# Patient Record
Sex: Female | Born: 1935 | Race: White | Hispanic: No | Marital: Single | Smoking: Never smoker
Health system: Southern US, Community
[De-identification: ages and names within clinical notes are randomized; demographics above are authoritative.]

## PROBLEM LIST (undated history)

## (undated) DIAGNOSIS — R6250 Unspecified lack of expected normal physiological development in childhood: Secondary | ICD-10-CM

## (undated) DIAGNOSIS — C801 Malignant (primary) neoplasm, unspecified: Secondary | ICD-10-CM

## (undated) DIAGNOSIS — I10 Essential (primary) hypertension: Secondary | ICD-10-CM

## (undated) DIAGNOSIS — K219 Gastro-esophageal reflux disease without esophagitis: Secondary | ICD-10-CM

## (undated) DIAGNOSIS — C50919 Malignant neoplasm of unspecified site of unspecified female breast: Secondary | ICD-10-CM

## (undated) DIAGNOSIS — E785 Hyperlipidemia, unspecified: Secondary | ICD-10-CM

## (undated) HISTORY — DX: Malignant neoplasm of unspecified site of unspecified female breast: C50.919

## (undated) HISTORY — DX: Hyperlipidemia, unspecified: E78.5

## (undated) HISTORY — DX: Essential (primary) hypertension: I10

## (undated) HISTORY — DX: Gastro-esophageal reflux disease without esophagitis: K21.9

## (undated) HISTORY — PX: SHOULDER SURGERY: SHX246

## (undated) HISTORY — PX: ABDOMINAL HYSTERECTOMY: SHX81

## (undated) HISTORY — DX: Unspecified lack of expected normal physiological development in childhood: R62.50

---

## 2007-08-02 ENCOUNTER — Ambulatory Visit: Payer: Self-pay | Admitting: Family Medicine

## 2007-08-07 ENCOUNTER — Ambulatory Visit: Payer: Self-pay | Admitting: Family Medicine

## 2007-09-18 ENCOUNTER — Ambulatory Visit: Payer: Self-pay | Admitting: Physician Assistant

## 2007-10-01 ENCOUNTER — Encounter: Payer: Self-pay | Admitting: Unknown Physician Specialty

## 2007-11-02 ENCOUNTER — Ambulatory Visit: Payer: Self-pay

## 2008-05-04 ENCOUNTER — Ambulatory Visit: Payer: Self-pay | Admitting: Internal Medicine

## 2008-06-05 ENCOUNTER — Ambulatory Visit: Payer: Self-pay | Admitting: Internal Medicine

## 2008-08-13 ENCOUNTER — Ambulatory Visit: Payer: Self-pay | Admitting: Family Medicine

## 2008-08-17 ENCOUNTER — Emergency Department: Payer: Self-pay | Admitting: Emergency Medicine

## 2008-08-31 ENCOUNTER — Ambulatory Visit: Payer: Self-pay | Admitting: Internal Medicine

## 2008-09-04 ENCOUNTER — Ambulatory Visit: Payer: Self-pay | Admitting: Family Medicine

## 2008-09-10 ENCOUNTER — Ambulatory Visit: Payer: Self-pay | Admitting: Family Medicine

## 2009-02-25 ENCOUNTER — Ambulatory Visit: Payer: Self-pay | Admitting: Gastroenterology

## 2009-03-30 ENCOUNTER — Ambulatory Visit: Payer: Self-pay | Admitting: Internal Medicine

## 2009-04-11 ENCOUNTER — Emergency Department: Payer: Self-pay | Admitting: Internal Medicine

## 2010-03-22 ENCOUNTER — Ambulatory Visit: Payer: Self-pay | Admitting: Internal Medicine

## 2010-09-10 ENCOUNTER — Ambulatory Visit: Payer: Self-pay | Admitting: Internal Medicine

## 2010-09-10 DIAGNOSIS — Z029 Encounter for administrative examinations, unspecified: Secondary | ICD-10-CM

## 2010-10-20 ENCOUNTER — Encounter: Payer: Self-pay | Admitting: Internal Medicine

## 2010-10-20 ENCOUNTER — Ambulatory Visit (INDEPENDENT_AMBULATORY_CARE_PROVIDER_SITE_OTHER): Payer: Medicare Other | Admitting: Internal Medicine

## 2010-10-20 VITALS — BP 162/101 | HR 102 | Temp 98.2°F | Resp 12 | Ht <= 58 in | Wt 85.5 lb

## 2010-10-20 DIAGNOSIS — R6250 Unspecified lack of expected normal physiological development in childhood: Secondary | ICD-10-CM

## 2010-10-20 DIAGNOSIS — D510 Vitamin B12 deficiency anemia due to intrinsic factor deficiency: Secondary | ICD-10-CM

## 2010-10-20 DIAGNOSIS — Z23 Encounter for immunization: Secondary | ICD-10-CM

## 2010-10-20 DIAGNOSIS — R269 Unspecified abnormalities of gait and mobility: Secondary | ICD-10-CM

## 2010-10-20 DIAGNOSIS — I10 Essential (primary) hypertension: Secondary | ICD-10-CM

## 2010-10-20 DIAGNOSIS — E039 Hypothyroidism, unspecified: Secondary | ICD-10-CM

## 2010-10-20 LAB — CBC WITH DIFFERENTIAL/PLATELET
Basophils Absolute: 0 10*3/uL (ref 0.0–0.1)
Eosinophils Absolute: 0.1 10*3/uL (ref 0.0–0.7)
HCT: 45.8 % (ref 36.0–46.0)
Hemoglobin: 15.5 g/dL — ABNORMAL HIGH (ref 12.0–15.0)
Lymphs Abs: 0.7 10*3/uL (ref 0.7–4.0)
MCHC: 33.9 g/dL (ref 30.0–36.0)
MCV: 102.3 fl — ABNORMAL HIGH (ref 78.0–100.0)
Monocytes Absolute: 0.6 10*3/uL (ref 0.1–1.0)
Neutro Abs: 4.7 10*3/uL (ref 1.4–7.7)
RDW: 12.9 % (ref 11.5–14.6)

## 2010-10-20 LAB — COMPREHENSIVE METABOLIC PANEL
ALT: 40 U/L — ABNORMAL HIGH (ref 0–35)
AST: 36 U/L (ref 0–37)
Creatinine, Ser: 0.7 mg/dL (ref 0.4–1.2)
GFR: 90.77 mL/min (ref >60.00)
Total Bilirubin: 1 mg/dL (ref 0.3–1.2)

## 2010-10-20 NOTE — Progress Notes (Signed)
Subjective:    Patient ID: Kathy Mckay, female    DOB: 08-05-1932, 77 y.o.   MRN: [id number]  HPI Kathy Mckay is a 77 year old female with a history of developmental delay who presents to establish care. She denies any complaints today. Her caregiver denies any complaints today. She reports good appetite and normal activity level. She denies any problems with her bowel or bladder. Her caregiver notes that she has some unsteadiness to her gait and uses her arms for balance. She requests the use of a wheelchair for traveling longer distances. She denies any recent falls.  Outpatient Encounter Prescriptions as of 10/20/2010  Medication Sig Dispense Refill  . atorvastatin (LIPITOR) 40 MG tablet Take 40 mg by mouth daily.        Marland Kitchen esomeprazole (NEXIUM) 40 MG capsule Take 40 mg by mouth daily.        . hydrochlorothiazide (HYDRODIURIL) 25 MG tablet Take 25 mg by mouth daily.        . vitamin B-12 (CYANOCOBALAMIN) 100 MCG tablet Take 50 mcg by mouth daily.          Review of Systems  Constitutional: Negative for fever, chills, appetite change, fatigue and unexpected weight change.  HENT: Negative for ear pain, congestion, sore throat, trouble swallowing, neck pain, voice change and sinus pressure.   Eyes: Negative for visual disturbance.  Respiratory: Negative for cough, shortness of breath, wheezing and stridor.   Cardiovascular: Negative for chest pain, palpitations and leg swelling.  Gastrointestinal: Negative for nausea, vomiting, abdominal pain, diarrhea, constipation, blood in stool, abdominal distention and anal bleeding.  Genitourinary: Negative for dysuria and flank pain.  Musculoskeletal: Negative for myalgias, arthralgias and gait problem.  Skin: Negative for color change and rash.  Neurological: Negative for dizziness and headaches.  Hematological: Negative for adenopathy. Does not bruise/bleed easily.  Psychiatric/Behavioral: Positive for confusion and decreased concentration. Negative  for suicidal ideas, sleep disturbance and dysphoric mood. The patient is not nervous/anxious.    BP 162/101  Pulse 102  Temp(Src) 98.2 F (36.8 C) (Oral)  Resp 12  Ht 4\' 10"  (1.473 m)  Wt 85 lb 8 oz (38.783 kg)  BMI 17.87 kg/m2  SpO2 100%     Objective:   Physical Exam  Constitutional: She is oriented to person, place, and time. She appears well-developed and well-nourished. No distress.  HENT:  Head: Normocephalic and atraumatic.  Right Ear: External ear normal.  Left Ear: External ear normal.  Nose: Nose normal.  Mouth/Throat: Oropharynx is clear and moist. No oropharyngeal exudate.  Eyes: Conjunctivae are normal. Pupils are equal, round, and reactive to light. Right eye exhibits no discharge. Left eye exhibits no discharge. No scleral icterus.  Neck: Normal range of motion. Neck supple. No tracheal deviation present. No thyromegaly present.  Cardiovascular: Normal rate, regular rhythm, normal heart sounds and intact distal pulses.  Exam reveals no gallop and no friction rub.   No murmur heard. Pulmonary/Chest: Effort normal and breath sounds normal. No respiratory distress. She has no wheezes. She has no rales. She exhibits no tenderness.  Abdominal: Soft. She exhibits no distension. There is no tenderness.  Musculoskeletal: Normal range of motion. She exhibits no edema and no tenderness.       Cervical back: She exhibits deformity (kyphosis).  Lymphadenopathy:    She has no cervical adenopathy.  Neurological: She is alert and oriented to person, place, and time. No cranial nerve deficit. She exhibits normal muscle tone. Coordination normal.  Skin: Skin is warm and  dry. No rash noted. She is not diaphoretic. No erythema. No pallor.  Psychiatric: She has a normal mood and affect. Her speech is normal and behavior is normal. Judgment and thought content normal. Cognition and memory are impaired. She exhibits abnormal recent memory and abnormal remote memory.            Assessment & Plan:  1. Hypertension -patient noted to have elevated blood pressure. We'll review her records from her previous physician. She is currently taking hydrochlorothiazide alone. We discussed adding an additional medication, but will check renal function first. She will return to clinic in 2 weeks.  2. Hyperlipidemia - Will check LFTs with labs today. Will get records on previous lipids. Continue lipitor.  3. Osteoporosis - Will get records on previous testing. Kyphosis noted on exam.   4. Developmental Delay - patient caregiver reports that patient has a legal guardian for the state. We will need to get records on this to confirm who is the decision-maker for this patient.  5. Gait disturbance - Pt is unsteady on feet. Will write for wheelchair for use as needed.

## 2010-11-04 ENCOUNTER — Encounter: Payer: Self-pay | Admitting: Internal Medicine

## 2010-11-04 ENCOUNTER — Ambulatory Visit (INDEPENDENT_AMBULATORY_CARE_PROVIDER_SITE_OTHER): Payer: Medicare Other | Admitting: Internal Medicine

## 2010-11-04 VITALS — BP 155/94 | HR 90 | Temp 97.4°F | Resp 14 | Ht <= 58 in | Wt 85.0 lb

## 2010-11-04 DIAGNOSIS — I10 Essential (primary) hypertension: Secondary | ICD-10-CM

## 2010-11-04 DIAGNOSIS — R42 Dizziness and giddiness: Secondary | ICD-10-CM

## 2010-11-04 DIAGNOSIS — D510 Vitamin B12 deficiency anemia due to intrinsic factor deficiency: Secondary | ICD-10-CM

## 2010-11-04 DIAGNOSIS — R51 Headache: Secondary | ICD-10-CM

## 2010-11-04 MED ORDER — AMLODIPINE BESYLATE 5 MG PO TABS: 5.0000 mg | ORAL_TABLET | Freq: Every day | ORAL | Status: DC

## 2010-11-04 MED ORDER — CYANOCOBALAMIN 1000 MCG/ML IJ SOLN
1000.0000 ug | Freq: Once | INTRAMUSCULAR | Status: AC
  Administered 2010-11-04: 1000 ug via INTRAMUSCULAR

## 2010-11-04 NOTE — Progress Notes (Signed)
Addended by: Jobie Quaker on: 11/04/2010 05:09 PM   Modules accepted: Orders
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
## 2010-11-04 NOTE — Patient Instructions (Signed)
Stop HCTZ. Start Amlodipine 5mg  daily. Start B12 shots monthly. Follow up in 1 month.

## 2010-11-15 ENCOUNTER — Other Ambulatory Visit: Payer: Self-pay | Admitting: *Deleted

## 2010-11-15 MED ORDER — WHEELCHAIR MISC: Status: AC

## 2010-11-15 MED ORDER — WHEELCHAIR MISC: Status: DC

## 2010-11-15 NOTE — Telephone Encounter (Signed)
Pharm is requesting RX for lightweight wheelchair. Pharm said they lost RX originally given by Dr Dan Humphreys. RX pending, need diagnosis to add, please let me know and I will complete.

## 2010-11-15 NOTE — Telephone Encounter (Signed)
Need to re-do DX for wheelchair, what is dx? Must put DX in directions of RX before sending to pharm.

## 2010-11-15 NOTE — Telephone Encounter (Signed)
Can we use Gait disturbance as the diagnosis? And, directions, use as directed?

## 2010-12-07 ENCOUNTER — Ambulatory Visit: Payer: Medicare Other | Admitting: Internal Medicine

## 2010-12-14 ENCOUNTER — Ambulatory Visit (INDEPENDENT_AMBULATORY_CARE_PROVIDER_SITE_OTHER): Payer: Medicare Other | Admitting: Internal Medicine

## 2010-12-14 ENCOUNTER — Encounter: Payer: Self-pay | Admitting: Internal Medicine

## 2010-12-14 DIAGNOSIS — R51 Headache: Secondary | ICD-10-CM

## 2010-12-14 DIAGNOSIS — Z23 Encounter for immunization: Secondary | ICD-10-CM

## 2010-12-14 DIAGNOSIS — I10 Essential (primary) hypertension: Secondary | ICD-10-CM

## 2010-12-14 DIAGNOSIS — E538 Deficiency of other specified B group vitamins: Secondary | ICD-10-CM

## 2010-12-14 MED ORDER — CYANOCOBALAMIN 1000 MCG/ML IJ SOLN
1000.0000 ug | Freq: Once | INTRAMUSCULAR | Status: AC
  Administered 2010-12-14: 1000 ug via INTRAMUSCULAR

## 2010-12-14 NOTE — Progress Notes (Signed)
Subjective:    Patient ID: Kathy Mckay, female    DOB: Nov 13, 1932, 77 y.o.   MRN: [id number]  HPI 77YO female with h/o hypertension and hyperlipidemia presents for follow up. Feeling well. No complaints today. Caregiver reports BP has been well controlled, last was 132/82 at home.  No chest pain, palpitations, lower extremity swelling.   Still having occasional HA, but improved compared to previous. Described as mild. Associated with occasional sinus congestion.  Pt is not taking any medication for this and does not wish to.  Outpatient Encounter Prescriptions as of 12/14/2010  Medication Sig Dispense Refill  . amLODipine (NORVASC) 5 MG tablet Take 1 tablet (5 mg total) by mouth daily.  30 tablet  11  . atorvastatin (LIPITOR) 40 MG tablet Take 40 mg by mouth daily.        Marland Kitchen esomeprazole (NEXIUM) 40 MG capsule Take 40 mg by mouth daily.        . vitamin B-12 (CYANOCOBALAMIN) 100 MCG tablet Take 50 mcg by mouth daily.        Marland Kitchen DISCONTD: hydrochlorothiazide (HYDRODIURIL) 25 MG tablet Take 25 mg by mouth daily.          Review of Systems  Constitutional: Negative for fever, chills, appetite change, fatigue and unexpected weight change.  HENT: Positive for congestion (occasional). Negative for ear pain, sore throat, trouble swallowing, neck pain, voice change and sinus pressure.   Eyes: Negative for visual disturbance.  Respiratory: Negative for cough, shortness of breath, wheezing and stridor.   Cardiovascular: Negative for chest pain, palpitations and leg swelling.  Gastrointestinal: Negative for nausea, vomiting, abdominal pain, diarrhea, constipation, blood in stool, abdominal distention and anal bleeding.  Genitourinary: Negative for dysuria and flank pain.  Musculoskeletal: Negative for myalgias, arthralgias and gait problem.  Skin: Negative for color change and rash.  Neurological: Positive for headaches (occasional). Negative for dizziness.  Hematological: Negative for adenopathy. Does  not bruise/bleed easily.  Psychiatric/Behavioral: Negative for suicidal ideas, sleep disturbance and dysphoric mood. The patient is not nervous/anxious.    BP 118/70  Pulse 80  Temp(Src) 97.9 F (36.6 C) (Oral)  Wt 85 lb (38.556 kg)     Objective:   Physical Exam  Constitutional: She is oriented to person, place, and time. She appears well-developed and well-nourished. No distress.  HENT:  Head: Normocephalic and atraumatic.  Right Ear: External ear normal.  Left Ear: External ear normal.  Nose: Nose normal.  Mouth/Throat: Oropharynx is clear and moist. No oropharyngeal exudate.  Eyes: Conjunctivae are normal. Pupils are equal, round, and reactive to light. Right eye exhibits no discharge. Left eye exhibits no discharge. No scleral icterus.  Neck: Normal range of motion. Neck supple. No tracheal deviation present. No thyromegaly present.  Cardiovascular: Normal rate, regular rhythm, normal heart sounds and intact distal pulses.  Exam reveals no gallop and no friction rub.   No murmur heard. Pulmonary/Chest: Effort normal and breath sounds normal. No respiratory distress. She has no wheezes. She has no rales. She exhibits no tenderness.  Musculoskeletal: Normal range of motion. She exhibits no edema and no tenderness.       Cervical back: She exhibits deformity (kyphosis).  Lymphadenopathy:    She has no cervical adenopathy.  Neurological: She is alert and oriented to person, place, and time. No cranial nerve deficit. She exhibits normal muscle tone. Coordination normal.  Skin: Skin is warm and dry. No rash noted. She is not diaphoretic. No erythema. No pallor.  Psychiatric: She has a  normal mood and affect. Her speech is normal and behavior is normal. Judgment and thought content normal. Cognition and memory are impaired. She exhibits abnormal recent memory and abnormal remote memory.          Assessment & Plan:  1. Hypertension - BP well controlled. Will continue amlodipine  alone. Follow up in 3 months.   2. Headaches - Improved. Now associated only with sinus congestion. Will continue to monitor.  3. Pernicious anemia - B12 shot today.

## 2010-12-14 NOTE — Progress Notes (Signed)
Addended by: Vernie Murders on: 12/14/2010 04:16 PM   Modules accepted: Orders

## 2010-12-15 ENCOUNTER — Ambulatory Visit: Payer: Medicare Other | Admitting: Internal Medicine

## 2010-12-27 ENCOUNTER — Telehealth: Payer: Self-pay | Admitting: *Deleted

## 2010-12-27 NOTE — Telephone Encounter (Signed)
Pt has started b-12 injections, caregiver needs order faxed to [facility] to d/c oral b12. Done, Corrie Dandy (caregiver) informed.

## 2011-01-17 ENCOUNTER — Ambulatory Visit (INDEPENDENT_AMBULATORY_CARE_PROVIDER_SITE_OTHER): Payer: Medicare Other | Admitting: *Deleted

## 2011-01-17 DIAGNOSIS — D510 Vitamin B12 deficiency anemia due to intrinsic factor deficiency: Secondary | ICD-10-CM

## 2011-01-17 MED ORDER — CYANOCOBALAMIN 1000 MCG/ML IJ SOLN
1000.0000 ug | Freq: Once | INTRAMUSCULAR | Status: AC
  Administered 2011-01-17: 1000 ug via INTRAMUSCULAR

## 2011-01-25 ENCOUNTER — Telehealth: Payer: Self-pay | Admitting: *Deleted

## 2011-01-25 NOTE — Telephone Encounter (Signed)
Caregiver, Corrie Dandy is requesting a call to discuss pt's med list.

## 2011-01-26 ENCOUNTER — Telehealth: Payer: Self-pay | Admitting: *Deleted

## 2011-01-26 NOTE — Telephone Encounter (Signed)
Spoke w/[name] in office today and corrected med list

## 2011-01-26 NOTE — Telephone Encounter (Signed)
Med list update

## 2011-01-28 ENCOUNTER — Ambulatory Visit: Payer: Medicare Other | Admitting: Internal Medicine

## 2011-02-16 ENCOUNTER — Telehealth: Payer: Self-pay | Admitting: *Deleted

## 2011-02-16 NOTE — Telephone Encounter (Signed)
ICD 9 codes provided to caregiver for paperwork

## 2011-02-18 ENCOUNTER — Other Ambulatory Visit: Payer: Self-pay | Admitting: *Deleted

## 2011-02-18 MED ORDER — ESOMEPRAZOLE MAGNESIUM 40 MG PO CPDR: 40.0000 mg | DELAYED_RELEASE_CAPSULE | Freq: Every day | ORAL | Status: DC

## 2011-02-18 MED ORDER — ATORVASTATIN CALCIUM 40 MG PO TABS: 40.0000 mg | ORAL_TABLET | Freq: Every day | ORAL | Status: DC

## 2011-02-21 ENCOUNTER — Ambulatory Visit: Payer: Self-pay | Admitting: Internal Medicine

## 2011-02-21 ENCOUNTER — Ambulatory Visit: Payer: Medicare Other

## 2011-02-21 ENCOUNTER — Ambulatory Visit (INDEPENDENT_AMBULATORY_CARE_PROVIDER_SITE_OTHER): Payer: Medicare Other | Admitting: *Deleted

## 2011-02-21 DIAGNOSIS — D510 Vitamin B12 deficiency anemia due to intrinsic factor deficiency: Secondary | ICD-10-CM

## 2011-02-21 MED ORDER — CYANOCOBALAMIN 1000 MCG/ML IJ SOLN
1000.0000 ug | Freq: Once | INTRAMUSCULAR | Status: AC
  Administered 2011-02-21: 1000 ug via INTRAMUSCULAR

## 2011-03-01 ENCOUNTER — Ambulatory Visit (INDEPENDENT_AMBULATORY_CARE_PROVIDER_SITE_OTHER): Payer: Medicare Other | Admitting: Internal Medicine

## 2011-03-01 ENCOUNTER — Encounter: Payer: Self-pay | Admitting: Internal Medicine

## 2011-03-01 VITALS — BP 150/96 | HR 98 | Temp 97.6°F | Wt 94.0 lb

## 2011-03-01 DIAGNOSIS — I10 Essential (primary) hypertension: Secondary | ICD-10-CM

## 2011-03-01 DIAGNOSIS — F329 Major depressive disorder, single episode, unspecified: Secondary | ICD-10-CM | POA: Insufficient documentation

## 2011-03-01 DIAGNOSIS — M25519 Pain in unspecified shoulder: Secondary | ICD-10-CM

## 2011-03-01 DIAGNOSIS — F3289 Other specified depressive episodes: Secondary | ICD-10-CM

## 2011-03-01 DIAGNOSIS — M25512 Pain in left shoulder: Secondary | ICD-10-CM

## 2011-03-01 MED ORDER — MIRTAZAPINE 15 MG PO TABS: 15.0000 mg | ORAL_TABLET | Freq: Every day | ORAL | Status: DC

## 2011-03-01 MED ORDER — HYDROCODONE-ACETAMINOPHEN 5-500 MG PO TABS: 1.0000 | ORAL_TABLET | Freq: Three times a day (TID) | ORAL | Status: AC | PRN

## 2011-03-01 MED ORDER — AMLODIPINE BESYLATE 10 MG PO TABS: 10.0000 mg | ORAL_TABLET | Freq: Every day | ORAL | Status: DC
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
## 2011-03-01 NOTE — Assessment & Plan Note (Signed)
Secondary to OA. Reluctant to give steroids today because of HTN.  Will start vicodin prn severe pain. Will set up with orthopedics for evaluation.

## 2011-03-01 NOTE — Assessment & Plan Note (Signed)
Per nursing, BP has been elevated. Will increase dose of Amlodipine to 10mg  daily. Follow up in 1 month or sooner as needed. Nursing will continue to monitor daily BP.
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
## 2011-03-15 ENCOUNTER — Encounter: Payer: Self-pay | Admitting: Internal Medicine

## 2011-03-15 ENCOUNTER — Ambulatory Visit (INDEPENDENT_AMBULATORY_CARE_PROVIDER_SITE_OTHER): Payer: Medicaid Other | Admitting: Internal Medicine

## 2011-03-15 DIAGNOSIS — M25512 Pain in left shoulder: Secondary | ICD-10-CM

## 2011-03-15 DIAGNOSIS — F329 Major depressive disorder, single episode, unspecified: Secondary | ICD-10-CM

## 2011-03-15 DIAGNOSIS — I10 Essential (primary) hypertension: Secondary | ICD-10-CM

## 2011-03-15 DIAGNOSIS — M25519 Pain in unspecified shoulder: Secondary | ICD-10-CM

## 2011-03-15 DIAGNOSIS — D510 Vitamin B12 deficiency anemia due to intrinsic factor deficiency: Secondary | ICD-10-CM

## 2011-03-15 DIAGNOSIS — M810 Age-related osteoporosis without current pathological fracture: Secondary | ICD-10-CM | POA: Insufficient documentation

## 2011-03-15 LAB — CBC WITH DIFFERENTIAL/PLATELET
Basophils Absolute: 0 10*3/uL (ref 0.0–0.1)
Eosinophils Relative: 0.2 % (ref 0.0–5.0)
HCT: 49 % — ABNORMAL HIGH (ref 36.0–46.0)
Hemoglobin: 16.4 g/dL — ABNORMAL HIGH (ref 12.0–15.0)
Lymphs Abs: 0.9 10*3/uL (ref 0.7–4.0)
MCV: 101.7 fl — ABNORMAL HIGH (ref 78.0–100.0)
Monocytes Relative: 8.7 % (ref 3.0–12.0)
Neutro Abs: 6.7 10*3/uL (ref 1.4–7.7)
RDW: 13 % (ref 11.5–14.6)

## 2011-03-15 LAB — COMPREHENSIVE METABOLIC PANEL
ALT: 35 U/L (ref 0–35)
Alkaline Phosphatase: 117 U/L (ref 39–117)
CO2: 21 mEq/L (ref 19–32)
Creatinine, Ser: 0.9 mg/dL (ref 0.4–1.2)
GFR: 64.5 mL/min (ref >60.00)
Total Bilirubin: 0.8 mg/dL (ref 0.3–1.2)

## 2011-03-15 NOTE — Assessment & Plan Note (Signed)
Secondary to OA. Status post steroid injection with minimal improvement. We'll continue to monitor. Followup 6 months.

## 2011-03-15 NOTE — Assessment & Plan Note (Signed)
Symptoms improved with use of Remeron. Will continue. Followup in 6 months.

## 2011-03-15 NOTE — Assessment & Plan Note (Signed)
Continue B12 shots.  

## 2011-03-15 NOTE — Assessment & Plan Note (Signed)
Patient has history of osteoporosis. Will get DEXA scan for further evaluation.

## 2011-03-15 NOTE — Progress Notes (Signed)
Subjective:    Patient ID: Kathy Mckay, female    DOB: 11/16/1932, 78 y.o.   MRN: [id number]  HPI 78 year old female with history of hypertension, depression, pernicious anemia presents for followup. Her caregiver reports she is doing much better in terms of her depressed mood since starting Remeron. She has only had one episode of crying since her last visit one month ago. She has been more interactive and has had a good appetite. Her caregiver denies any fever, chills, nausea, vomiting, diarrhea, or other concerns. She continues to have left shoulder pain which is slightly improved after steroid injection with orthopedic physician.  Outpatient Encounter Prescriptions as of 03/15/2011  Medication Sig Dispense Refill  . amLODipine (NORVASC) 10 MG tablet Take 1 tablet (10 mg total) by mouth daily.  30 tablet  11  . atorvastatin (LIPITOR) 40 MG tablet Take 1 tablet (40 mg total) by mouth daily.  90 tablet  2  . cyanocobalamin (,VITAMIN B-12,) 1000 MCG/ML injection Inject 1,000 mcg into the muscle every 30 (thirty) days.       Marland Kitchen esomeprazole (NEXIUM) 40 MG capsule Take 1 capsule (40 mg total) by mouth daily.  90 capsule  1  . mirtazapine (REMERON) 15 MG tablet Take 1 tablet (15 mg total) by mouth at bedtime.  30 tablet  3    Review of Systems  Constitutional: Negative for fever, chills, appetite change, fatigue and unexpected weight change.  HENT: Negative for ear pain, congestion, sore throat, trouble swallowing, neck pain, voice change and sinus pressure.   Eyes: Negative for visual disturbance.  Respiratory: Negative for cough, shortness of breath, wheezing and stridor.   Cardiovascular: Negative for chest pain, palpitations and leg swelling.  Gastrointestinal: Negative for nausea, vomiting, abdominal pain, diarrhea, constipation, blood in stool, abdominal distention and anal bleeding.  Genitourinary: Negative for dysuria and flank pain.  Musculoskeletal: Positive for arthralgias. Negative for  myalgias and gait problem.  Skin: Negative for color change and rash.  Neurological: Negative for dizziness and headaches.  Hematological: Negative for adenopathy. Does not bruise/bleed easily.  Psychiatric/Behavioral: Positive for dysphoric mood. Negative for suicidal ideas and sleep disturbance. The patient is not nervous/anxious.    BP 132/86  Pulse 117  Temp(Src) 98 F (36.7 C) (Oral)  Ht 4\' 10"  (1.473 m)  Wt 90 lb (40.824 kg)  BMI 18.81 kg/m2  SpO2 96%     Objective:   Physical Exam  Constitutional: She is oriented to person, place, and time. She appears well-developed and well-nourished. No distress.  HENT:  Head: Normocephalic and atraumatic.  Right Ear: External ear normal.  Left Ear: External ear normal.  Nose: Nose normal.  Mouth/Throat: Oropharynx is clear and moist. No oropharyngeal exudate.  Eyes: Conjunctivae are normal. Pupils are equal, round, and reactive to light. Right eye exhibits no discharge. Left eye exhibits no discharge. No scleral icterus.  Neck: Normal range of motion. Neck supple. No tracheal deviation present. No thyromegaly present.  Cardiovascular: Normal rate, regular rhythm, normal heart sounds and intact distal pulses.  Exam reveals no gallop and no friction rub.   No murmur heard. Pulmonary/Chest: Effort normal and breath sounds normal. No respiratory distress. She has no wheezes. She has no rales. She exhibits no tenderness.  Musculoskeletal: Normal range of motion. She exhibits no edema and no tenderness.  Lymphadenopathy:    She has no cervical adenopathy.  Neurological: She is alert and oriented to person, place, and time. No cranial nerve deficit. She exhibits normal muscle tone.  Coordination normal.  Skin: Skin is warm and dry. No rash noted. She is not diaphoretic. No erythema. No pallor.  Psychiatric: She has a normal mood and affect. Her behavior is normal. Judgment and thought content normal.          Assessment & Plan:

## 2011-03-15 NOTE — Assessment & Plan Note (Signed)
Blood pressure improved with increase dose of amlodipine. Will check renal function with labs today. Followup in 6 months.

## 2011-03-23 ENCOUNTER — Ambulatory Visit: Payer: Self-pay | Admitting: Internal Medicine

## 2011-03-25 ENCOUNTER — Telehealth: Payer: Self-pay | Admitting: Internal Medicine

## 2011-03-25 NOTE — Telephone Encounter (Signed)
Bone density was very low on testing. Recommend starting Prolia asap and recheck in 1 year.

## 2011-03-29 ENCOUNTER — Encounter: Payer: Self-pay | Admitting: Internal Medicine

## 2011-03-30 NOTE — Telephone Encounter (Signed)
Mess sent to [facility] to run benefits.

## 2011-05-17 ENCOUNTER — Other Ambulatory Visit: Payer: Self-pay | Admitting: *Deleted

## 2011-05-17 MED ORDER — CYANOCOBALAMIN 1000 MCG/ML IJ SOLN: 1000.0000 ug | INTRAMUSCULAR | Status: DC

## 2011-06-14 ENCOUNTER — Other Ambulatory Visit: Payer: Self-pay | Admitting: *Deleted

## 2011-06-14 DIAGNOSIS — F329 Major depressive disorder, single episode, unspecified: Secondary | ICD-10-CM

## 2011-06-14 MED ORDER — MIRTAZAPINE 15 MG PO TABS: 15.0000 mg | ORAL_TABLET | Freq: Every day | ORAL | Status: DC

## 2011-09-14 ENCOUNTER — Other Ambulatory Visit: Payer: Self-pay | Admitting: *Deleted

## 2011-09-14 MED ORDER — ESOMEPRAZOLE MAGNESIUM 40 MG PO CPDR: 40.0000 mg | DELAYED_RELEASE_CAPSULE | Freq: Every day | ORAL | Status: DC

## 2011-10-17 ENCOUNTER — Telehealth: Payer: Self-pay | Admitting: Internal Medicine

## 2011-10-17 ENCOUNTER — Other Ambulatory Visit: Payer: Self-pay | Admitting: *Deleted

## 2011-10-17 DIAGNOSIS — F329 Major depressive disorder, single episode, unspecified: Secondary | ICD-10-CM

## 2011-10-17 MED ORDER — MIRTAZAPINE 15 MG PO TABS: 15.0000 mg | ORAL_TABLET | Freq: Every day | ORAL | Status: DC

## 2011-10-17 NOTE — Telephone Encounter (Signed)
Fine to fill. 

## 2011-10-17 NOTE — Telephone Encounter (Signed)
Refill reqeust for mirtazapine 15 mg tab #30 Sig: take 1 tablet by mouth at bedtime

## 2011-11-09 ENCOUNTER — Ambulatory Visit: Payer: 59 | Admitting: Internal Medicine

## 2011-11-09 ENCOUNTER — Ambulatory Visit (INDEPENDENT_AMBULATORY_CARE_PROVIDER_SITE_OTHER): Payer: 59 | Admitting: Internal Medicine

## 2011-11-09 ENCOUNTER — Encounter: Payer: Self-pay | Admitting: Internal Medicine

## 2011-11-09 DIAGNOSIS — Z23 Encounter for immunization: Secondary | ICD-10-CM

## 2011-11-10 NOTE — Progress Notes (Signed)
  Subjective:    Patient ID: Kathy Mckay, female    DOB: 09-17-1932, 79 y.o.   MRN: [id number]  HPI  Visit canceled   Review of Systems     Objective:   Physical Exam        Assessment & Plan:

## 2011-11-18 ENCOUNTER — Other Ambulatory Visit: Payer: Self-pay | Admitting: *Deleted

## 2011-11-18 MED ORDER — ATORVASTATIN CALCIUM 40 MG PO TABS: 40.0000 mg | ORAL_TABLET | Freq: Every day | ORAL | Status: DC

## 2011-12-13 ENCOUNTER — Ambulatory Visit: Payer: 59 | Admitting: Internal Medicine

## 2011-12-26 ENCOUNTER — Encounter: Payer: Self-pay | Admitting: Internal Medicine

## 2011-12-26 ENCOUNTER — Ambulatory Visit (INDEPENDENT_AMBULATORY_CARE_PROVIDER_SITE_OTHER): Payer: 59 | Admitting: Internal Medicine

## 2011-12-26 VITALS — BP 140/86 | HR 92 | Temp 97.6°F | Resp 15 | Wt 93.8 lb

## 2011-12-26 DIAGNOSIS — F329 Major depressive disorder, single episode, unspecified: Secondary | ICD-10-CM

## 2011-12-26 DIAGNOSIS — D510 Vitamin B12 deficiency anemia due to intrinsic factor deficiency: Secondary | ICD-10-CM

## 2011-12-26 DIAGNOSIS — E039 Hypothyroidism, unspecified: Secondary | ICD-10-CM

## 2011-12-26 DIAGNOSIS — I10 Essential (primary) hypertension: Secondary | ICD-10-CM

## 2011-12-26 DIAGNOSIS — M1990 Unspecified osteoarthritis, unspecified site: Secondary | ICD-10-CM | POA: Insufficient documentation

## 2011-12-26 LAB — COMPREHENSIVE METABOLIC PANEL
ALT: 47 U/L — ABNORMAL HIGH (ref 0–35)
AST: 40 U/L — ABNORMAL HIGH (ref 0–37)
Albumin: 4.7 g/dL (ref 3.5–5.2)
Alkaline Phosphatase: 155 U/L — ABNORMAL HIGH (ref 39–117)
BUN: 24 mg/dL — ABNORMAL HIGH (ref 6–23)
CO2: 20 mEq/L (ref 19–32)
Calcium: 10 mg/dL (ref 8.4–10.5)
Chloride: 100 mEq/L (ref 96–112)
Creatinine, Ser: 0.8 mg/dL (ref 0.4–1.2)
GFR: 74.82 mL/min (ref >60.00)
Glucose, Bld: 192 mg/dL — ABNORMAL HIGH (ref 70–99)
Potassium: 3.2 mEq/L — ABNORMAL LOW (ref 3.5–5.1)
Sodium: 136 mEq/L (ref 135–145)
Total Bilirubin: 1.2 mg/dL (ref 0.3–1.2)
Total Protein: 8.3 g/dL (ref 6.0–8.3)

## 2011-12-26 LAB — CBC WITH DIFFERENTIAL/PLATELET
Basophils Relative: 0.2 % (ref 0.0–3.0)
Eosinophils Absolute: 0 10*3/uL (ref 0.0–0.7)
Hemoglobin: 15.6 g/dL — ABNORMAL HIGH (ref 12.0–15.0)
Lymphs Abs: 0.9 10*3/uL (ref 0.7–4.0)
MCHC: 34.1 g/dL (ref 30.0–36.0)
MCV: 97.9 fl (ref 78.0–100.0)
Monocytes Absolute: 0.8 10*3/uL (ref 0.1–1.0)
Neutro Abs: 7.6 10*3/uL (ref 1.4–7.7)
RBC: 4.66 Mil/uL (ref 3.87–5.11)

## 2011-12-26 LAB — VITAMIN B12: Vitamin B-12: 533 pg/mL (ref 211–911)

## 2011-12-26 MED ORDER — MIRTAZAPINE 30 MG PO TABS: 30.0000 mg | ORAL_TABLET | Freq: Every day | ORAL | Status: DC

## 2011-12-26 MED ORDER — MELOXICAM 15 MG PO TABS: 15.0000 mg | ORAL_TABLET | Freq: Every day | ORAL | Status: DC | PRN

## 2011-12-26 NOTE — Assessment & Plan Note (Signed)
Will check TSH with labs today. Follow up 3 months or sooner as needed.

## 2011-12-26 NOTE — Assessment & Plan Note (Signed)
Symptoms of pain have recently worsened. Not controlled with Tylenol. Will try adding meloxicam as needed. Followup in 3 months or sooner as needed.

## 2011-12-26 NOTE — Progress Notes (Signed)
Subjective:    Patient ID: Kathy Mckay, female    DOB: Oct 29, 1932, 78 y.o.   MRN: [id number]  HPI 78 year old female with history of hypertension, depression, osteoarthritis presents for followup. Her primary concern today is worsening of her arthritis pain. The pain is described as aching in her joints. It is worsened with the colder weather. She has been taking Tylenol with minimal improvement.  Her caregiver is also concerned about worsening depression. She reports she has been taking Remeron 15 mg at bedtime but is often still frequently tearful during the day.  In regards to hypertension, her caregiver reports that blood pressure when checked by home health nurse has been normal.  Outpatient Prescriptions Prior to Visit  Medication Sig Dispense Refill  . amLODipine (NORVASC) 10 MG tablet Take 1 tablet (10 mg total) by mouth daily.  30 tablet  11  . atorvastatin (LIPITOR) 40 MG tablet Take 1 tablet (40 mg total) by mouth daily.  90 tablet  3  . esomeprazole (NEXIUM) 40 MG capsule Take 1 capsule (40 mg total) by mouth daily.  90 capsule  1  . [DISCONTINUED] cyanocobalamin (,VITAMIN B-12,) 1000 MCG/ML injection Inject 1 mL (1,000 mcg total) into the muscle every 30 (thirty) days.  1 mL  4  . [DISCONTINUED] mirtazapine (REMERON) 15 MG tablet Take 1 tablet (15 mg total) by mouth at bedtime.  30 tablet  6   Last reviewed on 12/26/2011 11:02 AM by Shelia Media, MD  BP 140/86  Pulse 92  Temp 97.6 F (36.4 C) (Oral)  Resp 15  Wt 93 lb 12 oz (42.525 kg)  Review of Systems  Constitutional: Negative for fever, chills, appetite change, fatigue and unexpected weight change.  HENT: Negative for ear pain, congestion, sore throat, trouble swallowing, neck pain, voice change and sinus pressure.   Eyes: Negative for visual disturbance.  Respiratory: Negative for cough, shortness of breath, wheezing and stridor.   Cardiovascular: Negative for chest pain, palpitations and leg swelling.   Gastrointestinal: Negative for nausea, vomiting, abdominal pain, diarrhea, constipation, blood in stool, abdominal distention and anal bleeding.  Genitourinary: Negative for dysuria and flank pain.  Musculoskeletal: Positive for arthralgias. Negative for myalgias and gait problem.  Skin: Negative for color change and rash.  Neurological: Negative for dizziness and headaches.  Hematological: Negative for adenopathy. Does not bruise/bleed easily.  Psychiatric/Behavioral: Positive for dysphoric mood. Negative for suicidal ideas and sleep disturbance. The patient is not nervous/anxious.        Objective:   Physical Exam  Constitutional: She is oriented to person, place, and time. She appears well-developed and well-nourished. No distress.  HENT:  Head: Normocephalic and atraumatic.  Right Ear: External ear normal.  Left Ear: External ear normal.  Nose: Nose normal.  Mouth/Throat: Oropharynx is clear and moist. No oropharyngeal exudate.  Eyes: Conjunctivae normal are normal. Pupils are equal, round, and reactive to light. Right eye exhibits no discharge. Left eye exhibits no discharge. No scleral icterus.  Neck: Normal range of motion. Neck supple. No tracheal deviation present. No thyromegaly present.  Cardiovascular: Normal rate, regular rhythm, normal heart sounds and intact distal pulses.  Exam reveals no gallop and no friction rub.   No murmur heard. Pulmonary/Chest: Effort normal and breath sounds normal. No respiratory distress. She has no wheezes. She has no rales. She exhibits no tenderness.  Musculoskeletal: Normal range of motion. She exhibits no edema and no tenderness.  Lymphadenopathy:    She has no cervical adenopathy.  Neurological:  She is alert and oriented to person, place, and time. No cranial nerve deficit. She exhibits normal muscle tone. Coordination normal.  Skin: Skin is warm and dry. No rash noted. She is not diaphoretic. No erythema. No pallor.  Psychiatric: She  has a normal mood and affect. Her behavior is normal. Judgment and thought content normal.          Assessment & Plan:

## 2011-12-26 NOTE — Assessment & Plan Note (Signed)
Patient is frequently tearful. Will try increasing her Remeron to 30 mg daily. Followup in 3 months or sooner as needed.

## 2011-12-26 NOTE — Assessment & Plan Note (Signed)
Blood pressure has been well controlled. Will continue to monitor through home health nurse. Will check renal function with labs today. Followup 3 months or sooner if needed.

## 2011-12-26 NOTE — Assessment & Plan Note (Signed)
Patient has not been taking B12 supplement. Will check B12 with labs today.

## 2011-12-27 MED ORDER — POTASSIUM CHLORIDE CRYS ER 20 MEQ PO TBCR: 20.0000 meq | EXTENDED_RELEASE_TABLET | Freq: Every day | ORAL | Status: DC

## 2011-12-27 NOTE — Addendum Note (Signed)
Addended by: Jackson Latino on: 12/27/2011 09:03 AM   Modules accepted: Orders

## 2012-01-02 ENCOUNTER — Other Ambulatory Visit (INDEPENDENT_AMBULATORY_CARE_PROVIDER_SITE_OTHER): Payer: 59

## 2012-01-02 ENCOUNTER — Telehealth: Payer: Self-pay | Admitting: *Deleted

## 2012-01-02 DIAGNOSIS — I10 Essential (primary) hypertension: Secondary | ICD-10-CM

## 2012-01-02 LAB — COMPREHENSIVE METABOLIC PANEL WITH GFR
ALT: 35 U/L (ref 0–35)
AST: 32 U/L (ref 0–37)
Albumin: 4.2 g/dL (ref 3.5–5.2)
Alkaline Phosphatase: 140 U/L — ABNORMAL HIGH (ref 39–117)
BUN: 26 mg/dL — ABNORMAL HIGH (ref 6–23)
CO2: 22 meq/L (ref 19–32)
Calcium: 9.6 mg/dL (ref 8.4–10.5)
Chloride: 104 meq/L (ref 96–112)
Creatinine, Ser: 0.9 mg/dL (ref 0.4–1.2)
GFR: 66.06 mL/min
Glucose, Bld: 135 mg/dL — ABNORMAL HIGH (ref 70–99)
Potassium: 3.7 meq/L (ref 3.5–5.1)
Sodium: 138 meq/L (ref 135–145)
Total Bilirubin: 0.9 mg/dL (ref 0.3–1.2)
Total Protein: 7.4 g/dL (ref 6.0–8.3)

## 2012-01-02 NOTE — Telephone Encounter (Signed)
What labs and dx would you like for this pt? Thank You

## 2012-01-02 NOTE — Telephone Encounter (Signed)
CMP

## 2012-01-03 ENCOUNTER — Other Ambulatory Visit: Payer: Self-pay | Admitting: General Practice

## 2012-01-03 MED ORDER — POTASSIUM CHLORIDE ER 10 MEQ PO TBCR: 10.0000 meq | EXTENDED_RELEASE_TABLET | Freq: Every day | ORAL | Status: DC

## 2012-01-12 ENCOUNTER — Other Ambulatory Visit: Payer: Self-pay | Admitting: Internal Medicine

## 2012-01-12 DIAGNOSIS — I10 Essential (primary) hypertension: Secondary | ICD-10-CM

## 2012-01-12 NOTE — Telephone Encounter (Signed)
Amlodipine Besylate 10 mg tab   Take 1 tablet daily  # 30

## 2012-01-16 MED ORDER — AMLODIPINE BESYLATE 10 MG PO TABS: 10.0000 mg | ORAL_TABLET | Freq: Every day | ORAL | Status: DC

## 2012-01-16 NOTE — Telephone Encounter (Signed)
Med filled.  

## 2012-02-09 ENCOUNTER — Other Ambulatory Visit: Payer: Self-pay | Admitting: *Deleted

## 2012-02-09 MED ORDER — ESOMEPRAZOLE MAGNESIUM 40 MG PO CPDR: 40.0000 mg | DELAYED_RELEASE_CAPSULE | Freq: Every day | ORAL | Status: DC

## 2012-03-27 ENCOUNTER — Encounter: Payer: Self-pay | Admitting: Internal Medicine

## 2012-03-27 ENCOUNTER — Ambulatory Visit (INDEPENDENT_AMBULATORY_CARE_PROVIDER_SITE_OTHER): Payer: 59 | Admitting: Internal Medicine

## 2012-03-27 VITALS — BP 152/90 | HR 80 | Temp 97.5°F | Wt 100.0 lb

## 2012-03-27 DIAGNOSIS — M810 Age-related osteoporosis without current pathological fracture: Secondary | ICD-10-CM

## 2012-03-27 DIAGNOSIS — I10 Essential (primary) hypertension: Secondary | ICD-10-CM

## 2012-03-27 DIAGNOSIS — R32 Unspecified urinary incontinence: Secondary | ICD-10-CM | POA: Insufficient documentation

## 2012-03-27 DIAGNOSIS — E039 Hypothyroidism, unspecified: Secondary | ICD-10-CM

## 2012-03-27 LAB — COMPREHENSIVE METABOLIC PANEL
AST: 35 U/L (ref 0–37)
Albumin: 4.3 g/dL (ref 3.5–5.2)
BUN: 24 mg/dL — ABNORMAL HIGH (ref 6–23)
Calcium: 9.4 mg/dL (ref 8.4–10.5)
Chloride: 99 mEq/L (ref 96–112)
Glucose, Bld: 194 mg/dL — ABNORMAL HIGH (ref 70–99)
Potassium: 3.6 mEq/L (ref 3.5–5.1)

## 2012-03-27 LAB — TSH: TSH: 3.03 u[IU]/mL (ref 0.35–5.50)

## 2012-03-27 NOTE — Assessment & Plan Note (Signed)
TSH normal today with labs. Continue levothyroxine.

## 2012-03-27 NOTE — Assessment & Plan Note (Signed)
Last T-Score -5.6 in 2013. Pt has been unable to tolerate bisphosphonates per caregiver because of GERD. Will look into insurance coverage for Prolia. Will check Vit D with labs today.

## 2012-03-27 NOTE — Assessment & Plan Note (Signed)
Chronic urinary incontinence. Uses Diapers to help control leakage. Will continue.

## 2012-03-27 NOTE — Assessment & Plan Note (Signed)
BP Readings from Last 3 Encounters:  03/27/12 152/90  12/26/11 140/86  03/15/11 132/86   BP fairly well controlled on current medications, better controlled at home. Renal function stable. Continue current medications. Follow up 3 months and prn.

## 2012-03-27 NOTE — Progress Notes (Signed)
Subjective:    Patient ID: Kathy Mckay, female    DOB: 1932/03/28, 79 y.o.   MRN: [id number]  HPI 79YO female with h/o hypertension, osteoporosis, chronic urinary incontinence, depression presents for follow up. Doing well. Appetite excellent. Continues to live in a group home with assisted care. No new concerns per pt or caregiver today.  Outpatient Encounter Prescriptions as of 03/27/2012  Medication Sig Dispense Refill  . amLODipine (NORVASC) 10 MG tablet Take 1 tablet (10 mg total) by mouth daily.  30 tablet  11  . atorvastatin (LIPITOR) 40 MG tablet Take 1 tablet (40 mg total) by mouth daily.  90 tablet  3  . esomeprazole (NEXIUM) 40 MG capsule Take 1 capsule (40 mg total) by mouth daily.  90 capsule  1  . meloxicam (MOBIC) 15 MG tablet Take 1 tablet (15 mg total) by mouth daily as needed for pain.  30 tablet  3  . mirtazapine (REMERON) 30 MG tablet Take 1 tablet (30 mg total) by mouth at bedtime.  30 tablet  6  . potassium chloride (K-DUR) 10 MEQ tablet Take 1 tablet (10 mEq total) by mouth daily.  30 tablet  0  . [DISCONTINUED] potassium chloride SA (K-DUR,KLOR-CON) 20 MEQ tablet        No facility-administered encounter medications on file as of 03/27/2012.   BP 152/90  Pulse 80  Temp(Src) 97.5 F (36.4 C) (Oral)  Wt 100 lb (45.36 kg)  BMI 20.91 kg/m2  SpO2 92%  Review of Systems  Constitutional: Negative for fever, chills, appetite change, fatigue and unexpected weight change.  HENT: Negative for ear pain, congestion, sore throat, trouble swallowing, neck pain, voice change and sinus pressure.   Eyes: Negative for visual disturbance.  Respiratory: Negative for cough, shortness of breath, wheezing and stridor.   Cardiovascular: Negative for chest pain, palpitations and leg swelling.  Gastrointestinal: Negative for nausea, vomiting, abdominal pain, diarrhea, constipation, blood in stool, abdominal distention and anal bleeding.  Genitourinary: Negative for dysuria and flank  pain.  Musculoskeletal: Negative for myalgias, arthralgias and gait problem.  Skin: Negative for color change and rash.  Neurological: Negative for dizziness and headaches.  Hematological: Negative for adenopathy. Does not bruise/bleed easily.  Psychiatric/Behavioral: Negative for suicidal ideas, sleep disturbance and dysphoric mood. The patient is not nervous/anxious.        Objective:   Physical Exam  Constitutional: She is oriented to person, place, and time. She appears well-developed and well-nourished. No distress.  HENT:  Head: Normocephalic and atraumatic.  Right Ear: External ear normal.  Left Ear: External ear normal.  Nose: Nose normal.  Mouth/Throat: Oropharynx is clear and moist. No oropharyngeal exudate.  Eyes: Conjunctivae are normal. Pupils are equal, round, and reactive to light. Right eye exhibits no discharge. Left eye exhibits no discharge. No scleral icterus.  Neck: Normal range of motion. Neck supple. No tracheal deviation present. No thyromegaly present.  Cardiovascular: Normal rate, regular rhythm, normal heart sounds and intact distal pulses.  Exam reveals no gallop and no friction rub.   No murmur heard. Pulmonary/Chest: Effort normal and breath sounds normal. No respiratory distress. She has no wheezes. She has no rales. She exhibits no tenderness.  Musculoskeletal: Normal range of motion. She exhibits no edema and no tenderness.  Lymphadenopathy:    She has no cervical adenopathy.  Neurological: She is alert and oriented to person, place, and time. No cranial nerve deficit. She exhibits normal muscle tone. Coordination normal.  Skin: Skin is warm and  dry. No rash noted. She is not diaphoretic. No erythema. No pallor.  Psychiatric: She has a normal mood and affect. Her speech is normal and behavior is normal. Judgment and thought content normal. She exhibits abnormal recent memory.          Assessment & Plan:

## 2012-03-28 LAB — VITAMIN D 25 HYDROXY (VIT D DEFICIENCY, FRACTURES): Vit D, 25-Hydroxy: 37 ng/mL (ref 30–89)

## 2012-06-06 ENCOUNTER — Other Ambulatory Visit: Payer: Self-pay | Admitting: *Deleted

## 2012-06-06 DIAGNOSIS — F329 Major depressive disorder, single episode, unspecified: Secondary | ICD-10-CM

## 2012-06-06 MED ORDER — MIRTAZAPINE 30 MG PO TABS: 30.0000 mg | ORAL_TABLET | Freq: Every day | ORAL | Status: DC

## 2012-06-06 NOTE — Telephone Encounter (Signed)
Eprescribed.

## 2012-07-18 ENCOUNTER — Ambulatory Visit: Payer: 59 | Admitting: Internal Medicine

## 2012-08-07 ENCOUNTER — Other Ambulatory Visit: Payer: Self-pay | Admitting: *Deleted

## 2012-08-07 MED ORDER — ESOMEPRAZOLE MAGNESIUM 40 MG PO CPDR: 40.0000 mg | DELAYED_RELEASE_CAPSULE | Freq: Every day | ORAL | Status: DC

## 2012-08-07 NOTE — Telephone Encounter (Signed)
Eprescribed.

## 2012-08-22 ENCOUNTER — Ambulatory Visit: Payer: 59 | Admitting: Internal Medicine

## 2012-10-30 ENCOUNTER — Telehealth: Payer: Self-pay | Admitting: Internal Medicine

## 2012-10-30 NOTE — Telephone Encounter (Signed)
Called with questions regarding pt Medicaid insurance.  States pt told them we were unable to schedule her for an appointment.  Advised if Medicaid is [place] and is secondary, she can be scheduled.

## 2012-11-07 ENCOUNTER — Other Ambulatory Visit: Payer: Self-pay | Admitting: *Deleted

## 2012-11-07 DIAGNOSIS — F329 Major depressive disorder, single episode, unspecified: Secondary | ICD-10-CM

## 2012-11-07 MED ORDER — MIRTAZAPINE 30 MG PO TABS: 30.0000 mg | ORAL_TABLET | Freq: Every day | ORAL | Status: DC

## 2012-11-07 MED ORDER — ATORVASTATIN CALCIUM 40 MG PO TABS: 40.0000 mg | ORAL_TABLET | Freq: Every day | ORAL | Status: DC

## 2012-11-07 MED ORDER — ESOMEPRAZOLE MAGNESIUM 40 MG PO CPDR: 40.0000 mg | DELAYED_RELEASE_CAPSULE | Freq: Every day | ORAL | Status: DC

## 2012-11-19 ENCOUNTER — Encounter: Payer: Self-pay | Admitting: Internal Medicine

## 2012-11-19 ENCOUNTER — Ambulatory Visit (INDEPENDENT_AMBULATORY_CARE_PROVIDER_SITE_OTHER): Payer: 59 | Admitting: Internal Medicine

## 2012-11-19 VITALS — BP 128/80 | HR 108 | Temp 97.9°F | Wt 111.0 lb

## 2012-11-19 DIAGNOSIS — I10 Essential (primary) hypertension: Secondary | ICD-10-CM

## 2012-11-19 DIAGNOSIS — F329 Major depressive disorder, single episode, unspecified: Secondary | ICD-10-CM

## 2012-11-19 DIAGNOSIS — E785 Hyperlipidemia, unspecified: Secondary | ICD-10-CM

## 2012-11-19 MED ORDER — ATORVASTATIN CALCIUM 40 MG PO TABS: 40.0000 mg | ORAL_TABLET | Freq: Every day | ORAL | Status: DC

## 2012-11-19 MED ORDER — MIRTAZAPINE 30 MG PO TABS: 30.0000 mg | ORAL_TABLET | Freq: Every day | ORAL | Status: DC

## 2012-11-19 MED ORDER — AMLODIPINE BESYLATE 10 MG PO TABS: 10.0000 mg | ORAL_TABLET | Freq: Every day | ORAL | Status: DC

## 2012-11-19 MED ORDER — ESOMEPRAZOLE MAGNESIUM 40 MG PO CPDR: 40.0000 mg | DELAYED_RELEASE_CAPSULE | Freq: Every day | ORAL | Status: DC

## 2012-11-19 NOTE — Progress Notes (Signed)
Pre-visit discussion using our clinic review tool. No additional management support is needed unless otherwise documented below in the visit note.  

## 2012-11-19 NOTE — Assessment & Plan Note (Signed)
BP Readings from Last 3 Encounters:  11/19/12 128/80  03/27/12 152/90  12/26/11 140/86   BP generally well controlled on current medications. Will continue. Will check renal function with labs.

## 2012-11-19 NOTE — Assessment & Plan Note (Signed)
Per caregiver, symptoms of tearfulness and depressed mood have been improved with use of Mirtazapine. Will continue.

## 2012-11-19 NOTE — Assessment & Plan Note (Signed)
Will check LFTs with labs today. COntinue Atorvastatin.

## 2012-11-19 NOTE — Progress Notes (Signed)
Subjective:    Patient ID: Kathy Mckay, female    DOB: July 31, 1932, 79 y.o.   MRN: [id number]  HPI 79YO female with developmental delay, hypertension, hyperlipidemia presents for follow up. She lives in a group home and is with her caregiver, who reports she has been well. She has no concerns today. Appetite hs been good. Bowel habits have been regular. No new concerns today.  Outpatient Encounter Prescriptions as of 11/19/2012  Medication Sig  . acetaminophen (TYLENOL) 325 MG tablet Take 650 mg by mouth every 6 (six) hours as needed.  Marland Kitchen amLODipine (NORVASC) 10 MG tablet Take 1 tablet (10 mg total) by mouth daily.  Marland Kitchen atorvastatin (LIPITOR) 40 MG tablet Take 1 tablet (40 mg total) by mouth daily.  Marland Kitchen bismuth subsalicylate (PEPTO BISMOL) 262 MG/15ML suspension Take 15 mLs by mouth every 6 (six) hours as needed.  . Calcium & Magnesium Carbonates (MYLANTA PO) Take 3 mLs by mouth.  . esomeprazole (NEXIUM) 40 MG capsule Take 1 capsule (40 mg total) by mouth daily.  Marland Kitchen guaiFENesin (ROBITUSSIN) 100 MG/5ML liquid Take 10 mLs by mouth 3 (three) times daily as needed for cough.  . loperamide (IMODIUM) 2 MG capsule Take 2 mg by mouth as needed for diarrhea or loose stools.  . Magnesium Hydroxide (MILK OF MAGNESIA PO) Take 30 mLs by mouth.  . mirtazapine (REMERON) 30 MG tablet Take 1 tablet (30 mg total) by mouth at bedtime.   BP 128/80  Pulse 108  Temp(Src) 97.9 F (36.6 C) (Oral)  Wt 111 lb (50.349 kg)  SpO2 98%    Review of Systems  Constitutional: Negative for fever, chills, appetite change, fatigue and unexpected weight change.  HENT: Negative for congestion, ear pain, sinus pressure, sore throat, trouble swallowing and voice change.   Eyes: Negative for visual disturbance.  Respiratory: Negative for cough, shortness of breath, wheezing and stridor.   Cardiovascular: Negative for chest pain, palpitations and leg swelling.  Gastrointestinal: Negative for nausea, vomiting, abdominal pain,  diarrhea, constipation, blood in stool, abdominal distention and anal bleeding.  Genitourinary: Negative for dysuria and flank pain.  Musculoskeletal: Negative for arthralgias, gait problem, myalgias and neck pain.  Skin: Negative for color change and rash.  Neurological: Negative for dizziness and headaches.  Hematological: Negative for adenopathy. Does not bruise/bleed easily.  Psychiatric/Behavioral: Negative for suicidal ideas, sleep disturbance and dysphoric mood. The patient is not nervous/anxious.        Objective:   Physical Exam  Constitutional: She is oriented to person, place, and time. She appears well-developed and well-nourished. No distress.  HENT:  Head: Normocephalic and atraumatic.  Right Ear: External ear normal.  Left Ear: External ear normal.  Nose: Nose normal.  Mouth/Throat: Oropharynx is clear and moist. No oropharyngeal exudate.  Eyes: Conjunctivae are normal. Pupils are equal, round, and reactive to light. Right eye exhibits no discharge. Left eye exhibits no discharge. No scleral icterus.  Neck: Normal range of motion. Neck supple. No tracheal deviation present. No thyromegaly present.  Cardiovascular: Normal rate, regular rhythm, normal heart sounds and intact distal pulses.  Exam reveals no gallop and no friction rub.   No murmur heard. Pulmonary/Chest: Effort normal and breath sounds normal. No accessory muscle usage. Not tachypneic. No respiratory distress. She has no decreased breath sounds. She has no wheezes. She has no rhonchi. She has no rales. She exhibits no tenderness.  Musculoskeletal: Normal range of motion. She exhibits no edema and no tenderness.  Lymphadenopathy:    She has  no cervical adenopathy.  Neurological: She is alert and oriented to person, place, and time. No cranial nerve deficit. She exhibits normal muscle tone. Coordination normal.  Skin: Skin is warm and dry. No rash noted. She is not diaphoretic. No erythema. No pallor.   Psychiatric: She has a normal mood and affect. Her behavior is normal. Judgment and thought content normal. Cognition and memory are impaired.          Assessment & Plan:

## 2012-11-20 LAB — COMPREHENSIVE METABOLIC PANEL
ALT: 32 U/L (ref 0–35)
AST: 31 U/L (ref 0–37)
BUN: 22 mg/dL (ref 6–23)
Calcium: 9.5 mg/dL (ref 8.4–10.5)
Chloride: 102 mEq/L (ref 96–112)
Creatinine, Ser: 0.9 mg/dL (ref 0.4–1.2)

## 2012-11-21 ENCOUNTER — Ambulatory Visit: Payer: 59

## 2012-11-21 DIAGNOSIS — E785 Hyperlipidemia, unspecified: Secondary | ICD-10-CM

## 2012-11-21 LAB — LIPID PANEL: Total CHOL/HDL Ratio: 3

## 2012-11-22 ENCOUNTER — Other Ambulatory Visit (INDEPENDENT_AMBULATORY_CARE_PROVIDER_SITE_OTHER): Payer: 59

## 2012-11-22 ENCOUNTER — Telehealth: Payer: Self-pay | Admitting: *Deleted

## 2012-11-22 DIAGNOSIS — R7309 Other abnormal glucose: Secondary | ICD-10-CM

## 2012-11-22 DIAGNOSIS — R739 Hyperglycemia, unspecified: Secondary | ICD-10-CM

## 2012-11-22 NOTE — Telephone Encounter (Signed)
A1c, elevated blood sugar

## 2012-11-22 NOTE — Telephone Encounter (Signed)
What labs and dx?  

## 2012-11-23 ENCOUNTER — Encounter: Payer: Self-pay | Admitting: *Deleted

## 2012-11-23 LAB — HEMOGLOBIN A1C: Hgb A1c MFr Bld: 5.8 % (ref 4.6–6.5)

## 2013-02-04 ENCOUNTER — Emergency Department: Payer: Self-pay | Admitting: Emergency Medicine

## 2013-02-04 ENCOUNTER — Telehealth: Payer: Self-pay | Admitting: Internal Medicine

## 2013-02-04 LAB — CBC
HCT: 45.2 % (ref 35.0–47.0)
HGB: 15 g/dL (ref 12.0–16.0)
MCH: 33.1 pg (ref 26.0–34.0)
MCHC: 33.2 g/dL (ref 32.0–36.0)
MCV: 100 fL (ref 80–100)
Platelet: 226 10*3/uL (ref 150–440)
RBC: 4.53 10*6/uL (ref 3.80–5.20)
RDW: 12.8 % (ref 11.5–14.5)
WBC: 10.6 10*3/uL (ref 3.6–11.0)

## 2013-02-04 LAB — COMPREHENSIVE METABOLIC PANEL
ALK PHOS: 179 U/L — AB
Albumin: 3.9 g/dL (ref 3.4–5.0)
Anion Gap: 7 (ref 7–16)
BILIRUBIN TOTAL: 0.4 mg/dL (ref 0.2–1.0)
BUN: 23 mg/dL — ABNORMAL HIGH (ref 7–18)
CALCIUM: 9.6 mg/dL (ref 8.5–10.1)
CHLORIDE: 105 mmol/L (ref 98–107)
Co2: 23 mmol/L (ref 21–32)
Creatinine: 1.04 mg/dL (ref 0.60–1.30)
EGFR (African American): 59 — ABNORMAL LOW
GFR CALC NON AF AMER: 51 — AB
Glucose: 117 mg/dL — ABNORMAL HIGH (ref 65–99)
Osmolality: 275 (ref 275–301)
Potassium: 3.7 mmol/L (ref 3.5–5.1)
SGOT(AST): 34 U/L (ref 15–37)
SGPT (ALT): 40 U/L (ref 12–78)
Sodium: 135 mmol/L — ABNORMAL LOW (ref 136–145)
TOTAL PROTEIN: 7.7 g/dL (ref 6.4–8.2)

## 2013-02-04 LAB — PRO B NATRIURETIC PEPTIDE: B-TYPE NATIURETIC PEPTID: 333 pg/mL (ref 0–450)

## 2013-02-04 LAB — CK TOTAL AND CKMB (NOT AT ARMC)
CK, Total: 227 U/L — ABNORMAL HIGH (ref 21–215)
CK-MB: 4.9 ng/mL — ABNORMAL HIGH (ref 0.5–3.6)

## 2013-02-04 LAB — TROPONIN I

## 2013-02-04 NOTE — Telephone Encounter (Signed)
Patient Information:  Caller Name: [name]  Phone: [phone]  Patient: Kathy Mckay, Kathy Mckay  Gender: Female  DOB: 02/09/1929  Age: 80 Years  PCP: Ronette Deter (Adults only)  Office Follow Up:  Does the office need to follow up with this patient?: No  Instructions For The Office: N/A  RN Note:  Transferred for triage after office staff scheduled appointment for 02/14/13.  Left leg is definitely more swollen than right. BP 157/84.  Advised to see ED or UC now for calf  swelling in both legs but one side is definitely more swollen.  Refused to drive to [facility] in [place]. Referred to [facility] in [place].  Symptoms  Reason For Call & Symptoms: Edema of lower extremities  Reviewed Health History In EMR: Yes  Reviewed Medications In EMR: Yes  Reviewed Allergies In EMR: Yes  Reviewed Surgeries / Procedures: Yes  Date of Onset of Symptoms: 02/01/2013  Treatments Tried: Elevate legs when sitting  Treatments Tried Worked: No  Guideline(s) Used:  Leg Swelling and Edema  Disposition Per Guideline:   Go to ED Now (or to Office with PCP Approval)  Reason For Disposition Reached:   Thigh, calf, or ankle swelling in both legs, but one side is definitely more swollen  Advice Given:  N/A  RN Overrode Recommendation:  Go To [facility]  No appointments remaina and its after 1600, too late for office appointment.

## 2013-02-04 NOTE — Telephone Encounter (Signed)
Addendum to triage note.  Caretaker/caller/[name] provided incorrect DOB.  Confirmed address to verify pt.  DOB is 01-05-1933.

## 2013-02-04 NOTE — Telephone Encounter (Signed)
FYI. See Below

## 2013-02-05 ENCOUNTER — Other Ambulatory Visit: Payer: Self-pay | Admitting: Internal Medicine

## 2013-02-14 ENCOUNTER — Ambulatory Visit: Payer: 59 | Admitting: Internal Medicine

## 2013-02-14 ENCOUNTER — Encounter (INDEPENDENT_AMBULATORY_CARE_PROVIDER_SITE_OTHER): Payer: Self-pay

## 2013-02-18 ENCOUNTER — Ambulatory Visit: Payer: 59 | Admitting: Internal Medicine

## 2013-02-20 ENCOUNTER — Ambulatory Visit (INDEPENDENT_AMBULATORY_CARE_PROVIDER_SITE_OTHER): Payer: 59 | Admitting: Internal Medicine

## 2013-02-20 ENCOUNTER — Telehealth: Payer: Self-pay | Admitting: *Deleted

## 2013-02-20 ENCOUNTER — Encounter: Payer: Self-pay | Admitting: Internal Medicine

## 2013-02-20 VITALS — BP 142/88 | HR 105 | Temp 97.6°F | Resp 25 | Wt 112.0 lb

## 2013-02-20 DIAGNOSIS — R0989 Other specified symptoms and signs involving the circulatory and respiratory systems: Secondary | ICD-10-CM

## 2013-02-20 DIAGNOSIS — F3289 Other specified depressive episodes: Secondary | ICD-10-CM

## 2013-02-20 DIAGNOSIS — R0600 Dyspnea, unspecified: Secondary | ICD-10-CM

## 2013-02-20 DIAGNOSIS — R0609 Other forms of dyspnea: Secondary | ICD-10-CM

## 2013-02-20 DIAGNOSIS — F32A Depression, unspecified: Secondary | ICD-10-CM

## 2013-02-20 DIAGNOSIS — F329 Major depressive disorder, single episode, unspecified: Secondary | ICD-10-CM

## 2013-02-20 LAB — COMPREHENSIVE METABOLIC PANEL
ALBUMIN: 4.2 g/dL (ref 3.5–5.2)
ALT: 57 U/L — ABNORMAL HIGH (ref 0–35)
AST: 51 U/L — AB (ref 0–37)
Alkaline Phosphatase: 129 U/L — ABNORMAL HIGH (ref 39–117)
BUN: 16 mg/dL (ref 6–23)
CHLORIDE: 101 meq/L (ref 96–112)
CO2: 23 meq/L (ref 19–32)
CREATININE: 0.9 mg/dL (ref 0.4–1.2)
Calcium: 9.3 mg/dL (ref 8.4–10.5)
GFR: 68.55 mL/min (ref >60.00)
GLUCOSE: 166 mg/dL — AB (ref 70–99)
Potassium: 4.6 mEq/L (ref 3.5–5.1)
Sodium: 137 mEq/L (ref 135–145)
Total Bilirubin: 0.9 mg/dL (ref 0.3–1.2)
Total Protein: 8 g/dL (ref 6.0–8.3)

## 2013-02-20 LAB — BRAIN NATRIURETIC PEPTIDE: Pro B Natriuretic peptide (BNP): 6 pg/mL (ref 0.0–100.0)

## 2013-02-20 LAB — D-DIMER, QUANTITATIVE: D-Dimer, Quant: 1.07 ug/mL-FEU — ABNORMAL HIGH (ref 0.00–0.48)

## 2013-02-20 MED ORDER — MIRTAZAPINE 45 MG PO TABS: 45.0000 mg | ORAL_TABLET | Freq: Every day | ORAL | Status: DC

## 2013-02-20 NOTE — Telephone Encounter (Signed)
OK. This is elevated. Please let her caregiver know. I discussed this with them today. She will need to have a CT Chest with contrast to evaluate for blood clot. I will place order for this. It should be done today if possible.

## 2013-02-20 NOTE — Telephone Encounter (Signed)
Spoke with patient caregiver [name], informed her of the results and Dr. Thomes Dinning recommendations. Per [name] she would like her to have an appointment before going over there.

## 2013-02-20 NOTE — Progress Notes (Addendum)
Subjective:    Patient ID: Kathy Mckay, female    DOB: 06/21/1932, 80 y.o.   MRN: [id number]  HPI 80YO female presents for acute visit. Left leg swelling noted in early January. Taken to urgent care. Sent from UC to ED. CXR performed. Noted "spot on lung." Given prednisone and antibiotics with no improvement. Leg swelling has improved. No noted shortness of breath or cough at home, however when pt gets "anxious" caregiver notes more rapid breathing. No fever, chills, cough. Pt denies chest pain or shortness of breath.  Caregiver notes that anxiety has been increased recently, with some crying spells at night. Pt has been taking Remeron with no improvement.  Review of Systems  Constitutional: Negative for fever, chills, appetite change, fatigue and unexpected weight change.  HENT: Negative for congestion, ear pain, sinus pressure, sore throat, trouble swallowing and voice change.   Eyes: Negative for visual disturbance.  Respiratory: Negative for cough, shortness of breath, wheezing and stridor.   Cardiovascular: Positive for leg swelling. Negative for chest pain and palpitations.  Gastrointestinal: Negative for nausea, vomiting, abdominal pain, diarrhea, constipation, blood in stool, abdominal distention and anal bleeding.  Genitourinary: Negative for dysuria and flank pain.  Musculoskeletal: Negative for arthralgias, gait problem, myalgias and neck pain.  Skin: Negative for color change and rash.  Neurological: Negative for dizziness and headaches.  Hematological: Negative for adenopathy. Does not bruise/bleed easily.  Psychiatric/Behavioral: Positive for behavioral problems, confusion, sleep disturbance, dysphoric mood and decreased concentration. Negative for suicidal ideas. The patient is nervous/anxious.        Objective:    BP 142/88  Pulse 105  Temp(Src) 97.6 F (36.4 C) (Oral)  Resp 25  Wt 112 lb (50.803 kg)  SpO2 97% Physical Exam  Constitutional: She is oriented to  person, place, and time. She appears well-developed and well-nourished. No distress.  HENT:  Head: Normocephalic and atraumatic.  Right Ear: External ear normal.  Left Ear: External ear normal.  Nose: Nose normal.  Mouth/Throat: Oropharynx is clear and moist. No oropharyngeal exudate.  Eyes: Conjunctivae are normal. Pupils are equal, round, and reactive to light. Right eye exhibits no discharge. Left eye exhibits no discharge. No scleral icterus.  Neck: Normal range of motion. Neck supple. No tracheal deviation present. No thyromegaly present.  Cardiovascular: Normal rate, regular rhythm, normal heart sounds and intact distal pulses.  Exam reveals no gallop and no friction rub.   No murmur heard. Pulmonary/Chest: Effort normal and breath sounds normal. No accessory muscle usage. Not tachypneic. No respiratory distress. She has no decreased breath sounds. She has no wheezes. She has no rhonchi. She has no rales. She exhibits no tenderness.  Musculoskeletal: Normal range of motion. She exhibits no edema and no tenderness.  Lymphadenopathy:    She has no cervical adenopathy.  Neurological: She is alert and oriented to person, place, and time. No cranial nerve deficit. She exhibits normal muscle tone. Coordination normal.  Skin: Skin is warm and dry. No rash noted. She is not diaphoretic. No erythema. No pallor.  Psychiatric: Her speech is normal and behavior is normal. Judgment and thought content normal. Her mood appears anxious. Cognition and memory are impaired.          Assessment & Plan:   Problem List Items Addressed This Visit   Depression     Recent worsening of symptoms of anxiety and depression. Will increase Remeron to 42m daily. Plan follow up in 4 weeks or sooner as needed.  Relevant Medications      mirtazapine (REMERON) tablet   Dyspnea - Primary     Dyspnea noted by tachypnea noted when pt is anxious. However, she recently had some left LE swelling (resolved) and,  question whether she may have had PE. Exam is normal today. D-dimer today is pos. Will get CT chest with contrast for complete evaluation.     Relevant Orders      D-Dimer, Quantitative (Completed)      B Nat Peptide (Completed)      Comp Met (CMET) (Completed)       Return in about 4 weeks (around 03/20/2013), or if symptoms worsen or fail to improve.

## 2013-02-20 NOTE — Telephone Encounter (Signed)
[facility] lab called with a critical D-dimer, it was 1.07

## 2013-02-20 NOTE — Progress Notes (Signed)
Pre-visit discussion using our clinic review tool. No additional management support is needed unless otherwise documented below in the visit note.  

## 2013-02-20 NOTE — Assessment & Plan Note (Addendum)
Dyspnea noted by tachypnea noted when pt is anxious. However, she recently had some left LE swelling (resolved) and, question whether she may have had PE. Exam is normal today. D-dimer today is pos. Will get CT chest with contrast for complete evaluation.

## 2013-02-20 NOTE — Assessment & Plan Note (Signed)
Recent worsening of symptoms of anxiety and depression. Will increase Remeron to 45mg  daily. Plan follow up in 4 weeks or sooner as needed.

## 2013-02-21 ENCOUNTER — Ambulatory Visit: Payer: Self-pay | Admitting: Internal Medicine

## 2013-02-21 NOTE — Telephone Encounter (Signed)
Patient caregiver [name] informed of CT results and lab results. Copy of labs faxed to [facility] as well [phone]

## 2013-02-21 NOTE — Telephone Encounter (Signed)
CT of the chest showed no acute process. Her recent issues with dyspnea were likely related to upper respiratory infection. We should continue to monitor her and follow up as planned.

## 2013-04-03 ENCOUNTER — Encounter: Payer: Self-pay | Admitting: Internal Medicine

## 2013-05-21 ENCOUNTER — Ambulatory Visit: Payer: 59 | Admitting: Internal Medicine

## 2013-05-30 ENCOUNTER — Ambulatory Visit: Payer: 59 | Admitting: Internal Medicine

## 2013-06-20 ENCOUNTER — Ambulatory Visit: Payer: 59 | Admitting: Internal Medicine

## 2013-07-08 ENCOUNTER — Ambulatory Visit: Payer: 59 | Admitting: Internal Medicine

## 2013-07-08 ENCOUNTER — Telehealth: Payer: Self-pay | Admitting: Internal Medicine

## 2013-07-08 NOTE — Telephone Encounter (Signed)
Needs to cancel appt today. Patient will not have transportation.

## 2013-07-25 ENCOUNTER — Ambulatory Visit: Payer: 59 | Admitting: Internal Medicine

## 2013-08-21 ENCOUNTER — Telehealth: Payer: Self-pay | Admitting: Internal Medicine

## 2013-08-21 ENCOUNTER — Ambulatory Visit (INDEPENDENT_AMBULATORY_CARE_PROVIDER_SITE_OTHER): Payer: 59 | Admitting: Internal Medicine

## 2013-08-21 ENCOUNTER — Encounter: Payer: Self-pay | Admitting: Internal Medicine

## 2013-08-21 VITALS — BP 138/80 | HR 112 | Temp 98.0°F | Ht <= 58 in | Wt 113.0 lb

## 2013-08-21 DIAGNOSIS — F329 Major depressive disorder, single episode, unspecified: Secondary | ICD-10-CM

## 2013-08-21 DIAGNOSIS — R7989 Other specified abnormal findings of blood chemistry: Secondary | ICD-10-CM

## 2013-08-21 DIAGNOSIS — F3289 Other specified depressive episodes: Secondary | ICD-10-CM

## 2013-08-21 DIAGNOSIS — F32A Depression, unspecified: Secondary | ICD-10-CM

## 2013-08-21 DIAGNOSIS — R945 Abnormal results of liver function studies: Secondary | ICD-10-CM

## 2013-08-21 DIAGNOSIS — I10 Essential (primary) hypertension: Secondary | ICD-10-CM

## 2013-08-21 DIAGNOSIS — E039 Hypothyroidism, unspecified: Secondary | ICD-10-CM

## 2013-08-21 DIAGNOSIS — Z23 Encounter for immunization: Secondary | ICD-10-CM

## 2013-08-21 LAB — COMPREHENSIVE METABOLIC PANEL
ALT: 45 U/L — ABNORMAL HIGH (ref 0–35)
AST: 45 U/L — ABNORMAL HIGH (ref 0–37)
Albumin: 4.3 g/dL (ref 3.5–5.2)
Alkaline Phosphatase: 141 U/L — ABNORMAL HIGH (ref 39–117)
BUN: 20 mg/dL (ref 6–23)
CALCIUM: 10 mg/dL (ref 8.4–10.5)
CHLORIDE: 105 meq/L (ref 96–112)
CO2: 20 mEq/L (ref 19–32)
Creatinine, Ser: 0.9 mg/dL (ref 0.4–1.2)
GFR: 68.47 mL/min (ref >60.00)
GLUCOSE: 176 mg/dL — AB (ref 70–99)
Potassium: 4.6 mEq/L (ref 3.5–5.1)
Sodium: 139 mEq/L (ref 135–145)
Total Bilirubin: 1.5 mg/dL — ABNORMAL HIGH (ref 0.2–1.2)
Total Protein: 6.9 g/dL (ref 6.0–8.3)

## 2013-08-21 LAB — TSH: TSH: 3.92 u[IU]/mL (ref 0.35–4.50)

## 2013-08-21 LAB — LIPID PANEL
CHOLESTEROL: 122 mg/dL (ref 0–200)
HDL: 37.8 mg/dL — ABNORMAL LOW (ref >39.00)
LDL Cholesterol: 54 mg/dL (ref 0–99)
NONHDL: 84.2
Total CHOL/HDL Ratio: 3
Triglycerides: 153 mg/dL — ABNORMAL HIGH (ref 0.0–149.0)
VLDL: 30.6 mg/dL (ref 0.0–40.0)

## 2013-08-21 MED ORDER — MIRTAZAPINE 45 MG PO TABS: 45.0000 mg | ORAL_TABLET | Freq: Every day | ORAL | Status: DC

## 2013-08-21 MED ORDER — ESOMEPRAZOLE MAGNESIUM 40 MG PO CPDR: 40.0000 mg | DELAYED_RELEASE_CAPSULE | Freq: Every day | ORAL | Status: DC

## 2013-08-21 MED ORDER — ATORVASTATIN CALCIUM 40 MG PO TABS: 40.0000 mg | ORAL_TABLET | Freq: Every day | ORAL | Status: DC

## 2013-08-21 MED ORDER — AMLODIPINE BESYLATE 10 MG PO TABS: 10.0000 mg | ORAL_TABLET | Freq: Every day | ORAL | Status: DC

## 2013-08-21 NOTE — Progress Notes (Signed)
Subjective:    Patient ID: Kathy Mckay, female    DOB: March 28, 1932, 80 y.o.   MRN: [id number]  HPI 80YO female presents for follow up. Feeling well. Denies any complaints today. Continues to have mild bilateral lower leg swelling. Improved by keeping legs elevated. Appetite good. Regular BMs. Denies any dyspnea.  Review of Systems  Constitutional: Negative for fever, chills, appetite change, fatigue and unexpected weight change.  Eyes: Negative for visual disturbance.  Respiratory: Negative for cough and shortness of breath.   Cardiovascular: Positive for leg swelling. Negative for chest pain.  Gastrointestinal: Negative for nausea, vomiting, abdominal pain, diarrhea, constipation and blood in stool.  Musculoskeletal: Negative for arthralgias, back pain and myalgias.  Skin: Negative for color change and rash.  Hematological: Negative for adenopathy. Does not bruise/bleed easily.  Psychiatric/Behavioral: Negative for sleep disturbance and dysphoric mood. The patient is not nervous/anxious.        Objective:    BP 138/80  Pulse 112  Temp(Src) 98 F (36.7 C) (Oral)  Ht 4\' 10"  (1.473 m)  Wt 113 lb (51.256 kg)  BMI 23.62 kg/m2  SpO2 96% Physical Exam  Constitutional: She is oriented to person, place, and time. She appears well-developed and well-nourished. No distress.  HENT:  Head: Normocephalic and atraumatic.  Right Ear: External ear normal.  Left Ear: External ear normal.  Nose: Nose normal.  Mouth/Throat: Oropharynx is clear and moist. No oropharyngeal exudate.  Eyes: Conjunctivae are normal. Pupils are equal, round, and reactive to light. Right eye exhibits no discharge. Left eye exhibits no discharge. No scleral icterus.  Neck: Normal range of motion. Neck supple. No tracheal deviation present. No thyromegaly present.  Cardiovascular: Normal rate, regular rhythm, normal heart sounds and intact distal pulses.  Exam reveals no gallop and no friction rub.   No murmur  heard. Pulmonary/Chest: Effort normal and breath sounds normal. No accessory muscle usage. Not tachypneic. No respiratory distress. She has no decreased breath sounds. She has no wheezes. She has no rhonchi. She has no rales. She exhibits no tenderness.  Musculoskeletal: Normal range of motion. She exhibits edema (bilateral LE trace edema). She exhibits no tenderness.  Lymphadenopathy:    She has no cervical adenopathy.  Neurological: She is alert and oriented to person, place, and time. No cranial nerve deficit. She exhibits normal muscle tone. Coordination normal.  Skin: Skin is warm and dry. No rash noted. She is not diaphoretic. No erythema. No pallor.  Psychiatric: Her speech is normal and behavior is normal. Judgment and thought content normal. Her mood appears anxious. Cognition and memory are normal.          Assessment & Plan:   Problem List Items Addressed This Visit     Unprioritized   Depression     Symptoms well controlled with Remeron. Will continue.    Relevant Medications      mirtazapine (REMERON) tablet   Hypertension - Primary      BP Readings from Last 3 Encounters:  08/21/13 138/80  02/20/13 142/88  11/19/12 128/80   BP slightly elevated initially, however improved after sitting for a few minutes. Renal function with labs today. Continue current medication.    Relevant Medications      atorvastatin (LIPITOR) tablet      amLODIpine (NORVASC) tablet   Other Relevant Orders      Comprehensive metabolic panel      Lipid panel   Hypothyroidism     Will check TSH with labs today.  Return in about 6 months (around 02/21/2014) for Wellness Visit.

## 2013-08-21 NOTE — Addendum Note (Signed)
Addended by: Vernetta Honey on: 08/21/2013 10:55 AM   Modules accepted: Orders

## 2013-08-21 NOTE — Progress Notes (Signed)
Pre visit review using our clinic review tool, if applicable. No additional management support is needed unless otherwise documented below in the visit note. 

## 2013-08-21 NOTE — Telephone Encounter (Signed)
Relevant patient education mailed to patient.  

## 2013-08-21 NOTE — Assessment & Plan Note (Signed)
BP Readings from Last 3 Encounters:  08/21/13 138/80  02/20/13 142/88  11/19/12 128/80   BP slightly elevated initially, however improved after sitting for a few minutes. Renal function with labs today. Continue current medication.

## 2013-08-21 NOTE — Assessment & Plan Note (Signed)
Will check TSH with labs today. 

## 2013-08-21 NOTE — Assessment & Plan Note (Signed)
Symptoms well controlled with Remeron. Will continue.

## 2013-08-21 NOTE — Patient Instructions (Signed)
Labs today.  Prevnar today.  Follow up in 6 months and as needed.

## 2013-08-21 NOTE — Addendum Note (Signed)
Addended by: Karlene Einstein D on: 08/21/2013 11:11 AM   Modules accepted: Orders

## 2013-08-28 ENCOUNTER — Encounter: Payer: Self-pay | Admitting: *Deleted

## 2013-08-28 NOTE — Addendum Note (Signed)
Addended by: Ronette Deter A on: 08/28/2013 02:06 PM   Modules accepted: Orders

## 2013-09-12 ENCOUNTER — Ambulatory Visit: Payer: Self-pay | Admitting: Internal Medicine

## 2013-09-12 ENCOUNTER — Telehealth: Payer: Self-pay | Admitting: Internal Medicine

## 2013-09-12 DIAGNOSIS — K838 Other specified diseases of biliary tract: Secondary | ICD-10-CM

## 2013-09-12 NOTE — Telephone Encounter (Signed)
Pt's caregiver agreeable to referral to Dr. Candace Cruise and further testing. Advised our [facility] would contact once an appointment has been scheduled.

## 2013-09-12 NOTE — Telephone Encounter (Signed)
Abdominal Ultrasound showed dilation of the bile ducts. The radiologist has recommended a test called an MRCP, which is a specialized MRI to look at the bile ducts. This test would likely require general anesthesia for her. I would recommend that we set up a referral to GI and ask them about performing a test called an ERCP, which allows direct visualization of the bile ducts. Dr. Candace Cruise at [facility] performs this procedure, or alternatively, we can refer her to [facility].

## 2013-09-12 NOTE — Telephone Encounter (Signed)
Pt's caregiver notified. Wants to know if this is absolutely necessary due to her age. Wants to know if any other tests could be done besides an ERCP?

## 2013-09-12 NOTE — Telephone Encounter (Signed)
The only other option is the MRI, however I don't think she would be able to tolerate that test and would probably need anesthesia for it. The testing is looking for cancer of the bile ducts. We don't absolutely have to do it, but in order to know what is causing her belly pain, we will need to do more testing.

## 2013-09-24 ENCOUNTER — Encounter: Payer: Self-pay | Admitting: Internal Medicine

## 2013-10-10 ENCOUNTER — Telehealth: Payer: Self-pay | Admitting: Internal Medicine

## 2013-10-10 NOTE — Telephone Encounter (Signed)
The radiologist reviewed the previous US report and states that the findings seen with dilation of the biliary duct appear to be chronic. If Kathy Mckay has persistent nausea or vomiting, then we should pursue additional testing under sedation.

## 2013-10-10 NOTE — Telephone Encounter (Signed)
Notified pt's guardian, [name].

## 2013-12-20 ENCOUNTER — Encounter: Payer: 59 | Admitting: Internal Medicine

## 2014-01-27 ENCOUNTER — Encounter: Payer: 59 | Admitting: Internal Medicine

## 2014-03-05 ENCOUNTER — Ambulatory Visit (INDEPENDENT_AMBULATORY_CARE_PROVIDER_SITE_OTHER): Payer: Medicare Other | Admitting: Internal Medicine

## 2014-03-05 ENCOUNTER — Encounter: Payer: Self-pay | Admitting: Internal Medicine

## 2014-03-05 VITALS — BP 150/88 | HR 110 | Temp 97.9°F | Ht <= 58 in | Wt 113.4 lb

## 2014-03-05 DIAGNOSIS — M1990 Unspecified osteoarthritis, unspecified site: Secondary | ICD-10-CM

## 2014-03-05 DIAGNOSIS — F329 Major depressive disorder, single episode, unspecified: Secondary | ICD-10-CM | POA: Diagnosis not present

## 2014-03-05 DIAGNOSIS — Z0000 Encounter for general adult medical examination without abnormal findings: Secondary | ICD-10-CM

## 2014-03-05 DIAGNOSIS — I10 Essential (primary) hypertension: Secondary | ICD-10-CM | POA: Diagnosis not present

## 2014-03-05 DIAGNOSIS — F32A Depression, unspecified: Secondary | ICD-10-CM

## 2014-03-05 DIAGNOSIS — Z23 Encounter for immunization: Secondary | ICD-10-CM

## 2014-03-05 DIAGNOSIS — E784 Other hyperlipidemia: Secondary | ICD-10-CM | POA: Diagnosis not present

## 2014-03-05 LAB — CBC WITH DIFFERENTIAL/PLATELET
Basophils Absolute: 0 10*3/uL (ref 0.0–0.1)
Basophils Relative: 0.3 % (ref 0.0–3.0)
EOS ABS: 0.1 10*3/uL (ref 0.0–0.7)
Eosinophils Relative: 0.7 % (ref 0.0–5.0)
HEMATOCRIT: 47.6 % — AB (ref 36.0–46.0)
Hemoglobin: 16.3 g/dL — ABNORMAL HIGH (ref 12.0–15.0)
LYMPHS ABS: 1.1 10*3/uL (ref 0.7–4.0)
Lymphocytes Relative: 14.2 % (ref 12.0–46.0)
MCHC: 34.2 g/dL (ref 30.0–36.0)
MCV: 96.8 fl (ref 78.0–100.0)
MONO ABS: 0.5 10*3/uL (ref 0.1–1.0)
Monocytes Relative: 6.4 % (ref 3.0–12.0)
NEUTROS PCT: 78.4 % — AB (ref 43.0–77.0)
Neutro Abs: 6 10*3/uL (ref 1.4–7.7)
PLATELETS: 237 10*3/uL (ref 150.0–400.0)
RBC: 4.91 Mil/uL (ref 3.87–5.11)
RDW: 13.6 % (ref 11.5–15.5)
WBC: 7.7 10*3/uL (ref 4.0–10.5)

## 2014-03-05 LAB — LIPID PANEL
CHOL/HDL RATIO: 3
Cholesterol: 133 mg/dL (ref 0–200)
HDL: 45.1 mg/dL (ref >39.00)
LDL Cholesterol: 57 mg/dL (ref 0–99)
NONHDL: 87.9
Triglycerides: 154 mg/dL — ABNORMAL HIGH (ref 0.0–149.0)
VLDL: 30.8 mg/dL (ref 0.0–40.0)

## 2014-03-05 LAB — COMPREHENSIVE METABOLIC PANEL
ALK PHOS: 148 U/L — AB (ref 39–117)
ALT: 40 U/L — AB (ref 0–35)
AST: 34 U/L (ref 0–37)
Albumin: 4.5 g/dL (ref 3.5–5.2)
BUN: 22 mg/dL (ref 6–23)
CO2: 23 meq/L (ref 19–32)
Calcium: 10 mg/dL (ref 8.4–10.5)
Chloride: 104 mEq/L (ref 96–112)
Creatinine, Ser: 0.85 mg/dL (ref 0.40–1.20)
GFR: 68.37 mL/min (ref >60.00)
Glucose, Bld: 160 mg/dL — ABNORMAL HIGH (ref 70–99)
POTASSIUM: 3.8 meq/L (ref 3.5–5.1)
SODIUM: 139 meq/L (ref 135–145)
Total Bilirubin: 0.7 mg/dL (ref 0.2–1.2)
Total Protein: 7.7 g/dL (ref 6.0–8.3)

## 2014-03-05 LAB — HM COLONOSCOPY

## 2014-03-05 LAB — HM MAMMOGRAPHY

## 2014-03-05 LAB — HM PAP SMEAR

## 2014-03-05 MED ORDER — MELOXICAM 15 MG PO TABS: 15.0000 mg | ORAL_TABLET | Freq: Every day | ORAL | Status: DC | PRN

## 2014-03-05 MED ORDER — GUAIFENESIN 100 MG/5ML PO LIQD: 10.0000 mL | Freq: Three times a day (TID) | ORAL | Status: DC | PRN

## 2014-03-05 MED ORDER — LOPERAMIDE HCL 2 MG PO CAPS: 2.0000 mg | ORAL_CAPSULE | ORAL | Status: DC | PRN

## 2014-03-05 MED ORDER — ACETAMINOPHEN 325 MG PO TABS: 650.0000 mg | ORAL_TABLET | Freq: Four times a day (QID) | ORAL | Status: DC | PRN

## 2014-03-05 MED ORDER — CALCIUM & MAGNESIUM CARBONATES 311-232 MG PO TABS: 1.0000 | ORAL_TABLET | Freq: Two times a day (BID) | ORAL | Status: DC | PRN

## 2014-03-05 MED ORDER — ATORVASTATIN CALCIUM 40 MG PO TABS: 40.0000 mg | ORAL_TABLET | Freq: Every day | ORAL | Status: DC

## 2014-03-05 MED ORDER — AMLODIPINE BESYLATE 10 MG PO TABS: 10.0000 mg | ORAL_TABLET | Freq: Every day | ORAL | Status: DC

## 2014-03-05 MED ORDER — MIRTAZAPINE 45 MG PO TABS: 45.0000 mg | ORAL_TABLET | Freq: Every day | ORAL | Status: DC

## 2014-03-05 MED ORDER — ESOMEPRAZOLE MAGNESIUM 40 MG PO CPDR
40.0000 mg | DELAYED_RELEASE_CAPSULE | Freq: Every day | ORAL | Status: DC
Start: 2014-03-05 — End: 2015-02-26

## 2014-03-05 MED ORDER — MAGNESIUM HYDROXIDE 400 MG/5ML PO SUSP: 5.0000 mL | Freq: Every day | ORAL | Status: DC | PRN

## 2014-03-05 MED ORDER — BISMUTH SUBSALICYLATE 262 MG/15ML PO SUSP: 15.0000 mL | Freq: Four times a day (QID) | ORAL | Status: DC | PRN

## 2014-03-05 NOTE — Assessment & Plan Note (Signed)
BP Readings from Last 3 Encounters:  03/05/14 150/88  08/21/13 138/80  02/20/13 142/88   BP slightly elevated today, however much better controlled at home. Will continue Amlodipine. Renal function with labs.

## 2014-03-05 NOTE — Progress Notes (Signed)
Pre visit review using our clinic review tool, if applicable. No additional management support is needed unless otherwise documented below in the visit note. 

## 2014-03-05 NOTE — Assessment & Plan Note (Addendum)
General medical exam normal today except as noted. Pt declines mammogram, colonoscopy, PAP. Fall prevention measures are in place. She continues to receive assistance with ADLs as home care living facility. She appears to be doing well. Will check labs today including CBC, CMP, lipids. Flu vaccine today. Follow up in 6 months and prn.

## 2014-03-05 NOTE — Patient Instructions (Signed)

## 2014-03-05 NOTE — Progress Notes (Signed)
The patient is here for annual Medicare Wellness Examination and management of other chronic and acute problems.   The risk factors are reflected in the history.  The roster of all physicians providing medical care to patient - is listed in the Snapshot section of the chart.  Activities of daily living:   The patient requires assistance in all ADLs: dressing, toileting, feeding as well as mobility, uses a wheelchair. She does make attempt to dress self and assist with bathing. Patient lives in an assisted living facility.  Home safety :  The patient has smoke detectors in the home.  They wear seatbelts in their car. There are no firearms at home.  There is no violence in the home. They feel safe where they live.  Infectious Risks: There is no risks for hepatitis, STDs or HIV.  There is no  history of blood transfusion.  They have no travel history to infectious disease endemic areas of the world.  Additional Health Care Providers: The patient has seen their dentist in the last six months. Had dentures made but they do not fit well so she does not wear them. They have not seen their eye doctor in the last year.  They have chronic hearing loss. They have deferred audiologic testing in the last year.  Declined hearing aids. They do not  have excessive sun exposure. Discussed the need for sun protection: hats,long sleeves and use of sunscreen if there is significant sun exposure.    Diet: the importance of a healthy diet is discussed. They do have a healthy diet.  The benefits of regular aerobic exercise were discussed. Patient does not exercise.  Depression screen: there are no signs or vegative symptoms of depression- irritability, change in appetite, anhedonia, sadness/tearfullness.  Cognitive assessment: Chronic cognitive delay, unable to perform cognitive assessment.  HCPOA - She is a ward of [employer].  The following portions of the patient's history were  reviewed and updated as appropriate: allergies, current medications, past family history, past medical history,  past surgical history, past social history and problem list.  Visual acuity was not assessed per patient preference as they have regular follow up with their ophthalmologist. Hearing and body mass index were assessed and reviewed.   During the course of the visit the patient was educated and counseled about appropriate screening and preventive services including : fall prevention , diabetes screening, nutrition counseling, colorectal cancer screening, and recommended immunizations.    Review of Systems  Constitutional: Negative for fever and fatigue.  HENT: Positive for hearing loss.   Respiratory: Negative for cough and shortness of breath.   Cardiovascular: Negative for chest pain.  Gastrointestinal: Negative for nausea, vomiting, abdominal pain, diarrhea and constipation.  Musculoskeletal: Negative for myalgias, back pain, arthralgias and neck pain.  Psychiatric/Behavioral: Negative for sleep disturbance and dysphoric mood.  Note ROS is limited by patient cognitive delay    Objective:    BP 150/88 mmHg  Pulse 110  Temp(Src) 97.9 F (36.6 C) (Oral)  Ht 4\' 10"  (1.473 m)  Wt 113 lb 6 oz (51.427 kg)  BMI 23.70 kg/m2  SpO2 90% Physical Exam  Constitutional: She is oriented to person, place, and time. She appears well-developed and well-nourished. No distress.  HENT:  Head: Normocephalic and atraumatic.  Right Ear: External ear normal. Decreased hearing is noted.  Left Ear: External ear normal. Decreased hearing is noted.  Nose: Nose normal.  Mouth/Throat: Oropharynx is clear and moist. No oropharyngeal exudate.  Eyes: Conjunctivae and  EOM are normal. Pupils are equal, round, and reactive to light. Right eye exhibits no discharge.  Neck: Normal range of motion. Neck supple. No thyromegaly present.  Cardiovascular: Normal rate, regular rhythm, normal heart sounds and intact  distal pulses.  Exam reveals no gallop and no friction rub.   No murmur heard. Pulmonary/Chest: Effort normal. No respiratory distress. She has no wheezes. She has no rales.  Abdominal: Soft. Bowel sounds are normal. She exhibits no distension and no mass. There is no tenderness. There is no rebound and no guarding.  Musculoskeletal: Normal range of motion. She exhibits no edema or tenderness.  Lymphadenopathy:    She has no cervical adenopathy.  Neurological: She is alert and oriented to person, place, and time. No cranial nerve deficit. Coordination normal.  Skin: Skin is warm and dry. No rash noted. She is not diaphoretic. No erythema. No pallor.  Psychiatric: Her speech is normal and behavior is normal. Judgment and thought content normal. Her mood appears anxious. Cognition and memory are impaired.          Assessment & Plan:   Problem List Items Addressed This Visit      Unprioritized   Depression   Relevant Medications   mirtazapine (REMERON) tablet   Hypertension    BP Readings from Last 3 Encounters:  03/05/14 150/88  08/21/13 138/80  02/20/13 142/88   BP slightly elevated today, however much better controlled at home. Will continue Amlodipine. Renal function with labs.      Relevant Medications   amLODIpine (NORVASC) tablet   atorvastatin (LIPITOR) tablet   Medicare annual wellness visit, subsequent - Primary    General medical exam normal today except as noted. Pt declines mammogram, colonoscopy, PAP. Fall prevention measures are in place. She continues to receive assistance with ADLs as home care living facility. She appears to be doing well. Will check labs today including CBC, CMP, lipids. Flu vaccine today. Follow up in 6 months and prn.      Relevant Orders   CBC with Differential/Platelet   Comprehensive metabolic panel   Lipid panel   Osteoarthritis   Relevant Medications   meloxicam (MOBIC) tablet   acetaminophen (TYLENOL) tablet    Other Visit  Diagnoses    Encounter for immunization            Return in about 6 months (around 09/03/2014) for Recheck.

## 2014-03-06 ENCOUNTER — Other Ambulatory Visit (INDEPENDENT_AMBULATORY_CARE_PROVIDER_SITE_OTHER): Payer: Medicare Other

## 2014-03-06 DIAGNOSIS — R7309 Other abnormal glucose: Secondary | ICD-10-CM

## 2014-03-06 LAB — HEMOGLOBIN A1C: Hgb A1c MFr Bld: 5.9 % (ref 4.6–6.5)

## 2014-03-17 DIAGNOSIS — B351 Tinea unguium: Secondary | ICD-10-CM | POA: Diagnosis not present

## 2014-03-17 DIAGNOSIS — M79674 Pain in right toe(s): Secondary | ICD-10-CM | POA: Diagnosis not present

## 2014-03-17 DIAGNOSIS — M79675 Pain in left toe(s): Secondary | ICD-10-CM | POA: Diagnosis not present

## 2014-09-30 ENCOUNTER — Ambulatory Visit (INDEPENDENT_AMBULATORY_CARE_PROVIDER_SITE_OTHER): Payer: Medicare Other | Admitting: Internal Medicine

## 2014-09-30 ENCOUNTER — Encounter: Payer: Self-pay | Admitting: Internal Medicine

## 2014-09-30 VITALS — BP 155/88 | HR 115 | Temp 98.0°F | Ht <= 58 in | Wt 109.0 lb

## 2014-09-30 DIAGNOSIS — F32A Depression, unspecified: Secondary | ICD-10-CM

## 2014-09-30 DIAGNOSIS — I10 Essential (primary) hypertension: Secondary | ICD-10-CM | POA: Diagnosis not present

## 2014-09-30 DIAGNOSIS — M19011 Primary osteoarthritis, right shoulder: Secondary | ICD-10-CM | POA: Diagnosis not present

## 2014-09-30 DIAGNOSIS — Z23 Encounter for immunization: Secondary | ICD-10-CM | POA: Diagnosis not present

## 2014-09-30 DIAGNOSIS — F329 Major depressive disorder, single episode, unspecified: Secondary | ICD-10-CM

## 2014-09-30 DIAGNOSIS — R6250 Unspecified lack of expected normal physiological development in childhood: Secondary | ICD-10-CM

## 2014-09-30 LAB — COMPREHENSIVE METABOLIC PANEL
ALK PHOS: 121 U/L — AB (ref 39–117)
ALT: 44 U/L — ABNORMAL HIGH (ref 0–35)
AST: 33 U/L (ref 0–37)
Albumin: 4.4 g/dL (ref 3.5–5.2)
BUN: 26 mg/dL — AB (ref 6–23)
CO2: 22 meq/L (ref 19–32)
Calcium: 10.1 mg/dL (ref 8.4–10.5)
Chloride: 102 mEq/L (ref 96–112)
Creatinine, Ser: 0.94 mg/dL (ref 0.40–1.20)
GFR: 60.79 mL/min (ref >60.00)
GLUCOSE: 221 mg/dL — AB (ref 70–99)
POTASSIUM: 3.9 meq/L (ref 3.5–5.1)
SODIUM: 138 meq/L (ref 135–145)
TOTAL PROTEIN: 7.8 g/dL (ref 6.0–8.3)
Total Bilirubin: 1 mg/dL (ref 0.2–1.2)

## 2014-09-30 NOTE — Assessment & Plan Note (Signed)
Continues to live at ALF. Will continue care.

## 2014-09-30 NOTE — Assessment & Plan Note (Signed)
BP Readings from Last 3 Encounters:  09/30/14 155/88  03/05/14 150/88  08/21/13 138/80   BP slightly higher here versus at home.  Renal function with labs. Continue current medications.

## 2014-09-30 NOTE — Progress Notes (Signed)
Subjective:    Patient ID: Kathy Mckay, female    DOB: 02/05/1932, 81 y.o.   MRN: [id number]  HPI  81YO female presents for follow up.  No concerns today. Sleeping well. Good appetite. Caregiver is concerned about large pore on left upper cheek. Not painful. Larger in size recently.  She notes some intermittent right shoulder pain. Worse with change in weather. Aching. Improved with tylenol.  Wt Readings from Last 3 Encounters:  09/30/14 109 lb (49.442 kg)  03/05/14 113 lb 6 oz (51.427 kg)  08/21/13 113 lb (51.256 kg)   BP Readings from Last 3 Encounters:  09/30/14 155/88  03/05/14 150/88  08/21/13 138/80     Past Medical History  Diagnosis Date  . Hypertension   . GERD (gastroesophageal reflux disease)   . Hyperlipidemia   . Osteoporosis   . Developmental delay    Family History  Problem Relation Age of Onset  . Diabetes Mother   . Diabetes Father    Past Surgical History  Procedure Laterality Date  . Abdominal hysterectomy    . Shoulder surgery     Social History   Social History  . Marital Status: Single    Spouse Name: N/A  . Number of Children: N/A  . Years of Education: N/A   Social History Main Topics  . Smoking status: Never Smoker   . Smokeless tobacco: Never Used  . Alcohol Use: No  . Drug Use: No  . Sexual Activity: Not Asked   Other Topics Concern  . None   Social History Narrative   Lives at a family care home. No living relatives. Guardian of the state.    Review of Systems  Constitutional: Negative for fever, chills, appetite change, fatigue and unexpected weight change.  Eyes: Negative for visual disturbance.  Respiratory: Negative for cough and shortness of breath.   Cardiovascular: Negative for chest pain and leg swelling.  Gastrointestinal: Negative for nausea, vomiting, abdominal pain, diarrhea and constipation.  Musculoskeletal: Positive for arthralgias (occasional right shoulder). Negative for myalgias.  Skin: Negative  for color change and rash.  Hematological: Negative for adenopathy. Does not bruise/bleed easily.  Psychiatric/Behavioral: Negative for sleep disturbance and dysphoric mood. The patient is not nervous/anxious.        Objective:    BP 155/88 mmHg  Pulse 115  Temp(Src) 98 F (36.7 C) (Oral)  Ht 4\' 10"  (1.473 m)  Wt 109 lb (49.442 kg)  BMI 22.79 kg/m2  SpO2 95% Physical Exam  Constitutional: She is oriented to person, place, and time. She appears well-developed and well-nourished. No distress.  HENT:  Head: Normocephalic and atraumatic.  Right Ear: External ear normal.  Left Ear: External ear normal.  Nose: Nose normal.  Mouth/Throat: Oropharynx is clear and moist. No oropharyngeal exudate.  Eyes: Conjunctivae are normal. Pupils are equal, round, and reactive to light. Right eye exhibits no discharge. Left eye exhibits no discharge. No scleral icterus.  Neck: Normal range of motion. Neck supple. No tracheal deviation present. No thyromegaly present.  Cardiovascular: Normal rate, regular rhythm, normal heart sounds and intact distal pulses.  Exam reveals no gallop and no friction rub.   No murmur heard. Pulmonary/Chest: Effort normal and breath sounds normal. No respiratory distress. She has no wheezes. She has no rales. She exhibits no tenderness.  Musculoskeletal: Normal range of motion. She exhibits no edema or tenderness.  Lymphadenopathy:    She has no cervical adenopathy.  Neurological: She is alert and oriented to person, place,  and time. She displays normal reflexes. No cranial nerve deficit. She exhibits normal muscle tone. Coordination normal.  Skin: Skin is warm and dry. No rash noted. She is not diaphoretic. No erythema. No pallor.     Psychiatric: Her speech is normal and behavior is normal. Judgment and thought content normal. Her mood appears anxious. Cognition and memory are impaired.          Assessment & Plan:   Problem List Items Addressed This Visit       Unprioritized   Depression    Symptoms well controlled with Remeron. Will continue.      Developmental delay    Continues to live at ALF. Will continue care.      Hypertension - Primary    BP Readings from Last 3 Encounters:  09/30/14 155/88  03/05/14 150/88  08/21/13 138/80   BP slightly higher here versus at home.  Renal function with labs. Continue current medications.      Relevant Orders   Comprehensive metabolic panel   Osteoarthritis    Symptoms well controlled with prn Tylenol and Meloxicam.          Return in about 6 months (around 03/30/2015) for Wellness Visit.

## 2014-09-30 NOTE — Progress Notes (Signed)
Pre visit review using our clinic review tool, if applicable. No additional management support is needed unless otherwise documented below in the visit note. 

## 2014-09-30 NOTE — Assessment & Plan Note (Signed)
Symptoms well controlled with prn Tylenol and Meloxicam.

## 2014-09-30 NOTE — Assessment & Plan Note (Signed)
Symptoms well controlled with Remeron. Will continue.

## 2014-09-30 NOTE — Patient Instructions (Signed)
Labs today.  Follow up in 6 months and sooner as needed. 

## 2015-02-04 DIAGNOSIS — M79674 Pain in right toe(s): Secondary | ICD-10-CM | POA: Diagnosis not present

## 2015-02-04 DIAGNOSIS — B351 Tinea unguium: Secondary | ICD-10-CM | POA: Diagnosis not present

## 2015-02-04 DIAGNOSIS — M79675 Pain in left toe(s): Secondary | ICD-10-CM | POA: Diagnosis not present

## 2015-02-26 ENCOUNTER — Other Ambulatory Visit: Payer: Self-pay | Admitting: Internal Medicine

## 2015-03-30 ENCOUNTER — Ambulatory Visit: Payer: Self-pay | Admitting: Internal Medicine

## 2015-04-02 ENCOUNTER — Ambulatory Visit: Payer: Self-pay

## 2015-04-10 ENCOUNTER — Other Ambulatory Visit: Payer: Self-pay | Admitting: Internal Medicine

## 2015-04-16 ENCOUNTER — Ambulatory Visit (INDEPENDENT_AMBULATORY_CARE_PROVIDER_SITE_OTHER): Payer: Medicare Other

## 2015-04-16 VITALS — BP 148/80 | HR 106 | Temp 97.3°F | Resp 14 | Ht <= 58 in | Wt 107.8 lb

## 2015-04-16 DIAGNOSIS — Z0000 Encounter for general adult medical examination without abnormal findings: Secondary | ICD-10-CM

## 2015-04-16 NOTE — Progress Notes (Signed)
Annual Wellness Visit as completed by Health Coach was reviewed in full.  

## 2015-04-16 NOTE — Patient Instructions (Signed)
  Kathy Mckay , Thank you for taking time to come for your Medicare Wellness Visit. I appreciate your ongoing commitment to your health goals. Please review the following plan we discussed and let me know if I can assist you in the future.   Follow up with Dr. Gilford Rile as needed.   This is a list of the screening recommended for you and due dates:  Health Maintenance  Topic Date Due  . Tetanus Vaccine  01/15/1953  . Shingles Vaccine  01/15/1994  . Flu Shot  08/11/2015  . DEXA scan (bone density measurement)  Completed  . Pneumonia vaccines  Completed

## 2015-04-16 NOTE — Progress Notes (Signed)
Subjective:   Kathy Mckay is a 82 y.o. female who presents for Medicare Annual (Subsequent) preventive examination.  Review of Systems:  No ROS.  Medicare Wellness Visit.  Cardiac Risk Factors include: advanced age (>70men, >65 women);hypertension     Objective:     Vitals: BP 148/80 mmHg  Pulse 106  Temp(Src) 97.3 F (36.3 C) (Oral)  Resp 14  Ht 4\' 9"  (1.448 m)  Wt 107 lb 12.8 oz (48.898 kg)  BMI 23.32 kg/m2  SpO2 97%  Body mass index is 23.32 kg/(m^2).   Tobacco History  Smoking status  . Never Smoker   Smokeless tobacco  . Never Used     Counseling given: Not Answered   Past Medical History  Diagnosis Date  . Hypertension   . GERD (gastroesophageal reflux disease)   . Hyperlipidemia   . Osteoporosis   . Developmental delay    Past Surgical History  Procedure Laterality Date  . Abdominal hysterectomy    . Shoulder surgery     Family History  Problem Relation Age of Onset  . Diabetes Mother   . Diabetes Father    History  Sexual Activity  . Sexual Activity: No    Outpatient Encounter Prescriptions as of 04/16/2015  Medication Sig  . acetaminophen (TYLENOL) 325 MG tablet Take 2 tablets (650 mg total) by mouth every 6 (six) hours as needed.  Marland Kitchen amLODipine (NORVASC) 10 MG tablet TAKE 1 TABLET BY MOUTH DAILY  . atorvastatin (LIPITOR) 40 MG tablet TAKE 1 TABLET BY MOUTH ONCE DAILY  . calcium & magnesium carbonates (MYLANTA) 311-232 MG per tablet Take 1 tablet by mouth 2 (two) times daily as needed.  Marland Kitchen esomeprazole (NEXIUM) 40 MG capsule TAKE 1 CAPSULE BY MOUTH DAILY AT NOON.  . GNP STOMACH RELIEF 262 MG/15ML suspension GIVE 1 TBSP (15 ML) BY MOUTH EVERY 6 HOURS AS NEEDED FOR INDIGESTION  . guaiFENesin (ROBITUSSIN) 100 MG/5ML liquid Take 10 mLs by mouth 3 (three) times daily as needed for cough.  . loperamide (IMODIUM) 2 MG capsule Take 1 capsule (2 mg total) by mouth as needed for diarrhea or loose stools.  . magnesium hydroxide (MILK OF MAGNESIA) 400  MG/5ML suspension Take 5 mLs by mouth daily as needed.  . meloxicam (MOBIC) 15 MG tablet Take 1 tablet (15 mg total) by mouth daily as needed for pain.  . mirtazapine (REMERON) 45 MG tablet TAKE ONE TABLET BY MOUTH AT BEDTIME.   No facility-administered encounter medications on file as of 04/16/2015.    Activities of Daily Living In your present state of health, do you have any difficulty performing the following activities: 04/16/2015  Hearing? Y  Vision? Y  Difficulty concentrating or making decisions? Y  Walking or climbing stairs? Y  Dressing or bathing? Y  Doing errands, shopping? Y  Preparing Food and eating ? Y  Using the Toilet? Y  In the past six months, have you accidently leaked urine? Y  Do you have problems with loss of bowel control? Y  Managing your Medications? Y  Managing your Finances? Y  Housekeeping or managing your Housekeeping? Y    Patient Care Team: Jackolyn Confer, MD as PCP - General (Internal Medicine)    Assessment:   This is a routine wellness examination for Kathy Mckay. The goal of the wellness visit is to assist the patient how to close the gaps in care and create a preventative care plan for the patient.  Caregiver [name] is present to assist,  HIPPA cleared.  Osteoporosis risk reviewed.  Medications reviewed; taking without issues or barriers.  Safety issues reviewed; smoke detectors in the home. No firearms in the home. Wears seatbelts when riding with others. No violence in the home.     The patient was oriented x 1 to person; appropriate in dress and manner.  Stable and followed by PCP.  Patient Concerns:  Change Nexium administration time to 7:00am; OK per PCP and pharmacy notified.  6 month follow up scheduled without labs.  Exercise Activities and Dietary recommendations Current Exercise Habits: Home exercise routine, Type of exercise: stretching (Chair exercises), Frequency (Times/Week): 7, Intensity: Mild  Goals    . Healthy  Lifestyle     Stay active and continue leading the exercises. Drink plenty of fluids and stay hydrated. Healthy diet.      Fall Risk Fall Risk  04/16/2015 03/05/2014  Falls in the past year? No No   Depression Screen PHQ 2/9 Scores 04/16/2015 03/05/2014  PHQ - 2 Score 0 0     Cognitive Testing MMSE - Mini Mental State Exam 04/16/2015  Not completed: Unable to complete    Immunization History  Administered Date(s) Administered  . Influenza Split 10/20/2010, 11/09/2011  . Influenza,inj,Quad PF,36+ Mos 03/05/2014, 09/30/2014  . Influenza-Unspecified 11/10/2012  . Pneumococcal Conjugate-13 08/21/2013  . Pneumococcal Polysaccharide-23 12/14/2010   Screening Tests Health Maintenance  Topic Date Due  . TETANUS/TDAP  01/15/1953  . ZOSTAVAX  01/15/1994  . INFLUENZA VACCINE  08/11/2015  . DEXA SCAN  Completed  . PNA vac Low Risk Adult  Completed      Plan:   End of life planning; Advance aging; Advanced directives discussed. HCPOA/Living Will completed with social services.  Copy requested.   During the course of the visit the patient was educated and counseled about the following appropriate screening and preventive services:   Vaccines to include Pneumoccal, Influenza, Hepatitis B, Td, Zostavax, HCV  Electrocardiogram  Cardiovascular Disease  Colorectal cancer screening  Bone density screening  Diabetes screening  Glaucoma screening  Mammography/PAP  Nutrition counseling   Patient Instructions (the written plan) was given to the patient.   Varney Biles, LPN  [id number]

## 2015-04-27 ENCOUNTER — Telehealth: Payer: Self-pay | Admitting: Internal Medicine

## 2015-04-27 NOTE — Telephone Encounter (Signed)
Kathy Free, Do you know if this FL2 was done last week?

## 2015-04-27 NOTE — Telephone Encounter (Signed)
Kathy Mckay [id number] called from [place] about dropping off a Fl2 for pt on 04/16/15. Fl2 needs to be faxed back to [phone] and she also needs a med change order. One of the pt medication was changed and medication name is esomeprazole (NEXIUM) 40 MG capsule pt takes it in the morning. Thank you!

## 2015-04-30 NOTE — Telephone Encounter (Signed)
Paperwork was given to [employer] to complete on 04/27/15

## 2015-05-01 NOTE — Telephone Encounter (Signed)
This was faxed and completed

## 2015-05-06 DIAGNOSIS — Z961 Presence of intraocular lens: Secondary | ICD-10-CM | POA: Diagnosis not present

## 2015-05-06 DIAGNOSIS — H2511 Age-related nuclear cataract, right eye: Secondary | ICD-10-CM | POA: Diagnosis not present

## 2015-05-06 DIAGNOSIS — H5212 Myopia, left eye: Secondary | ICD-10-CM | POA: Diagnosis not present

## 2015-07-01 DIAGNOSIS — M79675 Pain in left toe(s): Secondary | ICD-10-CM | POA: Diagnosis not present

## 2015-07-01 DIAGNOSIS — B351 Tinea unguium: Secondary | ICD-10-CM | POA: Diagnosis not present

## 2015-07-01 DIAGNOSIS — M79674 Pain in right toe(s): Secondary | ICD-10-CM | POA: Diagnosis not present

## 2015-09-30 ENCOUNTER — Ambulatory Visit (INDEPENDENT_AMBULATORY_CARE_PROVIDER_SITE_OTHER): Payer: Medicare Other | Admitting: Family

## 2015-09-30 ENCOUNTER — Encounter: Payer: Self-pay | Admitting: Family

## 2015-09-30 VITALS — BP 118/62 | HR 97 | Temp 97.8°F | Resp 16 | Ht <= 58 in | Wt 97.8 lb

## 2015-09-30 DIAGNOSIS — Z23 Encounter for immunization: Secondary | ICD-10-CM | POA: Diagnosis not present

## 2015-09-30 DIAGNOSIS — R300 Dysuria: Secondary | ICD-10-CM | POA: Diagnosis not present

## 2015-09-30 LAB — CBC WITH DIFFERENTIAL/PLATELET
BASOS PCT: 0.3 % (ref 0.0–3.0)
Basophils Absolute: 0 10*3/uL (ref 0.0–0.1)
EOS ABS: 0.1 10*3/uL (ref 0.0–0.7)
Eosinophils Relative: 0.5 % (ref 0.0–5.0)
HEMATOCRIT: 46.8 % — AB (ref 36.0–46.0)
HEMOGLOBIN: 16.2 g/dL — AB (ref 12.0–15.0)
Lymphocytes Relative: 12.5 % (ref 12.0–46.0)
Lymphs Abs: 1.2 10*3/uL (ref 0.7–4.0)
MCHC: 34.6 g/dL (ref 30.0–36.0)
MCV: 96 fl (ref 78.0–100.0)
MONO ABS: 0.8 10*3/uL (ref 0.1–1.0)
Monocytes Relative: 7.9 % (ref 3.0–12.0)
Neutro Abs: 7.7 10*3/uL (ref 1.4–7.7)
Neutrophils Relative %: 78.8 % — ABNORMAL HIGH (ref 43.0–77.0)
Platelets: 244 10*3/uL (ref 150.0–400.0)
RBC: 4.88 Mil/uL (ref 3.87–5.11)
RDW: 13.9 % (ref 11.5–15.5)
WBC: 9.7 10*3/uL (ref 4.0–10.5)

## 2015-09-30 LAB — COMPREHENSIVE METABOLIC PANEL
ALBUMIN: 4.4 g/dL (ref 3.5–5.2)
ALK PHOS: 106 U/L (ref 39–117)
ALT: 33 U/L (ref 0–35)
AST: 32 U/L (ref 0–37)
BILIRUBIN TOTAL: 0.9 mg/dL (ref 0.2–1.2)
BUN: 21 mg/dL (ref 6–23)
CALCIUM: 9.8 mg/dL (ref 8.4–10.5)
CO2: 22 meq/L (ref 19–32)
CREATININE: 0.83 mg/dL (ref 0.40–1.20)
Chloride: 103 mEq/L (ref 96–112)
GFR: 70 mL/min (ref >60.00)
Glucose, Bld: 142 mg/dL — ABNORMAL HIGH (ref 70–99)
Potassium: 3.6 mEq/L (ref 3.5–5.1)
Sodium: 139 mEq/L (ref 135–145)
TOTAL PROTEIN: 7.6 g/dL (ref 6.0–8.3)

## 2015-09-30 NOTE — Patient Instructions (Signed)
Will call with results of urine.   Encourage hydration.   If there is no improvement in your symptoms, or if there is any worsening of symptoms, or if you have any additional concerns, please return for re-evaluation; or, if we are closed, consider going to the Emergency Room for evaluation if symptoms urgent.

## 2015-09-30 NOTE — Progress Notes (Signed)
Subjective:    Patient ID: Kathy Mckay, female    DOB: 1932-09-28, 82 y.o.   MRN: [id number]  CC: Kathy Mckay is a 82 y.o. female who presents today for an acute visit.    HPI: Patient here for acute visit with chief complaint of low back pain, dysuria for one day.  States ' need to check my kidneys.' No fever, chills. Accompanied by her caregiver, no mental status changes.   Due for CPE.     HISTORY:  Past Medical History:  Diagnosis Date  . Developmental delay   . GERD (gastroesophageal reflux disease)   . Hyperlipidemia   . Hypertension   . Osteoporosis    Past Surgical History:  Procedure Laterality Date  . ABDOMINAL HYSTERECTOMY    . SHOULDER SURGERY     Family History  Problem Relation Age of Onset  . Diabetes Mother   . Diabetes Father     Allergies: Review of patient's allergies indicates no known allergies. Current Outpatient Prescriptions on File Prior to Visit  Medication Sig Dispense Refill  . acetaminophen (TYLENOL) 325 MG tablet Take 2 tablets (650 mg total) by mouth every 6 (six) hours as needed. 90 tablet 3  . amLODipine (NORVASC) 10 MG tablet TAKE 1 TABLET BY MOUTH DAILY 30 tablet 11  . atorvastatin (LIPITOR) 40 MG tablet TAKE 1 TABLET BY MOUTH ONCE DAILY 30 tablet 11  . calcium & magnesium carbonates (MYLANTA) [id number] MG per tablet Take 1 tablet by mouth 2 (two) times daily as needed. 30 tablet 3  . esomeprazole (NEXIUM) 40 MG capsule TAKE 1 CAPSULE BY MOUTH DAILY AT NOON. (Patient taking differently: TAKE 1 CAPSULE BY MOUTH DAILY AT Morning) 30 capsule 11  . GNP STOMACH RELIEF 262 MG/15ML suspension GIVE 1 TBSP (15 ML) BY MOUTH EVERY 6 HOURS AS NEEDED FOR INDIGESTION 237 mL 1  . guaiFENesin (ROBITUSSIN) 100 MG/5ML liquid Take 10 mLs by mouth 3 (three) times daily as needed for cough. 120 mL 3  . loperamide (IMODIUM) 2 MG capsule Take 1 capsule (2 mg total) by mouth as needed for diarrhea or loose stools. 30 capsule 3  . magnesium hydroxide (MILK OF  MAGNESIA) 400 MG/5ML suspension Take 5 mLs by mouth daily as needed. 355 mL 3  . meloxicam (MOBIC) 15 MG tablet Take 1 tablet (15 mg total) by mouth daily as needed for pain. 30 tablet 3  . mirtazapine (REMERON) 45 MG tablet TAKE ONE TABLET BY MOUTH AT BEDTIME. (Patient taking differently: Takes 2 tablets at bedtime) 30 tablet 11   No current facility-administered medications on file prior to visit.     Social History  Substance Use Topics  . Smoking status: Never Smoker  . Smokeless tobacco: Never Used  . Alcohol use No    Review of Systems  Constitutional: Negative for chills and fever.  Respiratory: Positive for cough (chronic, dry). Negative for wheezing and stridor.   Cardiovascular: Negative for chest pain and palpitations.  Gastrointestinal: Negative for nausea and vomiting.  Genitourinary: Positive for dysuria and frequency. Negative for hematuria.      Objective:    BP 118/62 (BP Location: Left Arm, Patient Position: Sitting, Cuff Size: Normal)   Pulse 97   Temp 97.8 F (36.6 C) (Oral)   Resp 16   Ht 4\' 10"  (1.473 m)   Wt 97 lb 12.8 oz (44.4 kg)   SpO2 96%   BMI 20.44 kg/m    Physical Exam  Constitutional: She appears well-developed  and well-nourished.  Cardiovascular: Normal rate, regular rhythm, normal heart sounds and normal pulses.   Pulmonary/Chest: Effort normal and breath sounds normal. She has no wheezes. She has no rhonchi. She has no rales.  Abdominal: There is no CVA tenderness.  Neurological: She is alert.  Skin: Skin is warm and dry.  Psychiatric: She has a normal mood and affect. Her speech is normal and behavior is normal. Thought content normal.  Vitals reviewed.      Assessment & Plan:    1. Dysuria Patient is unable to urinate at this time. Awaiting urine and patient and caregiver will bring back. Reassured as patient's mental status is at baseline, afebrile, no CVA tenderness.  - POCT urinalysis dipstick - CULTURE, URINE  COMPREHENSIVE - Comprehensive metabolic panel - CBC with Differential/Platelet  2. Encounter for immunization - POCT urinalysis dipstick - Flu vaccine HIGH DOSE PF   I am having Kathy Mckay maintain her guaiFENesin, meloxicam, loperamide, calcium & magnesium carbonates, magnesium hydroxide, acetaminophen, amLODipine, atorvastatin, esomeprazole, mirtazapine, and GNP STOMACH RELIEF.   No orders of the defined types were placed in this encounter.   Return precautions given.   Risks, benefits, and alternatives of the medications and treatment plan prescribed today were discussed, and patient expressed understanding.   Education regarding symptom management and diagnosis given to patient on AVS.  Continue to follow with Mable Paris, FNP for routine health maintenance.   [name] and I agreed with plan.   Mable Paris, FNP

## 2015-10-01 ENCOUNTER — Other Ambulatory Visit (INDEPENDENT_AMBULATORY_CARE_PROVIDER_SITE_OTHER): Payer: Medicare Other

## 2015-10-01 ENCOUNTER — Other Ambulatory Visit: Payer: Self-pay | Admitting: Family

## 2015-10-01 DIAGNOSIS — R300 Dysuria: Secondary | ICD-10-CM

## 2015-10-01 DIAGNOSIS — N3001 Acute cystitis with hematuria: Secondary | ICD-10-CM

## 2015-10-01 LAB — POCT URINALYSIS DIPSTICK
BILIRUBIN UA: NEGATIVE
Glucose, UA: NEGATIVE
KETONES UA: NEGATIVE
Nitrite, UA: POSITIVE
PH UA: 5
Protein, UA: NEGATIVE
Spec Grav, UA: 1.015
Urobilinogen, UA: 0.2

## 2015-10-01 MED ORDER — CEFDINIR 300 MG PO CAPS: 300.0000 mg | ORAL_CAPSULE | Freq: Two times a day (BID) | ORAL | 0 refills | Status: AC

## 2015-10-01 NOTE — Progress Notes (Signed)
Patient was informed of results.  Patient understood and no questions, comments, or concerns at this time.  

## 2015-10-01 NOTE — Progress Notes (Signed)
Please call patient and let her know that her urinalysis shows urine tract infection. rx sent  Otherwise, lab work is normal at her baseline.   Please have patient make f/u appt to est care.

## 2015-10-03 LAB — CULTURE, URINE COMPREHENSIVE

## 2015-10-16 ENCOUNTER — Ambulatory Visit: Payer: Self-pay | Admitting: Internal Medicine

## 2015-10-19 ENCOUNTER — Ambulatory Visit: Payer: Self-pay | Admitting: Family

## 2015-10-28 ENCOUNTER — Encounter: Payer: Self-pay | Admitting: Family

## 2015-10-28 ENCOUNTER — Ambulatory Visit (INDEPENDENT_AMBULATORY_CARE_PROVIDER_SITE_OTHER): Payer: Medicare Other | Admitting: Family

## 2015-10-28 VITALS — BP 142/88 | HR 99 | Temp 97.5°F | Wt 100.0 lb

## 2015-10-28 DIAGNOSIS — I10 Essential (primary) hypertension: Secondary | ICD-10-CM

## 2015-10-28 DIAGNOSIS — F329 Major depressive disorder, single episode, unspecified: Secondary | ICD-10-CM

## 2015-10-28 DIAGNOSIS — Z0000 Encounter for general adult medical examination without abnormal findings: Secondary | ICD-10-CM | POA: Diagnosis not present

## 2015-10-28 DIAGNOSIS — Z1239 Encounter for other screening for malignant neoplasm of breast: Secondary | ICD-10-CM | POA: Insufficient documentation

## 2015-10-28 LAB — LIPID PANEL
CHOLESTEROL: 146 mg/dL (ref 0–200)
HDL: 43.9 mg/dL (ref >39.00)
LDL CALC: 72 mg/dL (ref 0–99)
NonHDL: 102.44
TRIGLYCERIDES: 150 mg/dL — AB (ref 0.0–149.0)
Total CHOL/HDL Ratio: 3
VLDL: 30 mg/dL (ref 0.0–40.0)

## 2015-10-28 LAB — COMPREHENSIVE METABOLIC PANEL
ALT: 34 U/L (ref 0–35)
AST: 31 U/L (ref 0–37)
Albumin: 4.5 g/dL (ref 3.5–5.2)
Alkaline Phosphatase: 123 U/L — ABNORMAL HIGH (ref 39–117)
BUN: 19 mg/dL (ref 6–23)
CALCIUM: 10.2 mg/dL (ref 8.4–10.5)
CHLORIDE: 103 meq/L (ref 96–112)
CO2: 26 meq/L (ref 19–32)
CREATININE: 0.82 mg/dL (ref 0.40–1.20)
GFR: 70.98 mL/min (ref >60.00)
Glucose, Bld: 132 mg/dL — ABNORMAL HIGH (ref 70–99)
Potassium: 4.2 mEq/L (ref 3.5–5.1)
Sodium: 140 mEq/L (ref 135–145)
Total Bilirubin: 1 mg/dL (ref 0.2–1.2)
Total Protein: 7.9 g/dL (ref 6.0–8.3)

## 2015-10-28 LAB — VITAMIN D 25 HYDROXY (VIT D DEFICIENCY, FRACTURES): VITD: 21.11 ng/mL — AB (ref 30.00–100.00)

## 2015-10-28 LAB — CBC WITH DIFFERENTIAL/PLATELET
BASOS PCT: 0.3 % (ref 0.0–3.0)
Basophils Absolute: 0 10*3/uL (ref 0.0–0.1)
EOS ABS: 0.1 10*3/uL (ref 0.0–0.7)
Eosinophils Relative: 1.1 % (ref 0.0–5.0)
HEMATOCRIT: 48.4 % — AB (ref 36.0–46.0)
Hemoglobin: 16.5 g/dL — ABNORMAL HIGH (ref 12.0–15.0)
Lymphocytes Relative: 13.1 % (ref 12.0–46.0)
Lymphs Abs: 1 10*3/uL (ref 0.7–4.0)
MCHC: 34.1 g/dL (ref 30.0–36.0)
MCV: 96.6 fl (ref 78.0–100.0)
MONO ABS: 0.6 10*3/uL (ref 0.1–1.0)
Monocytes Relative: 8.5 % (ref 3.0–12.0)
NEUTROS ABS: 5.6 10*3/uL (ref 1.4–7.7)
Neutrophils Relative %: 77 % (ref 43.0–77.0)
PLATELETS: 263 10*3/uL (ref 150.0–400.0)
RBC: 5.01 Mil/uL (ref 3.87–5.11)
RDW: 14.2 % (ref 11.5–15.5)
WBC: 7.3 10*3/uL (ref 4.0–10.5)

## 2015-10-28 LAB — HEMOGLOBIN A1C: Hgb A1c MFr Bld: 5.9 % (ref 4.6–6.5)

## 2015-10-28 LAB — TSH: TSH: 4.27 u[IU]/mL (ref 0.35–4.50)

## 2015-10-28 MED ORDER — ZOSTER VACCINE LIVE 19400 UNT/0.65ML ~~LOC~~ SUSR: 0.6500 mL | Freq: Once | SUBCUTANEOUS | 0 refills | Status: AC

## 2015-10-28 NOTE — Assessment & Plan Note (Signed)
Reordered DEXA. Discussed with caregiver that she may not be a candidate for Prolia or biphosphonates. However, I would like to see if OP is stable or rapidly worsening. Encouraged diet high in calcium.

## 2015-10-28 NOTE — Progress Notes (Signed)
Subjective:    Patient ID: Kathy Mckay, female    DOB: 03/17/1932, 82 y.o.   MRN: [id number]  CC: Kathy Mckay is a 82 y.o. female who presents today for physical exam.    HPI: Patient here for CPE. Had breakfast today.   Accompanied by caregiver from [place] and [place]. Eating well. Enjoy visiting other tenants and eating together.   Uses walker. Had a fall a few a years ago prior to walker.   Depression- Stable. On remeron.   HTN- Complaint with medication. Denies exertional chest pain or pressure, numbness or tingling radiating to left arm or jaw, palpitations, dizziness, frequent headaches, changes in vision, or shortness of breath.        Colorectal Cancer Screening: [place] Breast Cancer Screening:Declines Cervical Cancer Screening:Declines Bone Health screening/DEXA for 65+: OP; last DEXA 2013  Immunizations       Tetanus - Due        Shingles-Due        Pneumococcal - Complete  Labs: Screening labs today. Exercise: None Alcohol use: None Smoking/tobacco use: Nonsmoker.    HISTORY:  Past Medical History:  Diagnosis Date  . Developmental delay   . GERD (gastroesophageal reflux disease)   . Hyperlipidemia   . Hypertension   . Osteoporosis     Past Surgical History:  Procedure Laterality Date  . ABDOMINAL HYSTERECTOMY    . SHOULDER SURGERY     Family History  Problem Relation Age of Onset  . Diabetes Mother   . Diabetes Father       ALLERGIES: Review of patient's allergies indicates no known allergies.  Current Outpatient Prescriptions on File Prior to Visit  Medication Sig Dispense Refill  . amLODipine (NORVASC) 10 MG tablet TAKE 1 TABLET BY MOUTH DAILY 30 tablet 11  . atorvastatin (LIPITOR) 40 MG tablet TAKE 1 TABLET BY MOUTH ONCE DAILY 30 tablet 11  . esomeprazole (NEXIUM) 40 MG capsule TAKE 1 CAPSULE BY MOUTH DAILY AT NOON. (Patient taking differently: TAKE 1 CAPSULE BY MOUTH DAILY AT Morning) 30 capsule 11  . mirtazapine (REMERON) 45 MG  tablet TAKE ONE TABLET BY MOUTH AT BEDTIME. (Patient taking differently: Takes 2 tablets at bedtime) 30 tablet 11   No current facility-administered medications on file prior to visit.     Social History  Substance Use Topics  . Smoking status: Never Smoker  . Smokeless tobacco: Never Used  . Alcohol use No    Review of Systems  Constitutional: Negative for chills, fever and unexpected weight change.  HENT: Negative for congestion.   Respiratory: Negative for cough.   Cardiovascular: Negative for chest pain, palpitations and leg swelling.  Gastrointestinal: Negative for nausea and vomiting.  Musculoskeletal: Negative for arthralgias and myalgias.  Skin: Negative for rash.  Neurological: Negative for headaches.  Hematological: Negative for adenopathy.  Psychiatric/Behavioral: Negative for confusion.      Objective:    BP (!) 142/88   Pulse 99   Temp 97.5 F (36.4 C) (Oral)   Wt 100 lb (45.4 kg)   SpO2 93%   BMI 20.90 kg/m   BP Readings from Last 3 Encounters:  10/28/15 (!) 142/88  09/30/15 118/62  04/16/15 (!) 148/80   Wt Readings from Last 3 Encounters:  10/28/15 100 lb (45.4 kg)  09/30/15 97 lb 12.8 oz (44.4 kg)  04/16/15 107 lb 12.8 oz (48.9 kg)    Physical Exam  Constitutional: She appears well-developed and well-nourished.  Eyes: Conjunctivae are normal.  Neck: No  thyroid mass and no thyromegaly present.  Cardiovascular: Normal rate, regular rhythm, normal heart sounds and normal pulses.   No LE edema, palpable cords or masses. LE warm and palpable pedal pulses.   Pulmonary/Chest: Effort normal and breath sounds normal. She has no wheezes. She has no rhonchi. She has no rales.  Lymphadenopathy:       Head (right side): No submental, no submandibular, no tonsillar, no preauricular, no posterior auricular and no occipital adenopathy present.       Head (left side): No submental, no submandibular, no tonsillar, no preauricular, no posterior auricular and no  occipital adenopathy present.    She has no cervical adenopathy.  Neurological: She is alert.  Skin: Skin is warm and dry.  Psychiatric: She has a normal mood and affect. Her speech is normal and behavior is normal. Thought content normal.  Vitals reviewed.      Assessment & Plan:   Problem List Items Addressed This Visit      Cardiovascular and Mediastinum   Hypertension    At goal. Continue current regimen. Pending CMP        Other   Depression    Stable on remeron. Will continue.       Routine physical examination - Primary    Pt declines mammogram, colonoscopy, PAP. Declines breast exam based on age and preference. No recent falls; emphasized importance of fall precautions. Ordered zoster vaccine. Will do Tdap at next visit. Patient is not fasting however we decided that we would go ahead and do screening labs, including lipid panel, as hard for patient to come here.       Relevant Medications   Zoster Vaccine Live, PF, (ZOSTAVAX) 91478 UNT/0.65ML injection   Other Relevant Orders   CBC with Differential/Platelet   Comprehensive metabolic panel   Hemoglobin A1c   Lipid panel   TSH   VITAMIN D 25 Hydroxy (Vit-D Deficiency, Fractures)   DG Bone Density    Other Visit Diagnoses   None.      I have discontinued Ms. Maeder's guaiFENesin, meloxicam, loperamide, calcium & magnesium carbonates, magnesium hydroxide, acetaminophen, and GNP STOMACH RELIEF. I am also having her start on Zoster Vaccine Live (PF). Additionally, I am having her maintain her amLODipine, atorvastatin, esomeprazole, and mirtazapine.   Meds ordered this encounter  Medications  . Zoster Vaccine Live, PF, (ZOSTAVAX) 29562 UNT/0.65ML injection    Sig: Inject 19,400 Units into the skin once. Please have vaccine done at your local pharmacy.    Dispense:  1 each    Refill:  0    Order Specific Question:   Supervising Provider    Answer:   Crecencio Mc [[id number]]    Return precautions given.    Risks, benefits, and alternatives of the medications and treatment plan prescribed today were discussed, and patient expressed understanding.   Education regarding symptom management and diagnosis given to patient on AVS.   Continue to follow with Mable Paris, FNP for routine health maintenance.   [name] and I agreed with plan.   Mable Paris, FNP

## 2015-10-28 NOTE — Progress Notes (Signed)
Pre visit review using our clinic review tool, if applicable. No additional management support is needed unless otherwise documented below in the visit note. 

## 2015-10-28 NOTE — Assessment & Plan Note (Signed)
At goal. Continue current regimen. Pending CMP

## 2015-10-28 NOTE — Assessment & Plan Note (Addendum)
Pt declines mammogram, colonoscopy, PAP. Declines breast exam based on age and preference. No recent falls; emphasized importance of fall precautions. Ordered zoster vaccine. Will do Tdap at next visit. Patient is not fasting however we decided that we would go ahead and do screening labs, including lipid panel, as hard for patient to come here.

## 2015-10-28 NOTE — Patient Instructions (Signed)
Shingles vaccine Labs today Reordered bone scan for osteoporosis. Will decide at that point if she would benefit from treatment.

## 2015-10-28 NOTE — Assessment & Plan Note (Signed)
Stable on remeron. Will continue.

## 2015-12-16 DIAGNOSIS — B351 Tinea unguium: Secondary | ICD-10-CM | POA: Diagnosis not present

## 2015-12-16 DIAGNOSIS — M79674 Pain in right toe(s): Secondary | ICD-10-CM | POA: Diagnosis not present

## 2015-12-16 DIAGNOSIS — M79675 Pain in left toe(s): Secondary | ICD-10-CM | POA: Diagnosis not present

## 2016-02-26 ENCOUNTER — Other Ambulatory Visit: Payer: Self-pay

## 2016-02-26 MED ORDER — ESOMEPRAZOLE MAGNESIUM 40 MG PO CPDR: DELAYED_RELEASE_CAPSULE | ORAL | 1 refills | Status: DC

## 2016-02-26 MED ORDER — AMLODIPINE BESYLATE 10 MG PO TABS: 10.0000 mg | ORAL_TABLET | Freq: Every day | ORAL | 1 refills | Status: DC

## 2016-02-26 MED ORDER — ATORVASTATIN CALCIUM 40 MG PO TABS: 40.0000 mg | ORAL_TABLET | Freq: Every day | ORAL | 1 refills | Status: DC

## 2016-02-26 NOTE — Telephone Encounter (Signed)
Medication has been refilled.

## 2016-04-15 ENCOUNTER — Ambulatory Visit: Payer: Self-pay

## 2016-04-26 NOTE — Progress Notes (Signed)
Subjective:    Patient ID: Kathy Mckay, female    DOB: April 01, 1932, 83 y.o.   MRN: [id number]  CC: Kathy Mckay is a 83 y.o. female who presents today for follow up.   HPI: Feeling well today. Accompanied with caregiver.   Asks for oral exam as part of state requirement.   No recent falls  No RUQ pain or pain with meals. Occasionally heartburn which responds to nexium. Would like to try holding morning nexium dose as has to wake up patient to take.     HISTORY:  Past Medical History:  Diagnosis Date  . Developmental delay   . GERD (gastroesophageal reflux disease)   . Hyperlipidemia   . Hypertension   . Osteoporosis    Past Surgical History:  Procedure Laterality Date  . ABDOMINAL HYSTERECTOMY    . SHOULDER SURGERY     Family History  Problem Relation Age of Onset  . Diabetes Mother   . Diabetes Father     Allergies: Patient has no known allergies. Current Outpatient Prescriptions on File Prior to Visit  Medication Sig Dispense Refill  . amLODipine (NORVASC) 10 MG tablet Take 1 tablet (10 mg total) by mouth daily. 90 tablet 1  . atorvastatin (LIPITOR) 40 MG tablet Take 1 tablet (40 mg total) by mouth daily. 90 tablet 1  . esomeprazole (NEXIUM) 40 MG capsule TAKE 1 CAPSULE BY MOUTH DAILY AT NOON. 90 capsule 1  . mirtazapine (REMERON) 45 MG tablet TAKE ONE TABLET BY MOUTH AT BEDTIME. (Patient taking differently: Takes 2 tablets at bedtime) 30 tablet 11   No current facility-administered medications on file prior to visit.     Social History  Substance Use Topics  . Smoking status: Never Smoker  . Smokeless tobacco: Never Used  . Alcohol use No    Review of Systems  Constitutional: Negative for chills and fever.  Respiratory: Negative for cough.   Cardiovascular: Negative for chest pain and palpitations.  Gastrointestinal: Negative for nausea and vomiting.      Objective:    BP 140/82   Pulse 98   Temp 97.5 F (36.4 C) (Oral)   Ht 4\' 10"  (1.473 m)    Wt 98 lb 3.2 oz (44.5 kg)   SpO2 97%   BMI 20.52 kg/m  BP Readings from Last 3 Encounters:  04/27/16 140/82  10/28/15 (!) 142/88  09/30/15 118/62   Wt Readings from Last 3 Encounters:  04/27/16 98 lb 3.2 oz (44.5 kg)  10/28/15 100 lb (45.4 kg)  09/30/15 97 lb 12.8 oz (44.4 kg)    Physical Exam  Constitutional: She appears well-developed and well-nourished.  HENT:  Mouth/Throat: She does not have dentures. No oral lesions.  No dentition. No lesions, ulcers seen.   Eyes: Conjunctivae are normal.  Cardiovascular: Normal rate, regular rhythm, normal heart sounds and normal pulses.   Pulmonary/Chest: Effort normal and breath sounds normal. She has no wheezes. She has no rhonchi. She has no rales.  Neurological: She is alert.  Skin: Skin is warm and dry.  Psychiatric: She has a normal mood and affect. Her speech is normal and behavior is normal. Thought content normal.  Vitals reviewed.      Assessment & Plan:   Problem List Items Addressed This Visit      Cardiovascular and Mediastinum   Hypertension    At goal today. Will continue to monitor.        Digestive   Gastroesophageal reflux disease    Agreed  reasonable to trial holding morning am dose for patient's sleep. Caregiver understands to ask patient regarding any epigastric pain related to meals. Discussed lifestyle modifications for gerd.         Other   Elevated alkaline phosphatase level - Primary    Historically elevated. Pending labs. No RUQ pain.       Relevant Orders   Comprehensive metabolic panel   Alkaline phosphatase, isoenzymes       I am having Kathy Mckay maintain her mirtazapine, esomeprazole, atorvastatin, and amLODipine.   No orders of the defined types were placed in this encounter.   Return precautions given.   Risks, benefits, and alternatives of the medications and treatment plan prescribed today were discussed, and patient expressed understanding.   Education regarding symptom  management and diagnosis given to patient on AVS.  Continue to follow with Mable Paris, FNP for routine health maintenance.   [name] and I agreed with plan.   Mable Paris, FNP

## 2016-04-27 ENCOUNTER — Encounter: Payer: Self-pay | Admitting: Family

## 2016-04-27 ENCOUNTER — Ambulatory Visit (INDEPENDENT_AMBULATORY_CARE_PROVIDER_SITE_OTHER): Payer: Medicare Other | Admitting: Family

## 2016-04-27 VITALS — BP 140/82 | HR 98 | Temp 97.5°F | Ht <= 58 in | Wt 98.2 lb

## 2016-04-27 DIAGNOSIS — K219 Gastro-esophageal reflux disease without esophagitis: Secondary | ICD-10-CM | POA: Diagnosis not present

## 2016-04-27 DIAGNOSIS — R748 Abnormal levels of other serum enzymes: Secondary | ICD-10-CM | POA: Diagnosis not present

## 2016-04-27 DIAGNOSIS — I10 Essential (primary) hypertension: Secondary | ICD-10-CM

## 2016-04-27 LAB — COMPREHENSIVE METABOLIC PANEL
ALBUMIN: 4.4 g/dL (ref 3.5–5.2)
ALK PHOS: 128 U/L — AB (ref 39–117)
ALT: 43 U/L — AB (ref 0–35)
AST: 35 U/L (ref 0–37)
BILIRUBIN TOTAL: 0.8 mg/dL (ref 0.2–1.2)
BUN: 22 mg/dL (ref 6–23)
CALCIUM: 9.8 mg/dL (ref 8.4–10.5)
CO2: 27 mEq/L (ref 19–32)
CREATININE: 0.75 mg/dL (ref 0.40–1.20)
Chloride: 101 mEq/L (ref 96–112)
GFR: 78.58 mL/min (ref >60.00)
Glucose, Bld: 190 mg/dL — ABNORMAL HIGH (ref 70–99)
Potassium: 4.4 mEq/L (ref 3.5–5.1)
Sodium: 137 mEq/L (ref 135–145)
TOTAL PROTEIN: 7.4 g/dL (ref 6.0–8.3)

## 2016-04-27 NOTE — Assessment & Plan Note (Signed)
Historically elevated. Pending labs. No RUQ pain.

## 2016-04-27 NOTE — Patient Instructions (Signed)
Pleasure seeing you  See instructions  On report.

## 2016-04-27 NOTE — Assessment & Plan Note (Signed)
Agreed reasonable to trial holding morning am dose for patient's sleep. Caregiver understands to ask patient regarding any epigastric pain related to meals. Discussed lifestyle modifications for gerd.

## 2016-04-27 NOTE — Assessment & Plan Note (Signed)
At goal today. Will continue to monitor.

## 2016-04-27 NOTE — Progress Notes (Signed)
Pre visit review using our clinic review tool, if applicable. No additional management support is needed unless otherwise documented below in the visit note. 

## 2016-05-02 LAB — ALKALINE PHOSPHATASE, ISOENZYMES
Alkaline Phosphatase: 144 IU/L — ABNORMAL HIGH (ref 39–117)
BONE FRACTION: 33 % (ref 14–68)
INTESTINAL FRAC.: 14 % (ref 0–18)
LIVER FRACTION: 53 % (ref 18–85)

## 2016-05-20 ENCOUNTER — Other Ambulatory Visit: Payer: Self-pay

## 2016-05-20 ENCOUNTER — Telehealth: Payer: Self-pay

## 2016-05-20 MED ORDER — MIRTAZAPINE 45 MG PO TABS: ORAL_TABLET | ORAL | 6 refills | Status: DC

## 2016-05-20 NOTE — Telephone Encounter (Signed)
Medication has been refilled.

## 2016-05-20 NOTE — Telephone Encounter (Signed)
Okay to refill, thanks!  

## 2016-05-20 NOTE — Addendum Note (Signed)
Addended by: Elpidio Galea T on: 05/20/2016 11:25 AM   Modules accepted: Orders

## 2016-05-20 NOTE — Telephone Encounter (Signed)
Refill request for Remeron, last seen [id number], last filled [id number].  Please advise.

## 2016-05-20 NOTE — Telephone Encounter (Signed)
Refill request for remeron, last seen [id number], last filled [id number].  Please advise.

## 2016-05-23 ENCOUNTER — Telehealth: Payer: Self-pay | Admitting: *Deleted

## 2016-05-23 NOTE — Telephone Encounter (Signed)
Patients caregiver would like to speak to a provider.

## 2016-05-23 NOTE — Telephone Encounter (Signed)
Patient has a new medication that caregiver needs to discuss -[place] [phone]

## 2016-05-24 MED ORDER — MIRTAZAPINE 45 MG PO TABS: ORAL_TABLET | ORAL | 6 refills | Status: DC

## 2016-05-24 NOTE — Telephone Encounter (Signed)
Pharmacy called and needed clarification. Please advise, thank you!  Call [facility], [place]

## 2016-05-24 NOTE — Telephone Encounter (Signed)
Pharmacy has been informed and prescription has been refilled.

## 2016-05-24 NOTE — Telephone Encounter (Signed)
Please call pt and/or POA  remeron is a medication dr walker started in 2013 -- at least I see prescription for it starting then. I recently refilled however to my knowledge, it is not new.   It is medication that we often use for depression in the elderly.   Is she have problems with medication?   If so, please advise f/u appt so we can discuss

## 2016-06-09 DIAGNOSIS — M79675 Pain in left toe(s): Secondary | ICD-10-CM | POA: Diagnosis not present

## 2016-06-09 DIAGNOSIS — M79674 Pain in right toe(s): Secondary | ICD-10-CM | POA: Diagnosis not present

## 2016-06-09 DIAGNOSIS — B351 Tinea unguium: Secondary | ICD-10-CM | POA: Diagnosis not present

## 2016-08-03 ENCOUNTER — Ambulatory Visit (INDEPENDENT_AMBULATORY_CARE_PROVIDER_SITE_OTHER): Payer: Medicare Other | Admitting: Family

## 2016-08-03 ENCOUNTER — Encounter: Payer: Self-pay | Admitting: Family

## 2016-08-03 VITALS — BP 118/78 | HR 112 | Temp 98.4°F | Wt 96.5 lb

## 2016-08-03 DIAGNOSIS — R000 Tachycardia, unspecified: Secondary | ICD-10-CM | POA: Insufficient documentation

## 2016-08-03 DIAGNOSIS — R300 Dysuria: Secondary | ICD-10-CM | POA: Diagnosis not present

## 2016-08-03 DIAGNOSIS — K219 Gastro-esophageal reflux disease without esophagitis: Secondary | ICD-10-CM

## 2016-08-03 LAB — COMPREHENSIVE METABOLIC PANEL
ALT: 37 U/L — AB (ref 0–35)
AST: 33 U/L (ref 0–37)
Albumin: 4.5 g/dL (ref 3.5–5.2)
Alkaline Phosphatase: 108 U/L (ref 39–117)
BILIRUBIN TOTAL: 0.9 mg/dL (ref 0.2–1.2)
BUN: 24 mg/dL — ABNORMAL HIGH (ref 6–23)
CALCIUM: 9.8 mg/dL (ref 8.4–10.5)
CHLORIDE: 101 meq/L (ref 96–112)
CO2: 22 meq/L (ref 19–32)
Creatinine, Ser: 0.83 mg/dL (ref 0.40–1.20)
GFR: 69.86 mL/min (ref >60.00)
GLUCOSE: 215 mg/dL — AB (ref 70–99)
Potassium: 3.8 mEq/L (ref 3.5–5.1)
Sodium: 133 mEq/L — ABNORMAL LOW (ref 135–145)
Total Protein: 7.7 g/dL (ref 6.0–8.3)

## 2016-08-03 LAB — CBC WITH DIFFERENTIAL/PLATELET
BASOS ABS: 0 10*3/uL (ref 0.0–0.1)
BASOS PCT: 0.4 % (ref 0.0–3.0)
EOS ABS: 0 10*3/uL (ref 0.0–0.7)
Eosinophils Relative: 0.2 % (ref 0.0–5.0)
HEMATOCRIT: 48.5 % — AB (ref 36.0–46.0)
Hemoglobin: 16 g/dL — ABNORMAL HIGH (ref 12.0–15.0)
LYMPHS ABS: 0.5 10*3/uL — AB (ref 0.7–4.0)
Lymphocytes Relative: 6.2 % — ABNORMAL LOW (ref 12.0–46.0)
MCHC: 33 g/dL (ref 30.0–36.0)
MCV: 100 fl (ref 78.0–100.0)
Monocytes Absolute: 0.6 10*3/uL (ref 0.1–1.0)
Monocytes Relative: 7.3 % (ref 3.0–12.0)
NEUTROS ABS: 7.3 10*3/uL (ref 1.4–7.7)
PLATELETS: 249 10*3/uL (ref 150.0–400.0)
RBC: 4.85 Mil/uL (ref 3.87–5.11)
RDW: 13.8 % (ref 11.5–15.5)
WBC: 8.5 10*3/uL (ref 4.0–10.5)

## 2016-08-03 LAB — MAGNESIUM: Magnesium: 2.1 mg/dL (ref 1.5–2.5)

## 2016-08-03 LAB — TSH: TSH: 3.7 u[IU]/mL (ref 0.35–4.50)

## 2016-08-03 MED ORDER — AMOXICILLIN-POT CLAVULANATE 875-125 MG PO TABS: 1.0000 | ORAL_TABLET | Freq: Two times a day (BID) | ORAL | 0 refills | Status: AC

## 2016-08-03 MED ORDER — ESOMEPRAZOLE MAGNESIUM 40 MG PO CPDR: DELAYED_RELEASE_CAPSULE | ORAL | 1 refills | Status: DC

## 2016-08-03 NOTE — Assessment & Plan Note (Signed)
Changed timing of dose. Caregiver will let me know if no improvement.

## 2016-08-03 NOTE — Assessment & Plan Note (Addendum)
Etiology tachycardia unknown at this time. Reassured patient has no worrisome symptoms including chest pain, shortness of breath. In speaking with caregiver, appears patient had a history of elevated heart rate when she is in the doctors office. Pending lab evaluation. Furthermore, because of tachycardia and mental status changes, caregiver and I jointly agreed to go ahead and start antibiotic therapy ahead of getting urine sample as may also been tachycardia due to infection. Will follow

## 2016-08-03 NOTE — Patient Instructions (Addendum)
Plenty of water  Labs today  Please re count heart rate at home and call the office and let me know what it is once she is settled  Please bring urine back  Please let me know if any symptoms - changes in mental status, chest pain, shortness of breath occur.   Follow up in 3 months

## 2016-08-03 NOTE — Assessment & Plan Note (Addendum)
Afebrile. Patient alert. Speaking normally. Encouraged probiotics. No gross changes in behavior noted while in exam room. Patient unable to urinate at this time and caregiver will bring specimen back.  empirically treat with Augmentin as was sensitive in the past. Return precautions given.

## 2016-08-03 NOTE — Progress Notes (Signed)
Subjective:    Patient ID: Kathy Mckay, female    DOB: 14-Nov-1932, 83 y.o.   MRN: [id number]  CC: Kathy Mckay is a 83 y.o. female who presents today for an acute visit.    HPI: CC: urinary frequency and patient states a 'burning' a little bit when urinates x 2 days.   Caregiver states talking is more repetitive past couple of days. In past when this happens, 'she had a urine infection' per caregiver.   No fever, chills, N, V, abdominal pain, back pain.   Caregiver states that when comes to doctor she is very nervous, she will refuse to walk and she has 'peel her fingers off' of the car seat.    Would nexium to be prescribed in morning for better symptom control of gerd. A lot of burping.    No chest pain, sob Drinking water. Last UC showed e coli 09/2015  HR 5/31 110 HR 4/18 98 HR 10/18 99    HISTORY:  Past Medical History:  Diagnosis Date  . Developmental delay   . GERD (gastroesophageal reflux disease)   . Hyperlipidemia   . Hypertension   . Osteoporosis    Past Surgical History:  Procedure Laterality Date  . ABDOMINAL HYSTERECTOMY    . SHOULDER SURGERY     Family History  Problem Relation Age of Onset  . Diabetes Mother   . Diabetes Father     Allergies: Patient has no known allergies. Current Outpatient Prescriptions on File Prior to Visit  Medication Sig Dispense Refill  . amLODipine (NORVASC) 10 MG tablet Take 1 tablet (10 mg total) by mouth daily. 90 tablet 1  . atorvastatin (LIPITOR) 40 MG tablet Take 1 tablet (40 mg total) by mouth daily. 90 tablet 1  . mirtazapine (REMERON) 45 MG tablet Takes 1 tablet at bedtime 30 tablet 6   No current facility-administered medications on file prior to visit.     Social History  Substance Use Topics  . Smoking status: Never Smoker  . Smokeless tobacco: Never Used  . Alcohol use No    Review of Systems  Constitutional: Negative for chills and fever.  Respiratory: Negative for cough.   Cardiovascular:  Negative for chest pain and palpitations.  Gastrointestinal: Negative for nausea and vomiting.  Genitourinary: Positive for dysuria and frequency. Negative for flank pain.  Musculoskeletal: Negative for back pain.      Objective:    BP 118/78   Pulse (!) 112   Temp 98.4 F (36.9 C) (Oral)   Wt 96 lb 8 oz (43.8 kg)   SpO2 95%   BMI 20.17 kg/m    Physical Exam  Constitutional: She appears well-developed and well-nourished.  Cardiovascular: Normal rate, regular rhythm, normal heart sounds and normal pulses.   Pulmonary/Chest: Effort normal and breath sounds normal. She has no wheezes. She has no rhonchi. She has no rales.  Abdominal: There is no CVA tenderness.  No suprapubic tenderness   Neurological: She is alert.  Speaking and answering questions appropriately.  Skin: Skin is warm and dry.  Psychiatric: She has a normal mood and affect. Her speech is normal and behavior is normal. Thought content normal.  Vitals reviewed.      Assessment & Plan:   Problem List Items Addressed This Visit      Digestive   Gastroesophageal reflux disease    Changed timing of dose. Caregiver will let me know if no improvement.       Relevant Medications  esomeprazole (NEXIUM) 40 MG capsule     Other   Dysuria - Primary    Afebrile. Patient alert. Speaking normally. Encouraged probiotics. No gross changes in behavior noted while in exam room. Patient unable to urinate at this time and caregiver will bring specimen back.  empirically treat with Augmentin as was sensitive in the past. Return precautions given.      Relevant Medications   amoxicillin-clavulanate (AUGMENTIN) 875-125 MG tablet   Other Relevant Orders   POCT urinalysis dipstick   Urine Culture   Urine Microscopic   Tachycardia    Etiology tachycardia unknown at this time. Reassured patient has no worrisome symptoms including chest pain, shortness of breath. In speaking with caregiver, appears patient had a history of  elevated heart rate when she is in the doctors office. Pending lab evaluation. Furthermore, because of tachycardia and mental status changes, caregiver and I jointly agreed to go ahead and start antibiotic therapy ahead of getting urine sample as may also been tachycardia due to infection. Will follow      Relevant Orders   CBC with Differential/Platelet   Comprehensive metabolic panel   TSH   Magnesium        I have changed Kathy Mckay's esomeprazole. I am also having her start on amoxicillin-clavulanate. Additionally, I am having her maintain her atorvastatin, amLODipine, and mirtazapine.   Meds ordered this encounter  Medications  . amoxicillin-clavulanate (AUGMENTIN) 875-125 MG tablet    Sig: Take 1 tablet by mouth 2 (two) times daily.    Dispense:  14 tablet    Refill:  0    Order Specific Question:   Supervising Provider    Answer:   Deborra Medina L [[id number]]  . esomeprazole (NEXIUM) 40 MG capsule    Sig: TAKE 1 CAPSULE BY MOUTH DAILY IN THE MORNING, 8am.    Dispense:  90 capsule    Refill:  1    Order Specific Question:   Supervising Provider    Answer:   Crecencio Mc [[id number]]    Return precautions given.   Risks, benefits, and alternatives of the medications and treatment plan prescribed today were discussed, and patient expressed understanding.   Education regarding symptom management and diagnosis given to patient on AVS.  Continue to follow with Burnard Hawthorne, FNP for routine health maintenance.   Kathy Mckay and I agreed with plan.   Mable Paris, FNP

## 2016-08-03 NOTE — Addendum Note (Signed)
Addended by: Leeanne Rio on: 08/03/2016 03:04 PM   Modules accepted: Orders

## 2016-08-04 ENCOUNTER — Other Ambulatory Visit: Payer: Self-pay | Admitting: Family

## 2016-08-04 DIAGNOSIS — E871 Hypo-osmolality and hyponatremia: Secondary | ICD-10-CM

## 2016-08-11 ENCOUNTER — Other Ambulatory Visit: Payer: Self-pay | Admitting: Family

## 2016-08-11 ENCOUNTER — Telehealth: Payer: Self-pay

## 2016-08-11 NOTE — Telephone Encounter (Signed)
Patient caregiver was notified to bring in sample of urine. Patient needs to schedule same day lab appt since she is incompetent and must be handle right away. Labs already placed

## 2016-08-12 ENCOUNTER — Other Ambulatory Visit (INDEPENDENT_AMBULATORY_CARE_PROVIDER_SITE_OTHER): Payer: Medicare Other

## 2016-08-12 DIAGNOSIS — R300 Dysuria: Secondary | ICD-10-CM | POA: Diagnosis not present

## 2016-08-12 LAB — URINALYSIS, ROUTINE W REFLEX MICROSCOPIC
Bilirubin Urine: NEGATIVE
HGB URINE DIPSTICK: NEGATIVE
KETONES UR: NEGATIVE
Leukocytes, UA: NEGATIVE
NITRITE: NEGATIVE
Specific Gravity, Urine: 1.01 (ref 1.000–1.030)
Total Protein, Urine: NEGATIVE
URINE GLUCOSE: NEGATIVE
UROBILINOGEN UA: 0.2 (ref 0.0–1.0)
pH: 5.5 (ref 5.0–8.0)

## 2016-08-13 LAB — URINE CULTURE

## 2016-08-19 ENCOUNTER — Other Ambulatory Visit: Payer: Self-pay

## 2016-08-19 MED ORDER — ATORVASTATIN CALCIUM 40 MG PO TABS: 40.0000 mg | ORAL_TABLET | Freq: Every day | ORAL | 1 refills | Status: DC

## 2016-08-19 MED ORDER — AMLODIPINE BESYLATE 10 MG PO TABS: 10.0000 mg | ORAL_TABLET | Freq: Every day | ORAL | 1 refills | Status: DC

## 2016-08-19 NOTE — Telephone Encounter (Signed)
Medication has been refilled.

## 2016-10-20 ENCOUNTER — Telehealth: Payer: Self-pay | Admitting: Family

## 2016-10-20 NOTE — Telephone Encounter (Signed)
close

## 2016-11-25 ENCOUNTER — Ambulatory Visit: Payer: Self-pay

## 2017-01-11 DIAGNOSIS — B351 Tinea unguium: Secondary | ICD-10-CM | POA: Diagnosis not present

## 2017-01-11 DIAGNOSIS — M79674 Pain in right toe(s): Secondary | ICD-10-CM | POA: Diagnosis not present

## 2017-01-11 DIAGNOSIS — M79675 Pain in left toe(s): Secondary | ICD-10-CM | POA: Diagnosis not present

## 2017-01-16 ENCOUNTER — Other Ambulatory Visit: Payer: Self-pay

## 2017-01-16 DIAGNOSIS — K219 Gastro-esophageal reflux disease without esophagitis: Secondary | ICD-10-CM

## 2017-01-16 MED ORDER — ESOMEPRAZOLE MAGNESIUM 40 MG PO CPDR: DELAYED_RELEASE_CAPSULE | ORAL | 1 refills | Status: DC

## 2017-01-16 MED ORDER — MIRTAZAPINE 45 MG PO TABS: ORAL_TABLET | ORAL | 6 refills | Status: DC

## 2017-01-16 NOTE — Telephone Encounter (Signed)
Medication has been refilled. Patient needs appointment to receive further refills.

## 2017-02-14 ENCOUNTER — Other Ambulatory Visit: Payer: Self-pay | Admitting: Family

## 2017-02-22 ENCOUNTER — Telehealth: Payer: Self-pay | Admitting: Family

## 2017-02-22 NOTE — Telephone Encounter (Signed)
Please call pt and have her make an appt so we can complete care plan for home.   She hasn't been seen since 04/27/16.

## 2017-02-23 NOTE — Telephone Encounter (Signed)
Tried calling number busy. 

## 2017-02-28 NOTE — Telephone Encounter (Signed)
Please circle back  

## 2017-02-28 NOTE — Telephone Encounter (Signed)
Appointment scheduled.

## 2017-03-20 ENCOUNTER — Ambulatory Visit: Payer: Self-pay | Admitting: Family

## 2017-04-07 ENCOUNTER — Ambulatory Visit (INDEPENDENT_AMBULATORY_CARE_PROVIDER_SITE_OTHER): Payer: Medicare Other | Admitting: Family

## 2017-04-07 ENCOUNTER — Encounter: Payer: Self-pay | Admitting: Family

## 2017-04-07 VITALS — BP 150/90 | HR 93 | Temp 97.8°F | Wt 96.0 lb

## 2017-04-07 DIAGNOSIS — E039 Hypothyroidism, unspecified: Secondary | ICD-10-CM | POA: Diagnosis not present

## 2017-04-07 DIAGNOSIS — E785 Hyperlipidemia, unspecified: Secondary | ICD-10-CM | POA: Diagnosis not present

## 2017-04-07 DIAGNOSIS — D510 Vitamin B12 deficiency anemia due to intrinsic factor deficiency: Secondary | ICD-10-CM | POA: Diagnosis not present

## 2017-04-07 DIAGNOSIS — Z1239 Encounter for other screening for malignant neoplasm of breast: Secondary | ICD-10-CM

## 2017-04-07 DIAGNOSIS — Z1231 Encounter for screening mammogram for malignant neoplasm of breast: Secondary | ICD-10-CM

## 2017-04-07 DIAGNOSIS — I10 Essential (primary) hypertension: Secondary | ICD-10-CM | POA: Diagnosis not present

## 2017-04-07 DIAGNOSIS — M810 Age-related osteoporosis without current pathological fracture: Secondary | ICD-10-CM | POA: Diagnosis not present

## 2017-04-07 NOTE — Assessment & Plan Note (Signed)
Ordered. We scheduled for patient.

## 2017-04-07 NOTE — Progress Notes (Signed)
Subjective:    Patient ID: Kathy Mckay, female    DOB: 06-Apr-1932, 84 y.o.   MRN: [id number]  CC: Kathy Mckay is a 84 y.o. female who presents today for follow up.   HPI: Overall feels well. No falls. Uses walker at home. Lives in a  House with 4 BR, she has her own single room. Care is around the clock.  Accompanied by caregiver. Request oral exam as per state recommendation. No oral complaints today.   Caregiver notes she in bathroom a lot, may be behavioral and not actually using the restroom.  Per patient, no dysuria, low back pain, hematuria. Reports regular BMs   HTN- on norvasc. Not checking BP at the home.   Depression -doing well on remeron, no si/hi  No complaints with skin.  Notes birthmark on right ventral aspect of hand. Mom called it 'her mark' when she was born per patient. Unchanged. Non bleeding. Doesn't itch.  No wounds.          HISTORY:  Past Medical History:  Diagnosis Date  . Developmental delay   . GERD (gastroesophageal reflux disease)   . Hyperlipidemia   . Hypertension   . Osteoporosis    Past Surgical History:  Procedure Laterality Date  . ABDOMINAL HYSTERECTOMY    . SHOULDER SURGERY     Family History  Problem Relation Age of Onset  . Diabetes Mother   . Diabetes Father     Allergies: Patient has no known allergies. Current Outpatient Medications on File Prior to Visit  Medication Sig Dispense Refill  . amLODipine (NORVASC) 10 MG tablet TAKE 1 TABLET BY MOUTH DAILY 90 tablet 1  . atorvastatin (LIPITOR) 40 MG tablet TAKE 1 TABLET BY MOUTH ONCE DAILY 90 tablet 1  . esomeprazole (NEXIUM) 40 MG capsule TAKE 1 CAPSULE BY MOUTH DAILY IN THE MORNING, 8am. 90 capsule 1  . mirtazapine (REMERON) 45 MG tablet Takes 1 tablet at bedtime 30 tablet 6   No current facility-administered medications on file prior to visit.     Social History   Tobacco Use  . Smoking status: Never Smoker  . Smokeless tobacco: Never Used  Substance Use  Topics  . Alcohol use: No  . Drug use: No    Review of Systems  Constitutional: Negative for chills and fever.  Respiratory: Negative for cough.   Cardiovascular: Negative for chest pain and palpitations.  Gastrointestinal: Negative for abdominal pain, constipation, nausea and vomiting.  Genitourinary: Negative for dysuria, frequency and hematuria.  Skin: Negative for color change and wound.      Objective:    BP (!) 150/90 (BP Location: Left Arm, Patient Position: Sitting, Cuff Size: Normal)   Pulse 93   Temp 97.8 F (36.6 C) (Oral)   Wt 96 lb (43.5 kg)   SpO2 93%   BMI 20.06 kg/m  BP Readings from Last 3 Encounters:  04/07/17 (!) 150/90  08/03/16 118/78  04/27/16 140/82   Wt Readings from Last 3 Encounters:  04/07/17 96 lb (43.5 kg)  08/03/16 96 lb 8 oz (43.8 kg)  04/27/16 98 lb 3.2 oz (44.5 kg)    Physical Exam  Constitutional: She appears well-developed and well-nourished.  HENT:  Mouth/Throat: She does not have dentures. No oral lesions. No dental abscesses.  No teeth. Gums pink, moist.   Cardiovascular: Normal rate, regular rhythm, normal heart sounds and normal pulses.  Pulmonary/Chest: Effort normal and breath sounds normal. She has no wheezes. She has no rhonchi. She  has no rales.  Abdominal: There is no CVA tenderness.  No suprapubic pain with palpation.   Neurological: She is alert.  Skin: Skin is warm and dry.     darkened area of skin right ventral aspect of palm.   Psychiatric: She has a normal mood and affect. Her speech is normal and behavior is normal. Thought content normal.  Vitals reviewed.      Assessment & Plan:   Problem List Items Addressed This Visit      Cardiovascular and Mediastinum   Hypertension - Primary    Slightly elevated today. Perhaps anxiety contributing. Spoke with patient and caregiver how they are not checking blood pressure at the home.  Advised him to at least check once weekly and if we continue to see elevated  numbers, we need to change her blood pressure regimen.  Of note, advised tdap and shingrex series.       Relevant Orders   Comp Met (CMET)     Endocrine   Hypothyroidism    Pending tsh as hasnt been checked in some time.       Relevant Orders   TSH     Musculoskeletal and Integument   Osteoporosis    Due for dexa, ordered.       Relevant Orders   DG Bone Density   Vitamin D (25 hydroxy)     Other   Pernicious anemia   Relevant Orders   CBC w/Diff   Hemoglobin A1c   Screening for breast cancer    Ordered. We scheduled for patient.       Relevant Orders   MM SCREENING BREAST TOMO BILATERAL    Other Visit Diagnoses    Hyperlipidemia, unspecified hyperlipidemia type       Relevant Orders   Lipid panel       I am having [name]. Mau maintain her mirtazapine, esomeprazole, amLODipine, and atorvastatin.   No orders of the defined types were placed in this encounter.   Return precautions given.   Risks, benefits, and alternatives of the medications and treatment plan prescribed today were discussed, and patient expressed understanding.   Education regarding symptom management and diagnosis given to patient on AVS.  Continue to follow with Burnard Hawthorne, FNP for routine health maintenance.   [name] and I agreed with plan.   Mable Paris, FNP

## 2017-04-07 NOTE — Assessment & Plan Note (Signed)
Due for dexa, ordered.

## 2017-04-07 NOTE — Assessment & Plan Note (Addendum)
Slightly elevated today. Perhaps anxiety contributing. Spoke with patient and caregiver how they are not checking blood pressure at the home.  Advised him to at least check once weekly and if we continue to see elevated numbers, we need to change her blood pressure regimen.  Of note, advised tdap and shingrex series.

## 2017-04-07 NOTE — Assessment & Plan Note (Signed)
Pending tsh as hasnt been checked in some time.

## 2017-04-07 NOTE — Patient Instructions (Addendum)
Check blood pressure weekly.   Low salt.  She is due for tetanus vaccine. I would also advise shingrex vaccine.   Goal is less than 130/80; if persistently higher, please make sooner follow up appointment so we can recheck you blood pressure and manage medications  She needs mammogram and bone density test. Please call the office if not scheduled and you do not hear from [place].       DASH Eating Plan DASH stands for "Dietary Approaches to Stop Hypertension." The DASH eating plan is a healthy eating plan that has been shown to reduce high blood pressure (hypertension). It may also reduce your risk for type 2 diabetes, heart disease, and stroke. The DASH eating plan may also help with weight loss. What are tips for following this plan? General guidelines  Avoid eating more than 2,300 mg (milligrams) of salt (sodium) a day. If you have hypertension, you may need to reduce your sodium intake to 1,500 mg a day.  Limit alcohol intake to no more than 1 drink a day for nonpregnant women and 2 drinks a day for men. One drink equals 12 oz of beer, 5 oz of wine, or 1 oz of hard liquor.  Work with your health care provider to maintain a healthy body weight or to lose weight. Ask what an ideal weight is for you.  Get at least 30 minutes of exercise that causes your heart to beat faster (aerobic exercise) most days of the week. Activities may include walking, swimming, or biking.  Work with your health care provider or diet and nutrition specialist (dietitian) to adjust your eating plan to your individual calorie needs. Reading food labels  Check food labels for the amount of sodium per serving. Choose foods with less than 5 percent of the Daily Value of sodium. Generally, foods with less than 300 mg of sodium per serving fit into this eating plan.  To find whole grains, look for the word "whole" as the first word in the ingredient list. Shopping  Buy products labeled as "low-sodium" or "no salt  added."  Buy fresh foods. Avoid canned foods and premade or frozen meals. Cooking  Avoid adding salt when cooking. Use salt-free seasonings or herbs instead of table salt or sea salt. Check with your health care provider or pharmacist before using salt substitutes.  Do not fry foods. Cook foods using healthy methods such as baking, boiling, grilling, and broiling instead.  Cook with heart-healthy oils, such as olive, canola, soybean, or sunflower oil. Meal planning   Eat a balanced diet that includes: ? 5 or more servings of fruits and vegetables each day. At each meal, try to fill half of your plate with fruits and vegetables. ? Up to 6-8 servings of whole grains each day. ? Less than 6 oz of lean meat, poultry, or fish each day. A 3-oz serving of meat is about the same size as a deck of cards. One egg equals 1 oz. ? 2 servings of low-fat dairy each day. ? A serving of nuts, seeds, or beans 5 times each week. ? Heart-healthy fats. Healthy fats called Omega-3 fatty acids are found in foods such as flaxseeds and coldwater fish, like sardines, salmon, and mackerel.  Limit how much you eat of the following: ? Canned or prepackaged foods. ? Food that is high in trans fat, such as fried foods. ? Food that is high in saturated fat, such as fatty meat. ? Sweets, desserts, sugary drinks, and other foods with  added sugar. ? Full-fat dairy products.  Do not salt foods before eating.  Try to eat at least 2 vegetarian meals each week.  Eat more home-cooked food and less restaurant, buffet, and fast food.  When eating at a restaurant, ask that your food be prepared with less salt or no salt, if possible. What foods are recommended? The items listed may not be a complete list. Talk with your dietitian about what dietary choices are best for you. Grains Whole-grain or whole-wheat bread. Whole-grain or whole-wheat pasta. Brown rice. [place]. Bulgur. Whole-grain and low-sodium cereals.  Pita bread. Low-fat, low-sodium crackers. Whole-wheat flour tortillas. Vegetables Fresh or frozen vegetables (raw, steamed, roasted, or grilled). Low-sodium or reduced-sodium tomato and vegetable juice. Low-sodium or reduced-sodium tomato sauce and tomato paste. Low-sodium or reduced-sodium canned vegetables. Fruits All fresh, dried, or frozen fruit. Canned fruit in natural juice (without added sugar). Meat and other protein foods Skinless chicken or [place]. Ground chicken or [place]. Pork with fat trimmed off. Fish and seafood. Egg whites. Dried beans, peas, or lentils. Unsalted nuts, nut butters, and seeds. Unsalted canned beans. Lean cuts of beef with fat trimmed off. Low-sodium, lean deli meat. Dairy Low-fat (1%) or fat-free (skim) milk. Fat-free, low-fat, or reduced-fat cheeses. Nonfat, low-sodium ricotta or cottage cheese. Low-fat or nonfat yogurt. Low-fat, low-sodium cheese. Fats and oils Soft margarine without trans fats. Vegetable oil. Low-fat, reduced-fat, or light mayonnaise and salad dressings (reduced-sodium). Canola, safflower, olive, soybean, and sunflower oils. Avocado. Seasoning and other foods Herbs. Spices. Seasoning mixes without salt. Unsalted popcorn and pretzels. Fat-free sweets. What foods are not recommended? The items listed may not be a complete list. Talk with your dietitian about what dietary choices are best for you. Grains Baked goods made with fat, such as croissants, muffins, or some breads. Dry pasta or rice meal packs. Vegetables Creamed or fried vegetables. Vegetables in a cheese sauce. Regular canned vegetables (not low-sodium or reduced-sodium). Regular canned tomato sauce and paste (not low-sodium or reduced-sodium). Regular tomato and vegetable juice (not low-sodium or reduced-sodium). Angie Fava. Olives. Fruits Canned fruit in a light or heavy syrup. Fried fruit. Fruit in cream or butter sauce. Meat and other protein foods Fatty cuts of meat. Ribs. Fried  meat. Berniece Salines. Sausage. Bologna and other processed lunch meats. Salami. Fatback. Hotdogs. Bratwurst. Salted nuts and seeds. Canned beans with added salt. Canned or smoked fish. Whole eggs or egg yolks. Chicken or [place] with skin. Dairy Whole or 2% milk, cream, and half-and-half. Whole or full-fat cream cheese. Whole-fat or sweetened yogurt. Full-fat cheese. Nondairy creamers. Whipped toppings. Processed cheese and cheese spreads. Fats and oils Butter. Stick margarine. Lard. Shortening. Ghee. Bacon fat. Tropical oils, such as coconut, palm kernel, or palm oil. Seasoning and other foods Salted popcorn and pretzels. Onion salt, garlic salt, seasoned salt, table salt, and sea salt. Worcestershire sauce. Tartar sauce. Barbecue sauce. Teriyaki sauce. Soy sauce, including reduced-sodium. Steak sauce. Canned and packaged gravies. Fish sauce. Oyster sauce. Cocktail sauce. Horseradish that you find on the shelf. Ketchup. Mustard. Meat flavorings and tenderizers. Bouillon cubes. Hot sauce and Tabasco sauce. Premade or packaged marinades. Premade or packaged taco seasonings. Relishes. Regular salad dressings. Where to find more information:  National Heart, Lung, and [place]: https://wilson-eaton.com/  American Heart Association: www.heart.org Summary  The DASH eating plan is a healthy eating plan that has been shown to reduce high blood pressure (hypertension). It may also reduce your risk for type 2 diabetes, heart disease, and stroke.  With the DASH  eating plan, you should limit salt (sodium) intake to 2,300 mg a day. If you have hypertension, you may need to reduce your sodium intake to 1,500 mg a day.  When on the DASH eating plan, aim to eat more fresh fruits and vegetables, whole grains, lean proteins, low-fat dairy, and heart-healthy fats.  Work with your health care provider or diet and nutrition specialist (dietitian) to adjust your eating plan to your individual calorie needs. This information is  not intended to replace advice given to you by your health care provider. Make sure you discuss any questions you have with your health care provider. Document Released: 12/16/2010 Document Revised: 12/21/2015 Document Reviewed: 12/21/2015 Elsevier Interactive Patient Education  [employer].

## 2017-06-22 ENCOUNTER — Other Ambulatory Visit: Payer: Self-pay | Admitting: Family

## 2017-06-22 ENCOUNTER — Ambulatory Visit
Admission: RE | Admit: 2017-06-22 | Discharge: 2017-06-22 | Disposition: A | Discharge status: Home / Self Care | Payer: Medicare Other | Source: Ambulatory Visit | Attending: Family | Admitting: Family

## 2017-06-22 DIAGNOSIS — Z1231 Encounter for screening mammogram for malignant neoplasm of breast: Secondary | ICD-10-CM | POA: Insufficient documentation

## 2017-06-22 DIAGNOSIS — N6320 Unspecified lump in the left breast, unspecified quadrant: Secondary | ICD-10-CM

## 2017-06-22 DIAGNOSIS — R928 Other abnormal and inconclusive findings on diagnostic imaging of breast: Secondary | ICD-10-CM

## 2017-06-22 DIAGNOSIS — R921 Mammographic calcification found on diagnostic imaging of breast: Secondary | ICD-10-CM

## 2017-06-22 DIAGNOSIS — Z1239 Encounter for other screening for malignant neoplasm of breast: Secondary | ICD-10-CM

## 2017-07-05 ENCOUNTER — Ambulatory Visit
Admission: RE | Admit: 2017-07-05 | Discharge: 2017-07-05 | Disposition: A | Discharge status: Home / Self Care | Payer: Medicare Other | Source: Ambulatory Visit | Attending: Family | Admitting: Family

## 2017-07-05 DIAGNOSIS — N6320 Unspecified lump in the left breast, unspecified quadrant: Secondary | ICD-10-CM | POA: Insufficient documentation

## 2017-07-05 DIAGNOSIS — R921 Mammographic calcification found on diagnostic imaging of breast: Secondary | ICD-10-CM | POA: Diagnosis not present

## 2017-07-05 DIAGNOSIS — R928 Other abnormal and inconclusive findings on diagnostic imaging of breast: Secondary | ICD-10-CM

## 2017-07-06 ENCOUNTER — Other Ambulatory Visit: Payer: Self-pay | Admitting: Family

## 2017-07-06 DIAGNOSIS — N6320 Unspecified lump in the left breast, unspecified quadrant: Secondary | ICD-10-CM

## 2017-07-06 DIAGNOSIS — R928 Other abnormal and inconclusive findings on diagnostic imaging of breast: Secondary | ICD-10-CM

## 2017-07-12 ENCOUNTER — Ambulatory Visit
Admission: RE | Admit: 2017-07-12 | Discharge: 2017-07-12 | Disposition: A | Discharge status: Home / Self Care | Payer: Medicare Other | Source: Ambulatory Visit | Attending: Family | Admitting: Family

## 2017-07-12 DIAGNOSIS — R921 Mammographic calcification found on diagnostic imaging of breast: Secondary | ICD-10-CM | POA: Diagnosis not present

## 2017-07-12 DIAGNOSIS — N6320 Unspecified lump in the left breast, unspecified quadrant: Secondary | ICD-10-CM

## 2017-07-12 DIAGNOSIS — R928 Other abnormal and inconclusive findings on diagnostic imaging of breast: Secondary | ICD-10-CM

## 2017-07-12 DIAGNOSIS — C50812 Malignant neoplasm of overlapping sites of left female breast: Secondary | ICD-10-CM | POA: Diagnosis not present

## 2017-07-12 DIAGNOSIS — N6342 Unspecified lump in left breast, subareolar: Secondary | ICD-10-CM | POA: Diagnosis not present

## 2017-07-12 DIAGNOSIS — R590 Localized enlarged lymph nodes: Secondary | ICD-10-CM | POA: Diagnosis not present

## 2017-07-14 ENCOUNTER — Telehealth: Payer: Self-pay

## 2017-07-14 ENCOUNTER — Other Ambulatory Visit: Payer: Self-pay | Admitting: Family

## 2017-07-14 DIAGNOSIS — C50912 Malignant neoplasm of unspecified site of left female breast: Secondary | ICD-10-CM

## 2017-07-14 NOTE — Telephone Encounter (Signed)
[facility] Kennedy Bucker [phone] Patient has positive breast biopsy see results in Epic  Radiologist will inform patient of results   Referral has been placed [place] wants [place] to call back and let her know if you placed referral for clarification. Thanks

## 2017-07-15 ENCOUNTER — Other Ambulatory Visit: Payer: Self-pay | Admitting: Family

## 2017-07-15 DIAGNOSIS — K219 Gastro-esophageal reflux disease without esophagitis: Secondary | ICD-10-CM

## 2017-07-17 NOTE — Telephone Encounter (Signed)
Call pt Please clarify- patient has an appt with [place] [facility]  I have also placed referral to oncology  Please ask Joann to call us this week if she hasnt heard from [place] with an appointment.    Melissa,  How soon can we get her in with Onc?

## 2017-07-17 NOTE — Telephone Encounter (Signed)
Per [facility] radiologist has informed patient of bx results. Arville Go states she thought our office had placed referral on 07/14/17 I explained that Joycelyn Schmid was out of office on 07/14/17.  Joann she states Runner, broadcasting/film/video at [employer] reach out to patient to see if patient has seen Psychologist, sport and exercise before.  I explained to Joann that we haven't heard from [employer] . Per Joycelyn Schmid she wants  to make sure that referral  gets done.   She will let our referral coordinator take care of referral to general surgery since we haven't heard from RN navigator and are unsure if patient has appointment.

## 2017-07-17 NOTE — Telephone Encounter (Signed)
I called and she is not scheduled with general surgery as you mentioned in the referral. I will send this to [facility] today so she will get scheduled. I have also sent this to [facility]. They are typically good at getting patients in quick.

## 2017-07-18 ENCOUNTER — Telehealth: Payer: Self-pay | Admitting: Family

## 2017-07-18 LAB — SURGICAL PATHOLOGY

## 2017-07-18 NOTE — Telephone Encounter (Signed)
Any update on appt with surgery or onc appts?  Thanks !

## 2017-07-18 NOTE — Telephone Encounter (Unsigned)
Copied from [place] [phone]. Topic: Quick Communication - See Telephone Encounter >> Jul 18, 2017  2:04 PM Percell Belt A wrote: CRM for notification. See Telephone encounter for: 07/18/17.    Sarah from [facility] called and that they called the pt social worker that works with his pt and she has resigned.  They are unsure who will be pt new social worker and who to contact to get this sch'd?  They told sarah to contact someone by name of Mina Marble ??  Any idea who this might be?     Best number -[phone]

## 2017-07-18 NOTE — Telephone Encounter (Signed)
Please advise 

## 2017-07-19 HISTORY — PX: BREAST BIOPSY: SHX20

## 2017-07-19 NOTE — Telephone Encounter (Signed)
She is scheduled with Dr. [name] on 7/19 at [facility]. Her [facility] referral is still in review.Marland KitchenMarland KitchenI will call them to follow up on it since it was sent as urgent.

## 2017-07-19 NOTE — Telephone Encounter (Signed)
Left message to return call for Kathy Mckay

## 2017-07-20 ENCOUNTER — Other Ambulatory Visit: Payer: Self-pay | Admitting: *Deleted

## 2017-07-21 NOTE — Telephone Encounter (Signed)
Any word on surgery appt?

## 2017-07-24 ENCOUNTER — Telehealth: Payer: Self-pay

## 2017-07-24 NOTE — Telephone Encounter (Signed)
Copied from [employer] #[id number]. Topic: Inquiry >> Jul 24, 2017 10:06 AM Conception Chancy, NT wrote: Reason for CRM: [name] is calling and states patient is in her family care home and she would like to leave the name of the patient social worker. She states Lasha Hickman is her Education officer, museum. She can be reached at [phone] or  [phone].

## 2017-07-24 NOTE — Telephone Encounter (Signed)
Spoke to Journalist, newspaper at [employer]. She states that she had called on 7/9 to get her scheduled with them since it is an urgent appointment. She states that she spoke to [name] and was told that her social worker has resigned and has not been issued a new one. She wanted Judson Roch to call [facility] to see who it was going to be.  I spoke to [facility] this morning to find out if she has been issued a new Education officer, museum yet because she [place] [facility] d/t her diagnosis. She states that she has called numerous times and spoke to the supervisor last week to get something in place but she has not heard anything regarding it yet. I emphasized that it needs to be taking care of asap and to continue to get a hold of someone to get this taken care of.

## 2017-07-24 NOTE — Telephone Encounter (Signed)
Please contact the social work office and see what needs to be done for this patient. Does she have a power of attorney? Please try to find out who Kathy Mckay is and see if this is someone that we can speak to. This appointment needs to be scheduled as she has breast cancer.

## 2017-07-24 NOTE — Telephone Encounter (Signed)
Left voicemail to call for [facility] at care home.  They need to call [facility] and speak with Clarise Cruz at [phone]

## 2017-07-25 NOTE — Telephone Encounter (Signed)
Called Lasha , [facility] Social Worker back and she inquired on procedure that patient was going to be scheduled for with [employer]. I explained to her that I was unsure that she would have to have a consultation first and then would determine what procedure.    Left message on voicemail for [name] at [place] to let her know that social worker is in contact with [facility] regarding setting up appointment.

## 2017-07-25 NOTE — Telephone Encounter (Signed)
Copied from [place] #[id number]. Topic: Inquiry >> Jul 25, 2017  8:44 AM Vernona Rieger wrote: Social Worker with [facility] would like a call back from [place] in regards to the treatment she will be getting. Call back @ [phone]

## 2017-07-25 NOTE — Telephone Encounter (Signed)
Spoke with Kingsley Spittle at [place] , this is patients Education officer, museum who serves as guardian for patient.  Clarified that [employer] is Archivist.  Explained to her that patient has breast cancer and she needs to call [facility] and speak with Judson Roch in regards to setting up appointment ASAP .  Gave her Lucita Ferrara number at [facility] and she will call me back letting me know when patients appointment is.

## 2017-07-25 NOTE — Telephone Encounter (Signed)
[name] at [place] advised appointment with [facility] was in the process of being scheduled by [facility] Social Worker .

## 2017-07-27 NOTE — Telephone Encounter (Signed)
Please call the surgeon's office or the patient's guardian to see if this appointment has been scheduled. Thanks.

## 2017-07-28 ENCOUNTER — Inpatient Hospital Stay: Payer: Medicare Other | Attending: Oncology | Admitting: Oncology

## 2017-07-28 ENCOUNTER — Encounter: Payer: Self-pay | Admitting: Oncology

## 2017-07-28 ENCOUNTER — Inpatient Hospital Stay: Payer: Medicare Other

## 2017-07-28 VITALS — BP 169/82 | HR 116 | Temp 96.6°F | Resp 18 | Ht <= 58 in | Wt 97.1 lb

## 2017-07-28 DIAGNOSIS — C50112 Malignant neoplasm of central portion of left female breast: Secondary | ICD-10-CM | POA: Insufficient documentation

## 2017-07-28 DIAGNOSIS — Z79899 Other long term (current) drug therapy: Secondary | ICD-10-CM | POA: Insufficient documentation

## 2017-07-28 DIAGNOSIS — F89 Unspecified disorder of psychological development: Secondary | ICD-10-CM | POA: Diagnosis not present

## 2017-07-28 DIAGNOSIS — I10 Essential (primary) hypertension: Secondary | ICD-10-CM | POA: Insufficient documentation

## 2017-07-28 DIAGNOSIS — M810 Age-related osteoporosis without current pathological fracture: Secondary | ICD-10-CM | POA: Diagnosis not present

## 2017-07-28 DIAGNOSIS — C50912 Malignant neoplasm of unspecified site of left female breast: Secondary | ICD-10-CM

## 2017-07-28 DIAGNOSIS — Z170 Estrogen receptor positive status [ER+]: Secondary | ICD-10-CM | POA: Diagnosis not present

## 2017-07-28 DIAGNOSIS — Z7189 Other specified counseling: Secondary | ICD-10-CM

## 2017-07-28 LAB — COMPREHENSIVE METABOLIC PANEL
ALT: 42 U/L (ref 0–44)
ANION GAP: 15 (ref 5–15)
AST: 47 U/L — ABNORMAL HIGH (ref 15–41)
Albumin: 4.6 g/dL (ref 3.5–5.0)
Alkaline Phosphatase: 131 U/L — ABNORMAL HIGH (ref 38–126)
BUN: 22 mg/dL (ref 8–23)
CHLORIDE: 104 mmol/L (ref 98–111)
CO2: 18 mmol/L — ABNORMAL LOW (ref 22–32)
Calcium: 9.6 mg/dL (ref 8.9–10.3)
Creatinine, Ser: 0.95 mg/dL (ref 0.44–1.00)
GFR, EST NON AFRICAN AMERICAN: 54 mL/min — AB (ref >60)
Glucose, Bld: 157 mg/dL — ABNORMAL HIGH (ref 70–99)
POTASSIUM: 4.1 mmol/L (ref 3.5–5.1)
Sodium: 137 mmol/L (ref 135–145)
TOTAL PROTEIN: 8.4 g/dL — AB (ref 6.5–8.1)
Total Bilirubin: 1.1 mg/dL (ref 0.3–1.2)

## 2017-07-28 LAB — CBC WITH DIFFERENTIAL/PLATELET
BASOS ABS: 0 10*3/uL (ref 0–0.1)
Basophils Relative: 0 %
Eosinophils Absolute: 0 10*3/uL (ref 0–0.7)
Eosinophils Relative: 0 %
HCT: 48.7 % — ABNORMAL HIGH (ref 35.0–47.0)
HEMOGLOBIN: 16.3 g/dL — AB (ref 12.0–16.0)
LYMPHS ABS: 0.5 10*3/uL — AB (ref 1.0–3.6)
LYMPHS PCT: 6 %
MCH: 33.1 pg (ref 26.0–34.0)
MCHC: 33.5 g/dL (ref 32.0–36.0)
MCV: 98.7 fL (ref 80.0–100.0)
Monocytes Absolute: 0.5 10*3/uL (ref 0.2–0.9)
Monocytes Relative: 6 %
NEUTROS PCT: 88 %
Neutro Abs: 8 10*3/uL — ABNORMAL HIGH (ref 1.4–6.5)
Platelets: 269 10*3/uL (ref 150–440)
RBC: 4.93 MIL/uL (ref 3.80–5.20)
RDW: 13.8 % (ref 11.5–14.5)
WBC: 9.1 10*3/uL (ref 3.6–11.0)

## 2017-07-28 MED ORDER — TAMOXIFEN CITRATE 20 MG PO TABS: 20.0000 mg | ORAL_TABLET | Freq: Every day | ORAL | 3 refills | Status: DC

## 2017-07-28 NOTE — Progress Notes (Signed)
Patient here today as new evaluation regarding left breast cancer.  Referred by Dr. Vidal Schwalbe.  Patient is accompanied by caregiver, [name], owner of [employer] [facility].

## 2017-07-28 NOTE — Telephone Encounter (Signed)
Patient has been scheduled for 09/03/17 at [facility].

## 2017-07-28 NOTE — Progress Notes (Signed)
  Oncology Nurse Navigator Documentation  Navigator Location: [facility] (07/28/17 1300) Referral date to RadOnc/MedOnc: 07/28/17 (07/28/17 1300) )Navigator Encounter Type: Initial MedOnc (07/28/17 1300)   Abnormal Finding Date: 07/05/17 (07/28/17 1300) Confirmed Diagnosis Date: 07/12/17 (07/28/17 1300)               Patient Visit Type: Initial (07/28/17 1300)   Barriers/Navigation Needs: Coordination of Care;Education (07/28/17 1300) Education: Understanding Cancer/ Treatment Options (07/28/17 1300) Interventions: Education;Coordination of Care (07/28/17 1300)                      Time Spent with Patient: 60 (07/28/17 1300)   Met patient and caregiver [name].  Patient lives at [place] [facility].  She is a ward of [place], and her Education officer, museum is Training and development officer.  Dr. [name] reviewed plan for treatment, dependent on upcoming CT, and bone scan results.  Patient will begin Tamoxifen.  Prescription sent to her pharmacy.

## 2017-07-28 NOTE — Progress Notes (Signed)
Hematology/Oncology Consult note [facility] Telephone:([phone] [phone]  Patient Care Team: Burnard Hawthorne, FNP as PCP - General (Family Medicine)   Name of the patient: Kathy Mckay  [id number]  [birth year]    Reason for referral- newly diagnosed breast cancer   Referring physician- Mable Paris FNP  Date of visit: 07/28/17   History of presenting illness-patient is a 84 year old female with developmental disorder.  She is a ward of the state and lives in a group home.  She is here with her caregiver Ms. Tyree today.  She has been at the group home for the last 4 years.  She does not have any living family other than a distant cousin.  Her assigned Education officer, museum Ms. Dayton Scrape is her healthcare proxy. Her past medical history is significant for hypertension hyperlipidemia and osteoporosis among other medical problems.  Her caregiver states that overall this patient is a happy-go-lucky person and has been doing well at her nursing home.  She has not had any recent falls or hospitalizations.  She recently underwent a screening mammogram which picked up a mass in her left breast.  Patient has not had a prior mammogram before this for years.  Mammogram and ultrasound showed 2.9 x 2.8 x 3 cm mass in the left retroareolar region.  There is extension into the skin and nipple areole or complex.  Associated skin thickening.  At least 2 abnormal appearing left axillary lymph nodes with cortical thickening up to 1 cm.  Core biopsy of the breast mass and the axillary lymph node revealed invasive mammary carcinoma, grade 2 ER Greater than 90% and PR 11-50% positive and HER-2/neu negative.  Extracapsular extension was noted in the lymph node biopsy.  Patient herself is unable to understand her ongoing medical issues she denies any complaints today.  Caregiver reports that her appetite is good.  And she has not had any unintentional weight loss  ECOG PS-  2  Pain scale- 0   Review of systems-limited due to developmental delay   Review of Systems  Constitutional: Negative for chills, fever, malaise/fatigue and weight loss.  HENT: Negative for congestion, ear discharge and nosebleeds.   Eyes: Negative for blurred vision.  Respiratory: Negative for cough, hemoptysis, sputum production, shortness of breath and wheezing.   Cardiovascular: Negative for chest pain, palpitations, orthopnea and claudication.  Gastrointestinal: Negative for abdominal pain, blood in stool, constipation, diarrhea, heartburn, melena, nausea and vomiting.  Genitourinary: Negative for dysuria, flank pain, frequency, hematuria and urgency.  Musculoskeletal: Negative for back pain, joint pain and myalgias.  Skin: Negative for rash.  Neurological: Negative for dizziness, tingling, focal weakness, seizures, weakness and headaches.  Endo/Heme/Allergies: Does not bruise/bleed easily.  Psychiatric/Behavioral: Negative for depression and suicidal ideas. The patient does not have insomnia.     No Known Allergies  Patient Active Problem List   Diagnosis Date Noted  . Malignant neoplasm of left breast in female, estrogen receptor positive ([place]) 07/28/2017  . Dysuria 08/03/2016  . Tachycardia 08/03/2016  . Elevated alkaline phosphatase level 04/27/2016  . Gastroesophageal reflux disease 04/27/2016  . Screening for breast cancer 10/28/2015  . Medicare annual wellness visit, subsequent 03/05/2014  . Other and unspecified hyperlipidemia 11/19/2012  . Urinary incontinence 03/27/2012  . Osteoarthritis 12/26/2011  . Osteoporosis 03/15/2011  . Depression 03/01/2011  . Pernicious anemia 10/20/2010  . Hypertension 10/20/2010  . Hypothyroidism 10/20/2010  . Gait disturbance 10/20/2010  . Developmental delay 10/20/2010     Past Medical History:  Diagnosis Date  . Developmental delay   . GERD (gastroesophageal reflux disease)   . Hyperlipidemia   . Hypertension   .  Osteoporosis      Past Surgical History:  Procedure Laterality Date  . ABDOMINAL HYSTERECTOMY    . SHOULDER SURGERY      Social History   Socioeconomic History  . Marital status: Single    Spouse name: Not on file  . Number of children: Not on file  . Years of education: Not on file  . Highest education level: Not on file  Occupational History  . Not on file  Social Needs  . Financial resource strain: Not on file  . Food insecurity:    Worry: Not on file    Inability: Not on file  . Transportation needs:    Medical: Not on file    Non-medical: Not on file  Tobacco Use  . Smoking status: Never Smoker  . Smokeless tobacco: Never Used  Substance and Sexual Activity  . Alcohol use: No  . Drug use: No  . Sexual activity: Never  Lifestyle  . Physical activity:    Days per week: Not on file    Minutes per session: Not on file  . Stress: Not on file  Relationships  . Social connections:    Talks on phone: Not on file    Gets together: Not on file    Attends religious service: Not on file    Active member of club or organization: Not on file    Attends meetings of clubs or organizations: Not on file    Relationship status: Not on file  . Intimate partner violence:    Fear of current or ex partner: Not on file    Emotionally abused: Not on file    Physically abused: Not on file    Forced sexual activity: Not on file  Other Topics Concern  . Not on file  Social History Narrative   Lives at a family care home. No living relatives. Guardian of the state.      Lives [place] [facility] [place] for past 6 years.      Family History  Problem Relation Age of Onset  . Diabetes Mother   . Diabetes Father   . Cancer Sister      Current Outpatient Medications:  .  amLODipine (NORVASC) 10 MG tablet, TAKE 1 TABLET BY MOUTH DAILY, Disp: 90 tablet, Rfl: 1 .  atorvastatin (LIPITOR) 40 MG tablet, TAKE 1 TABLET BY MOUTH ONCE DAILY, Disp: 90 tablet, Rfl: 1 .  esomeprazole (NEXIUM) 40  MG capsule, TAKE 1 CAPSULE BY MOUTH ONCE DAILY AT 8 AM, Disp: 90 capsule, Rfl: 0 .  mirtazapine (REMERON) 45 MG tablet, TAKE ONE TABLET BY MOUTH AT BEDTIME., Disp: 30 tablet, Rfl: 1 .  tamoxifen (NOLVADEX) 20 MG tablet, Take 1 tablet (20 mg total) by mouth daily., Disp: 30 tablet, Rfl: 3   Physical exam:  Vitals:   07/28/17 1121  BP: (!) 169/82  Pulse: (!) 116  Resp: 18  Temp: (!) 96.6 F (35.9 C)  TempSrc: Tympanic  Weight: 97 lb 2 oz (44.1 kg)  Height: _0  (1.473 m)   Physical Exam  Constitutional: She is oriented to person, place, and time.  Thin frail elderly lady who appears in no acute distress.  She ambulates with a walker.  She has kyphosis  HENT:  Head: Normocephalic and atraumatic.  Eyes: Pupils are equal, round, and reactive to light. EOM are normal.  Neck: Normal range of motion.  Cardiovascular: Normal rate, regular rhythm and normal heart sounds.  Pulmonary/Chest: Effort normal and breath sounds normal.  Abdominal: Soft. Bowel sounds are normal.  Neurological: She is alert and oriented to person, place, and time.  Skin: Skin is warm and dry.  Palpable breast masses noted around the areola and the nipple associated with skin induration and satellite nodules noted on the skin without frank ulceration.  I can palpate one solitary lymph node in the left axilla    CMP Latest Ref Rng & Units 07/28/2017  Glucose 70 - 99 mg/dL 157(H)  BUN 8 - 23 mg/dL 22  Creatinine 0.44 - 1.00 mg/dL 0.95  Sodium 135 - 145 mmol/L 137  Potassium 3.5 - 5.1 mmol/L 4.1  Chloride 98 - 111 mmol/L 104  CO2 22 - 32 mmol/L 18(L)  Calcium 8.9 - 10.3 mg/dL 9.6  Total Protein 6.5 - 8.1 g/dL 8.4(H)  Total Bilirubin 0.3 - 1.2 mg/dL 1.1  Alkaline Phos 38 - 126 U/L 131(H)  AST 15 - 41 U/L 47(H)  ALT 0 - 44 U/L 42   CBC Latest Ref Rng & Units 07/28/2017  WBC 3.6 - 11.0 K/uL 9.1  Hemoglobin 12.0 - 16.0 g/dL 16.3(H)  Hematocrit 35.0 - 47.0 % 48.7(H)  Platelets 150 - 440 K/uL 269    No  images are attached to the encounter.  US Breast Ltd Uni Left Inc Axilla  Result Date: 07/05/2017 CLINICAL DATA:  Call-back from a screening mammogram for left breast mass and calcifications. EXAM: ULTRASOUND OF THE LEFT BREAST COMPARISON:  Previous exam(s). FINDINGS: On physical exam, large firm mass with associated skin discoloration. Targeted ultrasound is performed, showing malignant appearing irregular hypoechoic hypervascular mass in the left retroareolar region, centered at 3 o'clock, which measures 2.9 x 2.8 x 3.0 cm. There is extension to the skin in the nipple-areolar complex. There is an associated skin thickening. There are at least 2 abnormal appearing left axillary lymph nodes with cortical thickening of up to 1 cm. IMPRESSION: Suspicious left retroareolar breast mass with direct extension to the skin and nipple, measuring 3.0 cm in greatest dimension. Two left axillary lymph nodes, suspicious for metastatic disease. RECOMMENDATION: Ultrasound-guided core needle biopsy of left retroareolar breast mass, and 1 of the abnormal left axillary lymph nodes. I have discussed the findings and recommendations with the patient. Results were also provided in writing at the conclusion of the visit. If applicable, a reminder letter will be sent to the patient regarding the next appointment. BI-RADS CATEGORY  5: Highly suggestive of malignancy. Electronically Signed   By: Fidela Salisbury M.D.   On: 07/05/2017 16:23   Mm Clip Placement Left  Result Date: 07/12/2017 CLINICAL DATA:  Status post ultrasound-guided core biopsies of the left breast and left axilla. EXAM: DIAGNOSTIC LEFT MAMMOGRAM POST ULTRASOUND BIOPSIES COMPARISON:  Previous exam(s). FINDINGS: Mammographic images were obtained following ultrasound-guided core biopsies of a left breast mass and a left axillary lymph node. Mammographic images show there is a heart shaped clip in the mass in the retroareolar region of the left breast. Multiple  attempts at an axillary view were obtained. A single marker clip is noted in the left axilla. The second marker clip is likely not visualized secondary to its posterior location. IMPRESSION: Status post ultrasound-guided core biopsies of the left breast and left axilla with pathology pending. Final Assessment: Post Procedure Mammograms for Marker Placement Electronically Signed   By: Lillia Mountain M.D.   On: 07/12/2017  14:46   [place] Lt Breast Bx W Loc Dev 1st Lesion Img Bx Spec US Guide  Addendum Date: 07/19/2017   ADDENDUM REPORT: 07/18/2017 13:42 ADDENDUM: Pathology of the left breast biopsy revealed A. LEFT BREAST, 3:00 RETROAREOLAR; ULTRASOUND-GUIDED BIOPSY: INVASIVE MAMMARY CARCINOMA, NO SPECIAL TYPE. Size of invasive carcinoma: 17 mm in this sample. Histologic grade of invasive carcinoma: Grade 2. Ductal carcinoma in situ: Not identified. Lymphovascular invasion: Present. Pathology of the left axillary lymph node revealed B. LYMPH NODE, LEFT AXILLA; ULTRASOUND-GUIDED BIOPSY: METASTATIC CARCINOMA. EXTRACAPSULAR EXTENSION IS PRESENT. METASTATIC CARCINOMA MEASURES 6 MM IN THE SPECIMEN. ER/PR/HER2: Immunohistochemistry will be performed on block A1, with reflex to [place] for HER2 2+. The results will be reported in an addendum. Comment: The definitive grade will be assigned on the excisional specimen. These findings were communicated to [facility] in Dr. Clarise Cruz office on 07/14/2017. Read back procedure was performed. This was found to be concordant by Dr. Miquel Dunn. Recommendation: Surgical and oncology referrals. Results and recommendations were relayed to Cyril Mourning, nurse for Mable Paris, NP on 07/14/17. The provider is out of the office that afternoon. Cyril Mourning will ask about the referral on Monday and notify Jetta Lout, [place] if they will make the referral or request the nurse navigators to make the referral. At the patient's request, results were relayed to the patient's caregiver, [name] by phone by Dr. Miquel Dunn on  07/14/17. The patient did well following the biopsy. All of their questions were answered. They will be contacted regarding referral after the provider has reviewed the results. Cyril Mourning, nurse for Mable Paris, NP called on 07/17/17 and stated that their office will make the referral. Addendum by Jetta Lout, [place] on 07/18/17. Electronically Signed   By: Lillia Mountain M.D.   On: 07/18/2017 13:42   Result Date: 07/19/2017 CLINICAL DATA:  Suspicious left breast mass and axillary adenopathy. EXAM: ULTRASOUND GUIDED CORE BIOPSIES OF THE LEFT BREAST AND LEFT AXILLA. COMPARISON:  Previous exam(s). FINDINGS: I met with the patient and we discussed the procedure of ultrasound-guided biopsy, including benefits and alternatives. We discussed the high likelihood of a successful procedure. We discussed the risks of the procedure, including infection, bleeding, tissue injury, clip migration, and inadequate sampling. Informed written consent was given. The usual time-out protocol was performed immediately prior to the procedure. Lesion quadrant: 3 o'clock region of the left breast. Using sterile technique and 1% Lidocaine and 1% lidocaine with epinephrine as local anesthetic, under direct ultrasound visualization, a 14 gauge spring-loaded device was used to perform biopsy of a mass in the 3 o'clock subareolar region of the left breast using a lateral to medial approach. At the conclusion of the procedure a heart shaped tissue marker clip was deployed into the biopsy cavity. Follow up 2 view mammogram was performed and dictated separately. I met with the patient and we discussed the procedure of ultrasound-guided biopsy, including benefits and alternatives. We discussed the high likelihood of a successful procedure. We discussed the risks of the procedure, including infection, bleeding, tissue injury, clip migration, and inadequate sampling. Informed written consent was given. The usual time-out protocol was performed immediately  prior to the procedure. Lesion quadrant: Left axilla Using sterile technique and 1% Lidocaine and 1% lidocaine with epinephrine as local anesthetic, under direct ultrasound visualization, a 14 gauge spring-loaded device was used to perform biopsy of a left axillary lymph node using an inferior to superior approach. At the conclusion of the procedure a HydroMARK clip was placed in the left axilla but felt  to be adjacent to the lymph node and not in the lymph node and a second coil shaped clip was deployed into the biopsy cavity. Follow up 2 view mammogram was performed and dictated separately. IMPRESSION: Ultrasound guided biopsies of the left breast and left axilla. No apparent complications. Electronically Signed: By: Lillia Mountain M.D. On: 07/12/2017 14:43   [place] Lt Breast Bx W Loc Dev Ea Add Lesion Img Bx Spec US Guide  Addendum Date: 07/19/2017   ADDENDUM REPORT: 07/18/2017 13:42 ADDENDUM: Pathology of the left breast biopsy revealed A. LEFT BREAST, 3:00 RETROAREOLAR; ULTRASOUND-GUIDED BIOPSY: INVASIVE MAMMARY CARCINOMA, NO SPECIAL TYPE. Size of invasive carcinoma: 17 mm in this sample. Histologic grade of invasive carcinoma: Grade 2. Ductal carcinoma in situ: Not identified. Lymphovascular invasion: Present. Pathology of the left axillary lymph node revealed B. LYMPH NODE, LEFT AXILLA; ULTRASOUND-GUIDED BIOPSY: METASTATIC CARCINOMA. EXTRACAPSULAR EXTENSION IS PRESENT. METASTATIC CARCINOMA MEASURES 6 MM IN THE SPECIMEN. ER/PR/HER2: Immunohistochemistry will be performed on block A1, with reflex to [place] for HER2 2+. The results will be reported in an addendum. Comment: The definitive grade will be assigned on the excisional specimen. These findings were communicated to [facility] in Dr. Clarise Cruz office on 07/14/2017. Read back procedure was performed. This was found to be concordant by Dr. Miquel Dunn. Recommendation: Surgical and oncology referrals. Results and recommendations were relayed to Cyril Mourning, nurse for Mable Paris, NP on 07/14/17. The provider is out of the office that afternoon. Cyril Mourning will ask about the referral on Monday and notify Jetta Lout, [place] if they will make the referral or request the nurse navigators to make the referral. At the patient's request, results were relayed to the patient's caregiver, [name] by phone by Dr. Miquel Dunn on 07/14/17. The patient did well following the biopsy. All of their questions were answered. They will be contacted regarding referral after the provider has reviewed the results. Cyril Mourning, nurse for Mable Paris, NP called on 07/17/17 and stated that their office will make the referral. Addendum by Jetta Lout, [place] on 07/18/17. Electronically Signed   By: Lillia Mountain M.D.   On: 07/18/2017 13:42   Result Date: 07/19/2017 CLINICAL DATA:  Suspicious left breast mass and axillary adenopathy. EXAM: ULTRASOUND GUIDED CORE BIOPSIES OF THE LEFT BREAST AND LEFT AXILLA. COMPARISON:  Previous exam(s). FINDINGS: I met with the patient and we discussed the procedure of ultrasound-guided biopsy, including benefits and alternatives. We discussed the high likelihood of a successful procedure. We discussed the risks of the procedure, including infection, bleeding, tissue injury, clip migration, and inadequate sampling. Informed written consent was given. The usual time-out protocol was performed immediately prior to the procedure. Lesion quadrant: 3 o'clock region of the left breast. Using sterile technique and 1% Lidocaine and 1% lidocaine with epinephrine as local anesthetic, under direct ultrasound visualization, a 14 gauge spring-loaded device was used to perform biopsy of a mass in the 3 o'clock subareolar region of the left breast using a lateral to medial approach. At the conclusion of the procedure a heart shaped tissue marker clip was deployed into the biopsy cavity. Follow up 2 view mammogram was performed and dictated separately. I met with the patient and we discussed the procedure of  ultrasound-guided biopsy, including benefits and alternatives. We discussed the high likelihood of a successful procedure. We discussed the risks of the procedure, including infection, bleeding, tissue injury, clip migration, and inadequate sampling. Informed written consent was given. The usual time-out protocol was performed immediately prior to the procedure. Lesion quadrant:  Left axilla Using sterile technique and 1% Lidocaine and 1% lidocaine with epinephrine as local anesthetic, under direct ultrasound visualization, a 14 gauge spring-loaded device was used to perform biopsy of a left axillary lymph node using an inferior to superior approach. At the conclusion of the procedure a HydroMARK clip was placed in the left axilla but felt to be adjacent to the lymph node and not in the lymph node and a second coil shaped clip was deployed into the biopsy cavity. Follow up 2 view mammogram was performed and dictated separately. IMPRESSION: Ultrasound guided biopsies of the left breast and left axilla. No apparent complications. Electronically Signed: By: Lillia Mountain M.D. On: 07/12/2017 14:43    Assessment and plan- Patient is a 84 y.o. female with newly diagnosed invasive mammary carcinoma at least stage IIIB cT4b c N1MX ER PR positive and HER-2/neu negative on core biopsy  I discussed the results of the mammogram as well as biopsy with the patient's caregiver Ms. Tyree in detail.  Given the presence of satellite nodules in her left breast as well as biopsy-proven adenopathy patient has stage IIIb disease.  I would recommend getting a CT chest abdomen and pelvis as well as a bone scan for complete staging work-up.  Patient will be seeing surgery soon.  However if there is evidence of metastatic disease on her scans there would be no role for surgery.  If there is no evidence of metastatic disease-given that patient has T4 disease-she would likely need mastectomy and axillary lymph node dissection (given  extracapsular extension noted on lymph node biopsy).  Given her age and developmental disorder, I do not think that patient cannot sit still for her chemotherapy.  Her caregiver also acknowledges that patient overall is a poor historian and cannot verbalize her side effects well.  Chemotherapy would be associated with side effects such as hair loss nausea vomiting fatigue and risk of infections.  I therefore do not think that patient is a good chemotherapy candidate either neo-adjuvantly or adjuvantly.  Given that her tumor was strongly ER PR positive, I would recommend starting her on hormone therapy for 5 to 10 years.  Patient had a bone density scan 3 to 4 years ago which did show significant osteoporosis.  She ambulates with a walker and her osteoporosis can potentially be worsened on AI.  Patient has not had any prior history of thrombosis and is already status post hysterectomy.  I would therefore recommend tamoxifen 20 mg daily.  Discussed risks and benefits of tamoxifen including all but not limited to hot flashes, mood swings, risk of cataracts and DVT associated with it with her caregiver.  She will pass on the information to Ms. Glennon Mac who is her healthcare proxy is a Education officer, museum since patient is a ward of the state.  We will go ahead and get the tamoxifen started at this time.  If surgery is planned in the future, she may derive potential benefit from neoadjuvant tamoxifen.  Whether there would be a role for radiation treatment will depend on findings of CT scan which I will discuss with her in greater detail when I see her back in about 2 weeks time.  Again I doubt that even if there is a role for radiation treatment, patient can lay still for her treatments for 4 to 5 weeks.    I will also obtain bone density scan in the interim.  I will check her baseline labs CBC and CMP today.  Treatment will be  given with a potential curative intent pending the results of her staging  scans  Osteoporosis: I will discuss the role for oral bisphosphonates at her next visit  Cancer Staging Malignant neoplasm of left breast in female, estrogen receptor positive ([place]) Staging form: Breast, AJCC 8th Edition - Clinical stage from 07/28/2017: Stage IIIB (cT4b, cN1, cM0, G2, ER+, PR+, HER2-) - Signed by Sindy Guadeloupe, MD on 07/28/2017     Thank you for this kind referral and the opportunity to participate in the care of this patient   Visit Diagnosis 1. Malignant neoplasm of left breast in female, estrogen receptor positive, unspecified site of breast ([place])   2. Goals of care, counseling/discussion   3. Age related osteoporosis, unspecified pathological fracture presence     Dr. Randa Evens, MD, MPH [facility] [phone] 07/28/2017  9:04 AM

## 2017-07-28 NOTE — Telephone Encounter (Signed)
I think that would be a little far out for her to see the surgeon. I will forward to Melissa to see if there is an earlier time for the patient to be evaluated by the surgeon. I will also forward to [facility] to let her determine if she would like the patient to see a different surgeon.

## 2017-07-31 ENCOUNTER — Encounter: Payer: Self-pay | Admitting: Oncology

## 2017-07-31 NOTE — Telephone Encounter (Signed)
Yes she can be seen earlier with Dr.Byrnett. The hold up may be that they ([facility])was insistent on being seen only in [place] d/t transportation. Would you like me to proceed with sending her to Dr. Bary Castilla?

## 2017-07-31 NOTE — Telephone Encounter (Signed)
Lets try that. She needs to be seen asap

## 2017-07-31 NOTE — Telephone Encounter (Signed)
Hi dear,   Could she see Dr Terri Piedra? Would that be quicker than Dr Barry Dienes?

## 2017-08-01 NOTE — Telephone Encounter (Signed)
Noted! Thank you

## 2017-08-01 NOTE — Telephone Encounter (Signed)
[name] states that Kathy Mckay is scheduled with [facility] on 7/25, not 8/25. I have cancelled the referral to Dr. Bary Castilla.

## 2017-08-01 NOTE — Telephone Encounter (Signed)
Spoke with Mamie Laurel at [facility] who services as guardian for patient . Patient is scheduled for appointment on 08/03/17 at [facility].

## 2017-08-01 NOTE — Telephone Encounter (Signed)
It has been sent to Dr Dwyane Luo office as urgent

## 2017-08-01 NOTE — Telephone Encounter (Signed)
Left message for Kathy Mckay ,[facility] Social Worker to find out if patient has appointment.

## 2017-08-03 DIAGNOSIS — Z170 Estrogen receptor positive status [ER+]: Secondary | ICD-10-CM | POA: Diagnosis not present

## 2017-08-03 DIAGNOSIS — C50012 Malignant neoplasm of nipple and areola, left female breast: Secondary | ICD-10-CM | POA: Diagnosis not present

## 2017-08-04 ENCOUNTER — Ambulatory Visit
Admission: RE | Admit: 2017-08-04 | Discharge: 2017-08-04 | Disposition: A | Discharge status: Home / Self Care | Payer: Medicare Other | Source: Ambulatory Visit | Attending: Oncology | Admitting: Oncology

## 2017-08-04 ENCOUNTER — Encounter
Admission: RE | Admit: 2017-08-04 | Discharge: 2017-08-04 | Disposition: A | Discharge status: Home / Self Care | Payer: Medicare Other | Source: Ambulatory Visit | Attending: Oncology | Admitting: Oncology

## 2017-08-04 ENCOUNTER — Other Ambulatory Visit: Payer: Self-pay | Admitting: Oncology

## 2017-08-04 DIAGNOSIS — Z170 Estrogen receptor positive status [ER+]: Secondary | ICD-10-CM | POA: Insufficient documentation

## 2017-08-04 DIAGNOSIS — C50912 Malignant neoplasm of unspecified site of left female breast: Secondary | ICD-10-CM | POA: Insufficient documentation

## 2017-08-04 DIAGNOSIS — R918 Other nonspecific abnormal finding of lung field: Secondary | ICD-10-CM | POA: Diagnosis not present

## 2017-08-04 DIAGNOSIS — I700 Atherosclerosis of aorta: Secondary | ICD-10-CM | POA: Diagnosis not present

## 2017-08-04 MED ORDER — IOPAMIDOL (ISOVUE-300) INJECTION 61%
100.0000 mL | Freq: Once | INTRAVENOUS | Status: AC | PRN
  Administered 2017-08-04: 100 mL via INTRAVENOUS

## 2017-08-04 MED ORDER — TECHNETIUM TC 99M MEDRONATE IV KIT
21.0740 | PACK | Freq: Once | INTRAVENOUS | Status: AC | PRN
  Administered 2017-08-04: 21.074 via INTRAVENOUS

## 2017-08-11 ENCOUNTER — Encounter: Payer: Self-pay | Admitting: Oncology

## 2017-08-11 ENCOUNTER — Inpatient Hospital Stay: Payer: Medicare Other | Attending: Oncology | Admitting: Oncology

## 2017-08-11 ENCOUNTER — Encounter: Payer: Self-pay | Admitting: *Deleted

## 2017-08-11 VITALS — BP 149/83 | HR 105 | Temp 97.7°F | Resp 18 | Ht <= 58 in | Wt 89.7 lb

## 2017-08-11 DIAGNOSIS — Z7189 Other specified counseling: Secondary | ICD-10-CM

## 2017-08-11 DIAGNOSIS — M810 Age-related osteoporosis without current pathological fracture: Secondary | ICD-10-CM

## 2017-08-11 DIAGNOSIS — C50112 Malignant neoplasm of central portion of left female breast: Secondary | ICD-10-CM | POA: Diagnosis not present

## 2017-08-11 DIAGNOSIS — I10 Essential (primary) hypertension: Secondary | ICD-10-CM | POA: Diagnosis not present

## 2017-08-11 DIAGNOSIS — C7800 Secondary malignant neoplasm of unspecified lung: Secondary | ICD-10-CM | POA: Diagnosis not present

## 2017-08-11 DIAGNOSIS — C50912 Malignant neoplasm of unspecified site of left female breast: Secondary | ICD-10-CM

## 2017-08-11 DIAGNOSIS — Z170 Estrogen receptor positive status [ER+]: Secondary | ICD-10-CM

## 2017-08-11 DIAGNOSIS — Z79811 Long term (current) use of aromatase inhibitors: Secondary | ICD-10-CM

## 2017-08-11 DIAGNOSIS — F89 Unspecified disorder of psychological development: Secondary | ICD-10-CM

## 2017-08-11 DIAGNOSIS — Z79899 Other long term (current) drug therapy: Secondary | ICD-10-CM

## 2017-08-11 MED ORDER — PALBOCICLIB 100 MG PO CAPS: 100.0000 mg | ORAL_CAPSULE | Freq: Every day | ORAL | 6 refills | Status: DC

## 2017-08-11 NOTE — Progress Notes (Signed)
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

## 2017-08-11 NOTE — Progress Notes (Signed)
  Oncology Nurse Navigator Documentation  Navigator Location: [facility] (08/11/17 1100)   )Navigator Encounter Type: Clinic/MDC (08/11/17 1100)                         Barriers/Navigation Needs: Coordination of Care (08/11/17 1100)                          Time Spent with Patient: 2 (08/11/17 1100)   Met patient and her care giver Kathy Mckay, today during her follow-up with Dr. [name].  Dr. [name] discussed case at tumor board on Monday and with Dr. Hassell Done today, and they have agreed to start [name] and cancel surgery unless her breast lesion worsens or her drainage increases.  Dr. [name] discussed plan with her assigned DSS social worker Lasha over the phone.  Kathy Mckay was encouraged to call with any questions or needs.

## 2017-08-11 NOTE — Progress Notes (Signed)
No new changes noted today 

## 2017-08-11 NOTE — Progress Notes (Signed)
Hematology/Oncology Consult note [facility]  Telephone:([phone] Fax:([phone]) [phone]  Patient Care Team: Burnard Hawthorne, FNP as PCP - General (Family Medicine)   Name of the patient: Kathy Mckay  [id number]  01-10-1933   Date of visit: 08/11/17  Diagnosis-stage IV invasive mammary carcinoma of the left breast cT4b cN1 M1 with lung metastases ER PR positive HER-2/neu negative  Chief complaint/ Reason for visit- discuss CT scan results and further management  Heme/Onc history: patient is a 84 year old female with developmental disorder.  She is a ward of the state and lives in a group home.  She is here with her caregiver Ms. Tyree today.  She has been at the group home for the last 4 years.  She does not have any living family other than a distant cousin.  Her assigned Education officer, museum Ms. Dayton Scrape is her healthcare proxy. Her past medical history is significant for hypertension hyperlipidemia and osteoporosis among other medical problems.  Her caregiver states that overall this patient is a happy-go-lucky person and has been doing well at her nursing home.  She has not had any recent falls or hospitalizations.  She recently underwent a screening mammogram which picked up a mass in her left breast.  Patient has not had a prior mammogram before this for years.  Mammogram and ultrasound showed 2.9 x 2.8 x 3 cm mass in the left retroareolar region.  There is extension into the skin and nipple areole or complex.  Associated skin thickening.  At least 2 abnormal appearing left axillary lymph nodes with cortical thickening up to 1 cm.  Core biopsy of the breast mass and the axillary lymph node revealed invasive mammary carcinoma, grade 2 ER Greater than 90% and PR 11-50% positive and HER-2/neu negative.  Extracapsular extension was noted in the lymph node biopsy.  Patient herself is unable to understand her ongoing medical issues she denies any complaints  today.  Caregiver reports that her appetite is good.  And she has not had any unintentional weight loss  CT chest abdomen and pelvis showed left breast mass, suspicious left axillary and internal mammary adenopathy along with bilateral pulmonary nodules measuring up to 16 mm in diameter consistent with metastatic disease.  No evidence of bone metastases on bone scan  Interval history-patient has lost 8 pounds over the last 2 weeks.  Her appetite however continues to be good.  She has not had any falls or other acute events at her group home.  ECOG PS- 2-3 Pain scale- 0   Review of systems- Review of Systems  Unable to perform ROS: Psychiatric disorder      No Known Allergies   Past Medical History:  Diagnosis Date  . Developmental delay   . GERD (gastroesophageal reflux disease)   . Hyperlipidemia   . Hypertension   . Osteoporosis      Past Surgical History:  Procedure Laterality Date  . ABDOMINAL HYSTERECTOMY    . SHOULDER SURGERY      Social History   Socioeconomic History  . Marital status: Single    Spouse name: Not on file  . Number of children: Not on file  . Years of education: Not on file  . Highest education level: Not on file  Occupational History  . Not on file  Social Needs  . Financial resource strain: Not on file  . Food insecurity:    Worry: Not on file    Inability: Not on file  . Transportation  needs:    Medical: Not on file    Non-medical: Not on file  Tobacco Use  . Smoking status: Never Smoker  . Smokeless tobacco: Never Used  Substance and Sexual Activity  . Alcohol use: No  . Drug use: No  . Sexual activity: Never  Lifestyle  . Physical activity:    Days per week: Not on file    Minutes per session: Not on file  . Stress: Not on file  Relationships  . Social connections:    Talks on phone: Not on file    Gets together: Not on file    Attends religious service: Not on file    Active member of club or organization: Not on file      Attends meetings of clubs or organizations: Not on file    Relationship status: Not on file  . Intimate partner violence:    Fear of current or ex partner: Not on file    Emotionally abused: Not on file    Physically abused: Not on file    Forced sexual activity: Not on file  Other Topics Concern  . Not on file  Social History Narrative   Lives at a family care home. No living relatives. Guardian of the state.      Lives [place] [facility] [place] for past 6 years.     Family History  Problem Relation Age of Onset  . Diabetes Mother   . Diabetes Father   . Cancer Sister      Current Outpatient Medications:  .  amLODipine (NORVASC) 10 MG tablet, TAKE 1 TABLET BY MOUTH DAILY, Disp: 90 tablet, Rfl: 1 .  atorvastatin (LIPITOR) 40 MG tablet, TAKE 1 TABLET BY MOUTH ONCE DAILY, Disp: 90 tablet, Rfl: 1 .  esomeprazole (NEXIUM) 40 MG capsule, TAKE 1 CAPSULE BY MOUTH ONCE DAILY AT 8 AM, Disp: 90 capsule, Rfl: 0 .  mirtazapine (REMERON) 45 MG tablet, TAKE ONE TABLET BY MOUTH AT BEDTIME., Disp: 30 tablet, Rfl: 1 .  tamoxifen (NOLVADEX) 20 MG tablet, Take 1 tablet (20 mg total) by mouth daily., Disp: 30 tablet, Rfl: 3 .  palbociclib (IBRANCE) 100 MG capsule, Take 1 capsule (100 mg total) by mouth daily with breakfast. Take whole with food. Take for 21 days on, 7 days off, repeat every 28 days., Disp: 21 capsule, Rfl: 6  Physical exam:  Vitals:   08/11/17 1057  BP: (!) 149/83  Pulse: (!) 105  Resp: 18  Temp: 97.7 F (36.5 C)  TempSrc: Tympanic  Weight: 89 lb 11.2 oz (40.7 kg)  Height: '4\' 10"'  (1.473 m)   Physical Exam  Constitutional: She is oriented to person, place, and time.  Frail elderly woman sitting in a wheelchair.  Does not appear to be in any acute distress  HENT:  Head: Normocephalic and atraumatic.  Eyes: Pupils are equal, round, and reactive to light. EOM are normal.  Neck: Normal range of motion.  Cardiovascular: Normal rate, regular rhythm and normal heart sounds.   Pulmonary/Chest: Effort normal and breath sounds normal.  Abdominal: Soft. Bowel sounds are normal.  Neurological: She is alert and oriented to person, place, and time.  Skin: Skin is warm and dry.  Palpable left breast mass with satellite nodules involving the skin present in the retroareolar region.  There is scant serosanguineous discharge from these nodules  CMP Latest Ref Rng & Units 07/28/2017  Glucose 70 - 99 mg/dL 157(H)  BUN 8 - 23 mg/dL 22  Creatinine 0.44 - 1.00  mg/dL 0.95  Sodium 135 - 145 mmol/L 137  Potassium 3.5 - 5.1 mmol/L 4.1  Chloride 98 - 111 mmol/L 104  CO2 22 - 32 mmol/L 18(L)  Calcium 8.9 - 10.3 mg/dL 9.6  Total Protein 6.5 - 8.1 g/dL 8.4(H)  Total Bilirubin 0.3 - 1.2 mg/dL 1.1  Alkaline Phos 38 - 126 U/L 131(H)  AST 15 - 41 U/L 47(H)  ALT 0 - 44 U/L 42   CBC Latest Ref Rng & Units 07/28/2017  WBC 3.6 - 11.0 K/uL 9.1  Hemoglobin 12.0 - 16.0 g/dL 16.3(H)  Hematocrit 35.0 - 47.0 % 48.7(H)  Platelets 150 - 440 K/uL 269    No images are attached to the encounter.  Ct Chest W Contrast  Result Date: 08/04/2017 CLINICAL DATA:  Left-sided breast cancer. EXAM: CT CHEST, ABDOMEN, AND PELVIS WITH CONTRAST TECHNIQUE: Multidetector CT imaging of the chest, abdomen and pelvis was performed following the standard protocol during bolus administration of intravenous contrast. CONTRAST:  134m ISOVUE-300 IOPAMIDOL (ISOVUE-300) INJECTION 61% COMPARISON:  Chest CT 02/21/2013 FINDINGS: CT CHEST FINDINGS Cardiovascular: The heart is enlarged. No pericardial effusion. Coronary artery calcification is evident. Atherosclerotic calcification is noted in the wall of the thoracic aorta. Mediastinum/Nodes: No mediastinal lymphadenopathy. There is no hilar lymphadenopathy. The esophagus has normal imaging features. No right axillary lymphadenopathy. 9 mm short axis left axillary lymph node evident (series 2: Image 13). Lungs/Pleura: Evaluation of lung parenchyma is markedly degraded by  patient breathing motion. Underlying chronic interstitial changes are suspected with bronchiectasis and chronic atelectasis or scarring in the lower lobes, left greater than right. Bilateral pulmonary nodules are evident including 16 mm left upper lobe nodule (4:61), 14 mm right middle lobe nodule (4:64), 10 mm left perifissural nodule (4:60) and 8 mm right lower lobe nodule (4:93). Other scattered smaller pulmonary nodules are evident bilaterally. Musculoskeletal: Bones are diffusely demineralized. No overtly suspicious lytic or sclerotic osseous abnormality. 3.0 x 3.5 cm retroareolar left breast mass evident. CT ABDOMEN PELVIS FINDINGS Hepatobiliary: No focal abnormality within the liver parenchyma. There is no evidence for gallstones, gallbladder wall thickening, or pericholecystic fluid. Mild intra and extrahepatic biliary duct dilatation evident with tapering of the common bile duct in the head of the pancreas. Common bile duct measures 8 mm in the pancreatic head, upper normal for patient age. Pancreas: No focal mass lesion. No dilatation of the main duct. No intraparenchymal cyst. No peripancreatic edema. Spleen: No splenomegaly. No focal mass lesion. Adrenals/Urinary Tract: No adrenal nodule or mass. Cortical scarring noted in both kidneys without enhancing renal mass evident. 6 mm low-density lesion lower pole left kidney is likely a cyst No evidence for hydroureter. The urinary bladder appears normal for the degree of distention. Stomach/Bowel: Stomach is nondistended. No gastric wall thickening. No evidence of outlet obstruction. Duodenum is normally positioned as is the ligament of Treitz. No small bowel wall thickening. No small bowel dilatation. The terminal ileum is normal. The appendix is not visualized, but there is no edema or inflammation in the region of the cecum. Diverticular changes are noted in the left colon without evidence of diverticulitis. Vascular/Lymphatic: There is abdominal aortic  atherosclerosis without aneurysm. There is no gastrohepatic or hepatoduodenal ligament lymphadenopathy. No intraperitoneal or retroperitoneal lymphadenopathy. No pelvic sidewall lymphadenopathy. Reproductive: Uterus unremarkable.  There is no adnexal mass. Other: No intraperitoneal free fluid. Musculoskeletal: Bones are diffusely demineralized. No suspicious lytic or sclerotic osseous abnormality. Sagittal imaging demonstrates compression fractures at T3 and T10. IMPRESSION: 1. Left breast mass identified  deep to the areola with a with 9 mm short axis but rounded lymph node in the left axilla, suspicious for metastatic disease. This lymph node appears to have a localization clip in may have already been sampled. 2. Bilateral pulmonary nodules measuring up to 16 mm diameter, consistent with metastatic disease. 3. Intra and extrahepatic biliary duct dilatation without obstructing lesion evident. Correlation with liver function test may prove helpful. 4.  Aortic Atherosclerois (ICD10-170.0) Electronically Signed   By: Misty Stanley M.D.   On: 08/04/2017 15:03   Nm Bone Scan Whole Body  Result Date: 08/04/2017 CLINICAL DATA:  Left breast malignancy. No pain. No recent falls or trauma. EXAM: NUCLEAR MEDICINE WHOLE BODY BONE SCAN TECHNIQUE: Whole body anterior and posterior images were obtained approximately 3 hours after intravenous injection of radiopharmaceutical. RADIOPHARMACEUTICALS:  21.074 mCi Technetium-50mMDP IV COMPARISON:  CT, chest abdomen and pelvis performed today. FINDINGS: There is relative increased radiotracer accumulation right ischium compared to the left. I believe this is positional due to some rotation, right ischium closer to the detector. Metastatic disease is felt unlikely given that this is a solitary finding and that the corresponding area on CT shows no abnormality. There are no other areas of abnormal radiotracer localization to suggest metastatic disease to bone. There is significant  degenerative uptake along the spine, particularly the thoracic spine, involving the shoulders, sternoclavicular joints, knees, feet and ankles as well as both elbows and wrists. Renal uptake is symmetric. IMPRESSION: 1. No convincing metastatic disease to bone. 2. Significant degenerative uptake. Electronically Signed   By: DLajean ManesM.D.   On: 08/04/2017 15:55   Ct Abdomen Pelvis W Contrast  Result Date: 08/04/2017 CLINICAL DATA:  Left-sided breast cancer. EXAM: CT CHEST, ABDOMEN, AND PELVIS WITH CONTRAST TECHNIQUE: Multidetector CT imaging of the chest, abdomen and pelvis was performed following the standard protocol during bolus administration of intravenous contrast. CONTRAST:  1034mISOVUE-300 IOPAMIDOL (ISOVUE-300) INJECTION 61% COMPARISON:  Chest CT 02/21/2013 FINDINGS: CT CHEST FINDINGS Cardiovascular: The heart is enlarged. No pericardial effusion. Coronary artery calcification is evident. Atherosclerotic calcification is noted in the wall of the thoracic aorta. Mediastinum/Nodes: No mediastinal lymphadenopathy. There is no hilar lymphadenopathy. The esophagus has normal imaging features. No right axillary lymphadenopathy. 9 mm short axis left axillary lymph node evident (series 2: Image 13). Lungs/Pleura: Evaluation of lung parenchyma is markedly degraded by patient breathing motion. Underlying chronic interstitial changes are suspected with bronchiectasis and chronic atelectasis or scarring in the lower lobes, left greater than right. Bilateral pulmonary nodules are evident including 16 mm left upper lobe nodule (4:61), 14 mm right middle lobe nodule (4:64), 10 mm left perifissural nodule (4:60) and 8 mm right lower lobe nodule (4:93). Other scattered smaller pulmonary nodules are evident bilaterally. Musculoskeletal: Bones are diffusely demineralized. No overtly suspicious lytic or sclerotic osseous abnormality. 3.0 x 3.5 cm retroareolar left breast mass evident. CT ABDOMEN PELVIS FINDINGS  Hepatobiliary: No focal abnormality within the liver parenchyma. There is no evidence for gallstones, gallbladder wall thickening, or pericholecystic fluid. Mild intra and extrahepatic biliary duct dilatation evident with tapering of the common bile duct in the head of the pancreas. Common bile duct measures 8 mm in the pancreatic head, upper normal for patient age. Pancreas: No focal mass lesion. No dilatation of the main duct. No intraparenchymal cyst. No peripancreatic edema. Spleen: No splenomegaly. No focal mass lesion. Adrenals/Urinary Tract: No adrenal nodule or mass. Cortical scarring noted in both kidneys without enhancing renal mass evident. 6 mm  low-density lesion lower pole left kidney is likely a cyst No evidence for hydroureter. The urinary bladder appears normal for the degree of distention. Stomach/Bowel: Stomach is nondistended. No gastric wall thickening. No evidence of outlet obstruction. Duodenum is normally positioned as is the ligament of Treitz. No small bowel wall thickening. No small bowel dilatation. The terminal ileum is normal. The appendix is not visualized, but there is no edema or inflammation in the region of the cecum. Diverticular changes are noted in the left colon without evidence of diverticulitis. Vascular/Lymphatic: There is abdominal aortic atherosclerosis without aneurysm. There is no gastrohepatic or hepatoduodenal ligament lymphadenopathy. No intraperitoneal or retroperitoneal lymphadenopathy. No pelvic sidewall lymphadenopathy. Reproductive: Uterus unremarkable.  There is no adnexal mass. Other: No intraperitoneal free fluid. Musculoskeletal: Bones are diffusely demineralized. No suspicious lytic or sclerotic osseous abnormality. Sagittal imaging demonstrates compression fractures at T3 and T10. IMPRESSION: 1. Left breast mass identified deep to the areola with a with 9 mm short axis but rounded lymph node in the left axilla, suspicious for metastatic disease. This lymph  node appears to have a localization clip in may have already been sampled. 2. Bilateral pulmonary nodules measuring up to 16 mm diameter, consistent with metastatic disease. 3. Intra and extrahepatic biliary duct dilatation without obstructing lesion evident. Correlation with liver function test may prove helpful. 4.  Aortic Atherosclerois (ICD10-170.0) Electronically Signed   By: Misty Stanley M.D.   On: 08/04/2017 15:03   Mm Clip Placement Left  Result Date: 07/12/2017 CLINICAL DATA:  Status post ultrasound-guided core biopsies of the left breast and left axilla. EXAM: DIAGNOSTIC LEFT MAMMOGRAM POST ULTRASOUND BIOPSIES COMPARISON:  Previous exam(s). FINDINGS: Mammographic images were obtained following ultrasound-guided core biopsies of a left breast mass and a left axillary lymph node. Mammographic images show there is a heart shaped clip in the mass in the retroareolar region of the left breast. Multiple attempts at an axillary view were obtained. A single marker clip is noted in the left axilla. The second marker clip is likely not visualized secondary to its posterior location. IMPRESSION: Status post ultrasound-guided core biopsies of the left breast and left axilla with pathology pending. Final Assessment: Post Procedure Mammograms for Marker Placement Electronically Signed   By: Lillia Mountain M.D.   On: 07/12/2017 14:46   [place] Lt Breast Bx W Loc Dev 1st Lesion Img Bx Spec US Guide  Addendum Date: 07/19/2017   ADDENDUM REPORT: 07/18/2017 13:42 ADDENDUM: Pathology of the left breast biopsy revealed A. LEFT BREAST, 3:00 RETROAREOLAR; ULTRASOUND-GUIDED BIOPSY: INVASIVE MAMMARY CARCINOMA, NO SPECIAL TYPE. Size of invasive carcinoma: 17 mm in this sample. Histologic grade of invasive carcinoma: Grade 2. Ductal carcinoma in situ: Not identified. Lymphovascular invasion: Present. Pathology of the left axillary lymph node revealed B. LYMPH NODE, LEFT AXILLA; ULTRASOUND-GUIDED BIOPSY: METASTATIC CARCINOMA.  EXTRACAPSULAR EXTENSION IS PRESENT. METASTATIC CARCINOMA MEASURES 6 MM IN THE SPECIMEN. ER/PR/HER2: Immunohistochemistry will be performed on block A1, with reflex to [place] for HER2 2+. The results will be reported in an addendum. Comment: The definitive grade will be assigned on the excisional specimen. These findings were communicated to [facility] in Dr. Clarise Cruz office on 07/14/2017. Read back procedure was performed. This was found to be concordant by Dr. Miquel Dunn. Recommendation: Surgical and oncology referrals. Results and recommendations were relayed to Cyril Mourning, nurse for Mable Paris, NP on 07/14/17. The provider is out of the office that afternoon. Cyril Mourning will ask about the referral on Monday and notify Jetta Lout, RRA if they will make  the referral or request the nurse navigators to make the referral. At the patient's request, results were relayed to the patient's caregiver, [name] by phone by Dr. Miquel Dunn on 07/14/17. The patient did well following the biopsy. All of their questions were answered. They will be contacted regarding referral after the provider has reviewed the results. Cyril Mourning, nurse for Mable Paris, NP called on 07/17/17 and stated that their office will make the referral. Addendum by Jetta Lout, [place] on 07/18/17. Electronically Signed   By: Lillia Mountain M.D.   On: 07/18/2017 13:42   Result Date: 07/19/2017 CLINICAL DATA:  Suspicious left breast mass and axillary adenopathy. EXAM: ULTRASOUND GUIDED CORE BIOPSIES OF THE LEFT BREAST AND LEFT AXILLA. COMPARISON:  Previous exam(s). FINDINGS: I met with the patient and we discussed the procedure of ultrasound-guided biopsy, including benefits and alternatives. We discussed the high likelihood of a successful procedure. We discussed the risks of the procedure, including infection, bleeding, tissue injury, clip migration, and inadequate sampling. Informed written consent was given. The usual time-out protocol was performed immediately prior to the  procedure. Lesion quadrant: 3 o'clock region of the left breast. Using sterile technique and 1% Lidocaine and 1% lidocaine with epinephrine as local anesthetic, under direct ultrasound visualization, a 14 gauge spring-loaded device was used to perform biopsy of a mass in the 3 o'clock subareolar region of the left breast using a lateral to medial approach. At the conclusion of the procedure a heart shaped tissue marker clip was deployed into the biopsy cavity. Follow up 2 view mammogram was performed and dictated separately. I met with the patient and we discussed the procedure of ultrasound-guided biopsy, including benefits and alternatives. We discussed the high likelihood of a successful procedure. We discussed the risks of the procedure, including infection, bleeding, tissue injury, clip migration, and inadequate sampling. Informed written consent was given. The usual time-out protocol was performed immediately prior to the procedure. Lesion quadrant: Left axilla Using sterile technique and 1% Lidocaine and 1% lidocaine with epinephrine as local anesthetic, under direct ultrasound visualization, a 14 gauge spring-loaded device was used to perform biopsy of a left axillary lymph node using an inferior to superior approach. At the conclusion of the procedure a HydroMARK clip was placed in the left axilla but felt to be adjacent to the lymph node and not in the lymph node and a second coil shaped clip was deployed into the biopsy cavity. Follow up 2 view mammogram was performed and dictated separately. IMPRESSION: Ultrasound guided biopsies of the left breast and left axilla. No apparent complications. Electronically Signed: By: Lillia Mountain M.D. On: 07/12/2017 14:43   [place] Lt Breast Bx W Loc Dev Ea Add Lesion Img Bx Spec US Guide  Addendum Date: 07/19/2017   ADDENDUM REPORT: 07/18/2017 13:42 ADDENDUM: Pathology of the left breast biopsy revealed A. LEFT BREAST, 3:00 RETROAREOLAR; ULTRASOUND-GUIDED BIOPSY:  INVASIVE MAMMARY CARCINOMA, NO SPECIAL TYPE. Size of invasive carcinoma: 17 mm in this sample. Histologic grade of invasive carcinoma: Grade 2. Ductal carcinoma in situ: Not identified. Lymphovascular invasion: Present. Pathology of the left axillary lymph node revealed B. LYMPH NODE, LEFT AXILLA; ULTRASOUND-GUIDED BIOPSY: METASTATIC CARCINOMA. EXTRACAPSULAR EXTENSION IS PRESENT. METASTATIC CARCINOMA MEASURES 6 MM IN THE SPECIMEN. ER/PR/HER2: Immunohistochemistry will be performed on block A1, with reflex to [place] for HER2 2+. The results will be reported in an addendum. Comment: The definitive grade will be assigned on the excisional specimen. These findings were communicated to [facility] in Dr. Clarise Cruz office on 07/14/2017.  Read back procedure was performed. This was found to be concordant by Dr. Miquel Dunn. Recommendation: Surgical and oncology referrals. Results and recommendations were relayed to Cyril Mourning, nurse for Mable Paris, NP on 07/14/17. The provider is out of the office that afternoon. Cyril Mourning will ask about the referral on Monday and notify Jetta Lout, [place] if they will make the referral or request the nurse navigators to make the referral. At the patient's request, results were relayed to the patient's caregiver, [name] by phone by Dr. Miquel Dunn on 07/14/17. The patient did well following the biopsy. All of their questions were answered. They will be contacted regarding referral after the provider has reviewed the results. Cyril Mourning, nurse for Mable Paris, NP called on 07/17/17 and stated that their office will make the referral. Addendum by Jetta Lout, [facility] on 07/18/17. Electronically Signed   By: Lillia Mountain M.D.   On: 07/18/2017 13:42   Result Date: 07/19/2017 CLINICAL DATA:  Suspicious left breast mass and axillary adenopathy. EXAM: ULTRASOUND GUIDED CORE BIOPSIES OF THE LEFT BREAST AND LEFT AXILLA. COMPARISON:  Previous exam(s). FINDINGS: I met with the patient and we discussed the procedure of  ultrasound-guided biopsy, including benefits and alternatives. We discussed the high likelihood of a successful procedure. We discussed the risks of the procedure, including infection, bleeding, tissue injury, clip migration, and inadequate sampling. Informed written consent was given. The usual time-out protocol was performed immediately prior to the procedure. Lesion quadrant: 3 o'clock region of the left breast. Using sterile technique and 1% Lidocaine and 1% lidocaine with epinephrine as local anesthetic, under direct ultrasound visualization, a 14 gauge spring-loaded device was used to perform biopsy of a mass in the 3 o'clock subareolar region of the left breast using a lateral to medial approach. At the conclusion of the procedure a heart shaped tissue marker clip was deployed into the biopsy cavity. Follow up 2 view mammogram was performed and dictated separately. I met with the patient and we discussed the procedure of ultrasound-guided biopsy, including benefits and alternatives. We discussed the high likelihood of a successful procedure. We discussed the risks of the procedure, including infection, bleeding, tissue injury, clip migration, and inadequate sampling. Informed written consent was given. The usual time-out protocol was performed immediately prior to the procedure. Lesion quadrant: Left axilla Using sterile technique and 1% Lidocaine and 1% lidocaine with epinephrine as local anesthetic, under direct ultrasound visualization, a 14 gauge spring-loaded device was used to perform biopsy of a left axillary lymph node using an inferior to superior approach. At the conclusion of the procedure a HydroMARK clip was placed in the left axilla but felt to be adjacent to the lymph node and not in the lymph node and a second coil shaped clip was deployed into the biopsy cavity. Follow up 2 view mammogram was performed and dictated separately. IMPRESSION: Ultrasound guided biopsies of the left breast and left  axilla. No apparent complications. Electronically Signed: By: Lillia Mountain M.D. On: 07/12/2017 14:43     Assessment and plan- Patient is a 84 y.o. female with newly diagnosed invasive mammary carcinoma of the left breast stage IV cT4b cN1 M1 ER PR positive HER-2/neu negative with lung metastases  I have reviewed CT chest abdomen and pelvis as well as bone scan images independently.  We also discussed her case at the tumor board yesterday.  She has scattered bilateral pulmonary nodules the appearance of which is highly concerning for malignancy.  Given her age, developmental disorder as well as significant  kyphosis it would not be safe to get a CT-guided biopsy of these lung nodules.    I have spoken to Dr. Hassell Done today as well.  Given that she has stage IV breast cancer there would be no role for curative surgery.  She however does have left breast mass which is involving her skin and causing some discharge from the satellite lesions as well.   However plan at this time is to proceed with tamoxifen (patient has already started taking that. AI not given due to severe osteoporosis) along with ibrance 100 mg daily 3 weeks on and 1 week off. Our oral pharmacist Mitchell Heir will reach out to her caregiver next week. We will also reach out to lashae Hickman- patients SW and HCP as she is ward of the state- to obtain written consent. I did speak to her over the phone and explained risks and benefits of treatment.   Repeat cbc with diff, cmp in 2 weeks and 4 weeks and I will see her back in 4 weeks. Will also check CA15-2 and CA 27.29 in 2 weeks   Visit Diagnosis 1. Malignant neoplasm of left breast in female, estrogen receptor positive, unspecified site of breast ([place])   2. Goals of care, counseling/discussion      Dr. Randa Evens, MD, MPH [facility] [phone] 08/14/2017 8:04 AM

## 2017-08-15 ENCOUNTER — Telehealth: Payer: Self-pay | Admitting: Pharmacy Technician

## 2017-08-15 ENCOUNTER — Telehealth: Payer: Self-pay | Admitting: Pharmacist

## 2017-08-15 DIAGNOSIS — Z170 Estrogen receptor positive status [ER+]: Principal | ICD-10-CM

## 2017-08-15 DIAGNOSIS — C50912 Malignant neoplasm of unspecified site of left female breast: Secondary | ICD-10-CM

## 2017-08-15 MED ORDER — PALBOCICLIB 100 MG PO CAPS: 100.0000 mg | ORAL_CAPSULE | Freq: Every day | ORAL | 6 refills | Status: DC

## 2017-08-15 NOTE — Telephone Encounter (Signed)
Oral Oncology Pharmacist Encounter  Received new prescription for Ibrance (palbociclib) for the treatment of metastatic breast cancer ER PR positive HER-2/neu negative in conjunction with anastrozole, planned duration until disease progression or unacceptable drug toxicity.  CBC/CMP from 07/28/17 assessed, no relevant lab abnormalities. Prescription dose and frequency assessed.   Current medication list in Epic reviewed, 3 relevant DDIs with Ibrance identified: -Ibrance can increase the concentration of amlodipine, atorvastatin, and mirtazapine. Patient should be monitored for increased side effects related to higher concentrations of amlodipine, atorvastatin, and mirtazapine.  Prescription has been e-scribed to the [facility] for benefits analysis and approval.  Oral Oncology Clinic will continue to follow for insurance authorization, copayment issues, initial counseling and start date.  [name], PharmD, BCPS, [facility] Hematology/Oncology Clinical Pharmacist [facility] [place] [facility] [phone]  08/15/2017 2:04 PM

## 2017-08-15 NOTE — Telephone Encounter (Signed)
Oral Oncology Patient Advocate Encounter  Received notification from [employer] that prior authorization for Kathy Mckay is required.  PA submitted on CoverMyMeds Key [id number] Status is pending  Oral Oncology Clinic will continue to follow.  [place] Patient [place] Phone [phone] Fax [phone] 08/15/2017 4:32 PM

## 2017-08-16 ENCOUNTER — Telehealth: Payer: Self-pay | Admitting: *Deleted

## 2017-08-16 NOTE — Telephone Encounter (Signed)
Called and left message with ms. Hickman to see if they have read over side effects of Ibrance to see if they are going to consent for the patient to take the med. I left my number to call and my fax number to send consent or let us know when to expect a decision

## 2017-08-16 NOTE — Telephone Encounter (Signed)
Oral Oncology Patient Advocate Encounter  Prior Authorization for Leslee Home has been approved.    PA# [id number] Effective dates: 08/15/17 through 01/09/18  Oral Oncology Clinic will continue to follow.   [place] Patient [place] Phone [phone] Fax [phone] 08/16/2017 8:08 AM

## 2017-08-21 ENCOUNTER — Inpatient Hospital Stay: Payer: Medicare Other

## 2017-08-21 ENCOUNTER — Ambulatory Visit
Admission: RE | Admit: 2017-08-21 | Discharge: 2017-08-21 | Disposition: A | Discharge status: Home / Self Care | Payer: Medicare Other | Source: Ambulatory Visit | Attending: Oncology | Admitting: Oncology

## 2017-08-21 ENCOUNTER — Telehealth: Payer: Self-pay | Admitting: Pharmacy Technician

## 2017-08-21 DIAGNOSIS — Z170 Estrogen receptor positive status [ER+]: Principal | ICD-10-CM

## 2017-08-21 DIAGNOSIS — C50112 Malignant neoplasm of central portion of left female breast: Secondary | ICD-10-CM | POA: Diagnosis not present

## 2017-08-21 DIAGNOSIS — Z79899 Other long term (current) drug therapy: Secondary | ICD-10-CM | POA: Diagnosis not present

## 2017-08-21 DIAGNOSIS — C50912 Malignant neoplasm of unspecified site of left female breast: Secondary | ICD-10-CM | POA: Diagnosis not present

## 2017-08-21 DIAGNOSIS — Z13820 Encounter for screening for osteoporosis: Secondary | ICD-10-CM | POA: Insufficient documentation

## 2017-08-21 DIAGNOSIS — Z79810 Long term (current) use of selective estrogen receptor modulators (SERMs): Secondary | ICD-10-CM | POA: Insufficient documentation

## 2017-08-21 DIAGNOSIS — I10 Essential (primary) hypertension: Secondary | ICD-10-CM | POA: Diagnosis not present

## 2017-08-21 DIAGNOSIS — Z79811 Long term (current) use of aromatase inhibitors: Secondary | ICD-10-CM | POA: Diagnosis not present

## 2017-08-21 DIAGNOSIS — M810 Age-related osteoporosis without current pathological fracture: Secondary | ICD-10-CM | POA: Insufficient documentation

## 2017-08-21 DIAGNOSIS — C7800 Secondary malignant neoplasm of unspecified lung: Secondary | ICD-10-CM | POA: Diagnosis not present

## 2017-08-21 LAB — COMPREHENSIVE METABOLIC PANEL
ALT: 34 U/L (ref 0–44)
AST: 42 U/L — ABNORMAL HIGH (ref 15–41)
Albumin: 4.2 g/dL (ref 3.5–5.0)
Alkaline Phosphatase: 95 U/L (ref 38–126)
Anion gap: 12 (ref 5–15)
BILIRUBIN TOTAL: 0.9 mg/dL (ref 0.3–1.2)
BUN: 23 mg/dL (ref 8–23)
CALCIUM: 9 mg/dL (ref 8.9–10.3)
CHLORIDE: 106 mmol/L (ref 98–111)
CO2: 20 mmol/L — ABNORMAL LOW (ref 22–32)
CREATININE: 0.83 mg/dL (ref 0.44–1.00)
Glucose, Bld: 193 mg/dL — ABNORMAL HIGH (ref 70–99)
Potassium: 3.5 mmol/L (ref 3.5–5.1)
Sodium: 138 mmol/L (ref 135–145)
Total Protein: 7.4 g/dL (ref 6.5–8.1)

## 2017-08-21 LAB — CBC WITH DIFFERENTIAL/PLATELET
Basophils Absolute: 0 10*3/uL (ref 0–0.1)
Basophils Relative: 1 %
EOS PCT: 1 %
Eosinophils Absolute: 0 10*3/uL (ref 0–0.7)
HEMATOCRIT: 46.9 % (ref 35.0–47.0)
Hemoglobin: 16 g/dL (ref 12.0–16.0)
LYMPHS PCT: 13 %
Lymphs Abs: 0.8 10*3/uL — ABNORMAL LOW (ref 1.0–3.6)
MCH: 33.6 pg (ref 26.0–34.0)
MCHC: 34.1 g/dL (ref 32.0–36.0)
MCV: 98.7 fL (ref 80.0–100.0)
MONO ABS: 0.6 10*3/uL (ref 0.2–0.9)
Monocytes Relative: 9 %
NEUTROS ABS: 5.1 10*3/uL (ref 1.4–6.5)
Neutrophils Relative %: 76 %
PLATELETS: 210 10*3/uL (ref 150–440)
RBC: 4.75 MIL/uL (ref 3.80–5.20)
RDW: 13.6 % (ref 11.5–14.5)
WBC: 6.6 10*3/uL (ref 3.6–11.0)

## 2017-08-21 MED FILL — IBRANCE 100 MG CAPSULE: 100 | 28 days supply | Qty: 21 | Fill #0

## 2017-08-21 NOTE — Telephone Encounter (Signed)
Oral Chemotherapy Pharmacist Encounter  Patient Education I spoke with patient's caregiver Kathy Mckay for overview of new oral chemotherapy medication: Ibrance (palbociclib) for the treatment of metastatic breast cancer ER PR positive HER-2/neu negative in conjunction with tamoxifen (AI not given due to severe osteoporosis), planned duration until disease progression or unacceptable drug toxicity.  Counseled Kathy Mckay on administration, dosing, side effects, monitoring, drug-food interactions, safe handling, storage, and disposal. Patient will take 1 capsule (100 mg total) by mouth daily with breakfast. Take for 21 days on, 7 days off, repeat every 28 days.  Side effects include but not limited to: decreased wbc/hgb/plt, fatigue, N/V, hair, mouth sores.    Reviewed with patient importance of keeping a medication schedule and plan for any missed doses.  Kathy Mckay voiced understanding and appreciation. All questions answered. Medication handout placed in mail.   Provided patient with Oral [place] [facility] phone number. Patient knows to call the office with questions or concerns. Oral Chemotherapy Navigation Clinic will continue to follow.  [name], PharmD, BCPS, [facility] Hematology/Oncology Clinical Pharmacist [facility] [place] [facility] [phone]  08/21/2017 2:22 PM

## 2017-08-21 NOTE — Telephone Encounter (Signed)
Oral Oncology Patient Advocate Encounter  Was successful in securing patient a $4000 grant from [place] to provide copayment coverage for Ibrance.  This will keep the out of pocket expense at $0.   I have spoke with patients caregiver, [name], to inform her of the approval.  The billing information is as follows and has been shared with [place].   Member ID: [id number] Group ID: [id number] RxBin: [id number] PCN: [id number] Patient [place] Phone [phone] Fax [phone] 08/21/2017 9:41 AM

## 2017-08-22 ENCOUNTER — Other Ambulatory Visit: Payer: Self-pay

## 2017-08-23 ENCOUNTER — Telehealth: Payer: Self-pay | Admitting: Oncology

## 2017-08-23 ENCOUNTER — Other Ambulatory Visit: Payer: Self-pay | Admitting: *Deleted

## 2017-08-23 DIAGNOSIS — C50912 Malignant neoplasm of unspecified site of left female breast: Secondary | ICD-10-CM

## 2017-08-23 DIAGNOSIS — Z170 Estrogen receptor positive status [ER+]: Principal | ICD-10-CM

## 2017-08-23 NOTE — Telephone Encounter (Signed)
Labs in 2 weeks on 09/07/17. Labs + MD in 4 weeks on 09/21/17, per Sherry/schd msg. (Started [place] on 08/23/17) Appt conf w [name]/Caretaker.  Appts schd, conf and mailed. date per [name] request.

## 2017-08-24 NOTE — Telephone Encounter (Signed)
Oral Chemotherapy Pharmacist Encounter   [name] called my yesterday to inform me that Ms. Nadal took her first dose of Ibrance. Shared this information with [name], RN and Dr. [name]. Made patient calendar and placed calendar in the mail.  [name], PharmD, BCPS, [facility] Hematology/Oncology Clinical Pharmacist [facility] [place] [facility] [phone]  08/24/2017 8:45 AM

## 2017-08-29 ENCOUNTER — Telehealth: Payer: Self-pay | Admitting: Pharmacist

## 2017-08-29 MED ORDER — PROCHLORPERAZINE MALEATE 10 MG PO TABS: 10.0000 mg | ORAL_TABLET | Freq: Three times a day (TID) | ORAL | 0 refills | Status: DC | PRN

## 2017-08-29 NOTE — Telephone Encounter (Signed)
Called Ms. Tyree and let her know that Dr. [name] suggested compazine every 8 hours as needed and I sent it to [facility] which is the pharmacy they use. I told her that if it does not work to let us know an call back.

## 2017-08-29 NOTE — Addendum Note (Signed)
Addended by: Luella Cook on: 08/29/2017 02:20 PM   Modules accepted: Orders

## 2017-08-29 NOTE — Telephone Encounter (Signed)
Can you please call caregiver and start her on prn compazine 10 mg q8 prn for nausea? Thanks, Astrid Divine

## 2017-08-29 NOTE — Telephone Encounter (Signed)
Oral Chemotherapy Pharmacist Encounter   Received a call from [name] stating that since starting Kathy Mckay has been reporting nausea and not wanted to really wanting to eat. She does not currently have a medication in her med list for nausea.  Spoke with Dr. [name] and she will ask [name], RN to send in an anti-nausea medication for Kathy Mckay.  [name], PharmD, BCPS, [facility] Hematology/Oncology Clinical Pharmacist [facility] [place] [facility] [phone]  08/29/2017 12:49 PM

## 2017-09-05 ENCOUNTER — Other Ambulatory Visit: Payer: Self-pay

## 2017-09-05 ENCOUNTER — Ambulatory Visit: Payer: Self-pay | Admitting: Oncology

## 2017-09-07 ENCOUNTER — Inpatient Hospital Stay: Payer: Medicare Other

## 2017-09-07 DIAGNOSIS — M810 Age-related osteoporosis without current pathological fracture: Secondary | ICD-10-CM | POA: Diagnosis not present

## 2017-09-07 DIAGNOSIS — Z170 Estrogen receptor positive status [ER+]: Principal | ICD-10-CM

## 2017-09-07 DIAGNOSIS — I10 Essential (primary) hypertension: Secondary | ICD-10-CM | POA: Diagnosis not present

## 2017-09-07 DIAGNOSIS — Z79899 Other long term (current) drug therapy: Secondary | ICD-10-CM | POA: Diagnosis not present

## 2017-09-07 DIAGNOSIS — Z79811 Long term (current) use of aromatase inhibitors: Secondary | ICD-10-CM | POA: Diagnosis not present

## 2017-09-07 DIAGNOSIS — C50112 Malignant neoplasm of central portion of left female breast: Secondary | ICD-10-CM | POA: Diagnosis not present

## 2017-09-07 DIAGNOSIS — C7800 Secondary malignant neoplasm of unspecified lung: Secondary | ICD-10-CM | POA: Diagnosis not present

## 2017-09-07 DIAGNOSIS — C50912 Malignant neoplasm of unspecified site of left female breast: Secondary | ICD-10-CM

## 2017-09-07 LAB — COMPREHENSIVE METABOLIC PANEL
ALK PHOS: 94 U/L (ref 38–126)
ALT: 28 U/L (ref 0–44)
ANION GAP: 8 (ref 5–15)
AST: 33 U/L (ref 15–41)
Albumin: 4.2 g/dL (ref 3.5–5.0)
BUN: 28 mg/dL — ABNORMAL HIGH (ref 8–23)
CALCIUM: 9.3 mg/dL (ref 8.9–10.3)
CO2: 23 mmol/L (ref 22–32)
Chloride: 106 mmol/L (ref 98–111)
Creatinine, Ser: 0.95 mg/dL (ref 0.44–1.00)
GFR calc non Af Amer: 54 mL/min — ABNORMAL LOW (ref >60)
Glucose, Bld: 175 mg/dL — ABNORMAL HIGH (ref 70–99)
POTASSIUM: 4.1 mmol/L (ref 3.5–5.1)
Sodium: 137 mmol/L (ref 135–145)
TOTAL PROTEIN: 7.3 g/dL (ref 6.5–8.1)
Total Bilirubin: 1 mg/dL (ref 0.3–1.2)

## 2017-09-07 LAB — CBC WITH DIFFERENTIAL/PLATELET
BASOS ABS: 0 10*3/uL (ref 0–0.1)
Basophils Relative: 1 %
Eosinophils Absolute: 0 10*3/uL (ref 0–0.7)
Eosinophils Relative: 0 %
HEMATOCRIT: 46 % (ref 35.0–47.0)
HEMOGLOBIN: 15.6 g/dL (ref 12.0–16.0)
Lymphocytes Relative: 14 %
Lymphs Abs: 0.4 10*3/uL — ABNORMAL LOW (ref 1.0–3.6)
MCH: 33.9 pg (ref 26.0–34.0)
MCHC: 33.9 g/dL (ref 32.0–36.0)
MCV: 99.9 fL (ref 80.0–100.0)
MONOS PCT: 6 %
Monocytes Absolute: 0.2 10*3/uL (ref 0.2–0.9)
NEUTROS ABS: 2.3 10*3/uL (ref 1.4–6.5)
NEUTROS PCT: 79 %
Platelets: 99 10*3/uL — ABNORMAL LOW (ref 150–440)
RBC: 4.61 MIL/uL (ref 3.80–5.20)
RDW: 13.6 % (ref 11.5–14.5)
WBC: 2.9 10*3/uL — ABNORMAL LOW (ref 3.6–11.0)

## 2017-09-12 ENCOUNTER — Other Ambulatory Visit: Payer: Self-pay | Admitting: Family

## 2017-09-14 MED FILL — IBRANCE 100 MG CAPSULE: 100 | 28 days supply | Qty: 21 | Fill #1

## 2017-09-14 NOTE — Telephone Encounter (Signed)
Last office visit 04/07/17 No office visit scheduled

## 2017-09-15 ENCOUNTER — Telehealth: Payer: Self-pay | Admitting: Family

## 2017-09-15 NOTE — Telephone Encounter (Signed)
Kathy Mckay is calling back and declining to schedule appt at this time. Thanks you for her medication refills. She said pt is sick from chemo and having labs at the cancer center every 2 weeks.

## 2017-09-15 NOTE — Telephone Encounter (Signed)
FYI

## 2017-09-15 NOTE — Telephone Encounter (Signed)
Call pt  I know she has a lot going on with breast cancer right now however she is due for follow up with [place]  I would like to see how she is doing  Please encourage caregiver to make f/u appt for her. She is due for fasting labs so advise to come for MORNING appt and have her fast

## 2017-09-18 ENCOUNTER — Other Ambulatory Visit: Payer: Self-pay | Admitting: Oncology

## 2017-09-18 ENCOUNTER — Telehealth: Payer: Self-pay | Admitting: *Deleted

## 2017-09-18 NOTE — Telephone Encounter (Signed)
/  I had called last week and left Kathy Mckay a message to call  me back to r/s pt appt because of her irance would need to start back on 9/11 if her labs are ok and we wanted to see her a few days before 9/11 to make sure labs are good and have chance to get Ibrance reordered for her. She did not call back. I called again to day and got Kathy Mckay on the phone and she did not call me back because she already had the appt this week.. They already have the next dose of medicine. She states that the patient complains of nausea and she is giving the patient nausea med 2-3 times a day and her appetite has not been good because of the nausea. Yest. She started complaining of back pain so Kathy Mckay has made her an appt to see her PCP about back pain. I told Kathy Mckay at this point we will just see pt on Thursday of this week and if pt needs to cont. The ibrance then she can start it on this Thursday after we see her in the clinic. Kathy Mckay is agreeable and will not start the irance until she has MD visit this week.

## 2017-09-19 ENCOUNTER — Encounter: Payer: Self-pay | Admitting: Family Medicine

## 2017-09-19 ENCOUNTER — Ambulatory Visit (INDEPENDENT_AMBULATORY_CARE_PROVIDER_SITE_OTHER): Payer: Medicare Other | Admitting: Family Medicine

## 2017-09-19 VITALS — BP 128/78 | HR 101 | Temp 98.5°F | Ht <= 58 in | Wt 99.2 lb

## 2017-09-19 DIAGNOSIS — M40203 Unspecified kyphosis, cervicothoracic region: Secondary | ICD-10-CM | POA: Diagnosis not present

## 2017-09-19 DIAGNOSIS — C50912 Malignant neoplasm of unspecified site of left female breast: Secondary | ICD-10-CM

## 2017-09-19 DIAGNOSIS — M545 Low back pain, unspecified: Secondary | ICD-10-CM

## 2017-09-19 MED ORDER — MENTHOL (TOPICAL ANALGESIC) 4 % EX GEL: CUTANEOUS | 2 refills | Status: DC

## 2017-09-19 MED ORDER — HYDROCODONE-ACETAMINOPHEN 10-325 MG PO TABS: 1.0000 | ORAL_TABLET | Freq: Four times a day (QID) | ORAL | 0 refills | Status: AC | PRN

## 2017-09-19 NOTE — Progress Notes (Signed)
Subjective:    Patient ID: Kathy Mckay, female    DOB: 01-Jun-1932, 84 y.o.   MRN: [id number]  HPI   Patient presents to clinic with caregiver complaining of back pain for 4 to 5 days.  Describes pain as an aching, has been using Tylenol but has not had much relief with this medication.  Caregiver also states patient fell out of bed and landed on bottom 2 days ago, she did not hit head or lose consciousness.  Patient also was recently diagnosed with cancer - Diagnosis is: Stage IV invasive mammary carcinoma of the left breast cT4b cN1 M1 with lung metastases ER PR positive HER-2/neu negative  She has been started on oral chemo tamoxifen. I reviewed oncology note and it is also noted she has scattered bilateral pulmonary nodules with appearance that is highly concerning for malignancy.  However the tumor board discussed her case and given her age, developmental disorder and significant kyphosis they have decided it would not be safe to do a CT-guided biopsy of the lung nodules.  Patient Active Problem List   Diagnosis Date Noted  . Malignant neoplasm of left breast in female, estrogen receptor positive ([place]) 07/28/2017  . Dysuria 08/03/2016  . Tachycardia 08/03/2016  . Elevated alkaline phosphatase level 04/27/2016  . Gastroesophageal reflux disease 04/27/2016  . Screening for breast cancer 10/28/2015  . Medicare annual wellness visit, subsequent 03/05/2014  . Other and unspecified hyperlipidemia 11/19/2012  . Urinary incontinence 03/27/2012  . Osteoarthritis 12/26/2011  . Osteoporosis 03/15/2011  . Depression 03/01/2011  . Pernicious anemia 10/20/2010  . Hypertension 10/20/2010  . Hypothyroidism 10/20/2010  . Gait disturbance 10/20/2010  . Developmental delay 10/20/2010   Social History   Tobacco Use  . Smoking status: Never Smoker  . Smokeless tobacco: Never Used  Substance Use Topics  . Alcohol use: No    Review of Systems  Unable to perform ROS: Psychiatric disorder    Caregiver states patients only complaint is low back pain.      Objective:   Physical Exam  Constitutional: She is oriented to person, place, and time. No distress.  Frail, elderly woman in wheelchair  HENT:  Head: Normocephalic and atraumatic.  Eyes: No scleral icterus.  Neck: Normal range of motion. Neck supple.  Cardiovascular: Regular rhythm and normal heart sounds.  Pulmonary/Chest: Effort normal and breath sounds normal. No respiratory distress.  Musculoskeletal: She exhibits tenderness.  Midline lumbar spine pain. Severe kyphosis.  Neurological: She is alert and oriented to person, place, and time.  Oriented to her baseline  Skin: Skin is warm and dry. No pallor.  Psychiatric: She has a normal mood and affect.   Vitals:   09/19/17 1331  BP: 128/78  Pulse: (!) 101  Temp: 98.5 F (36.9 C)  SpO2: 92%      Assessment & Plan:   Low back pain -- patient's caregiver would like patient to get a back x-ray.  X-ray of lumbar spine ordered.  Prescription for Norco as needed for moderate to severe pain sent to pharmacy.  Patient may continue to use plain Tylenol as needed for mild-to-moderate pain.  Advised caregiver no more than 3000 mg total of Tylenol-containing medications in a 24-hour period.  Also sent over prescription for topical Biofreeze rub as this medication can be quite effective in calming any joint/back pains.    Breast Cancer -- I wonder if pain could also be partially related to breast cancer; patient does have lung metastasis.   Patient will  keep regular follow-up both here and with oncology as scheduled.  Advised to return to clinic at any time if issues arise.  Due to just started on oral chemotherapy, I recommended caregiver reach out to oncology to be sure about getting flu vaccination.

## 2017-09-20 ENCOUNTER — Encounter: Payer: Self-pay | Admitting: Family Medicine

## 2017-09-20 DIAGNOSIS — M40203 Unspecified kyphosis, cervicothoracic region: Secondary | ICD-10-CM | POA: Insufficient documentation

## 2017-09-21 ENCOUNTER — Encounter: Payer: Self-pay | Admitting: Oncology

## 2017-09-21 ENCOUNTER — Ambulatory Visit
Admission: RE | Admit: 2017-09-21 | Discharge: 2017-09-21 | Disposition: A | Discharge status: Home / Self Care | Payer: Medicare Other | Source: Ambulatory Visit | Attending: Family Medicine | Admitting: Family Medicine

## 2017-09-21 ENCOUNTER — Inpatient Hospital Stay: Payer: Medicare Other

## 2017-09-21 ENCOUNTER — Inpatient Hospital Stay: Payer: Medicare Other | Attending: Oncology | Admitting: Oncology

## 2017-09-21 VITALS — BP 136/77 | HR 87 | Temp 97.9°F | Resp 18 | Ht <= 58 in | Wt 97.2 lb

## 2017-09-21 DIAGNOSIS — Z170 Estrogen receptor positive status [ER+]: Secondary | ICD-10-CM | POA: Insufficient documentation

## 2017-09-21 DIAGNOSIS — C50012 Malignant neoplasm of nipple and areola, left female breast: Secondary | ICD-10-CM | POA: Insufficient documentation

## 2017-09-21 DIAGNOSIS — C50912 Malignant neoplasm of unspecified site of left female breast: Secondary | ICD-10-CM

## 2017-09-21 DIAGNOSIS — M4854XA Collapsed vertebra, not elsewhere classified, thoracic region, initial encounter for fracture: Secondary | ICD-10-CM | POA: Insufficient documentation

## 2017-09-21 DIAGNOSIS — Z79810 Long term (current) use of selective estrogen receptor modulators (SERMs): Secondary | ICD-10-CM | POA: Diagnosis not present

## 2017-09-21 DIAGNOSIS — M8588 Other specified disorders of bone density and structure, other site: Secondary | ICD-10-CM | POA: Diagnosis not present

## 2017-09-21 DIAGNOSIS — R29890 Loss of height: Secondary | ICD-10-CM | POA: Diagnosis not present

## 2017-09-21 DIAGNOSIS — M545 Low back pain, unspecified: Secondary | ICD-10-CM

## 2017-09-21 DIAGNOSIS — C7802 Secondary malignant neoplasm of left lung: Secondary | ICD-10-CM

## 2017-09-21 DIAGNOSIS — C7801 Secondary malignant neoplasm of right lung: Secondary | ICD-10-CM | POA: Insufficient documentation

## 2017-09-21 DIAGNOSIS — Z79899 Other long term (current) drug therapy: Secondary | ICD-10-CM

## 2017-09-21 DIAGNOSIS — M8580 Other specified disorders of bone density and structure, unspecified site: Secondary | ICD-10-CM | POA: Diagnosis not present

## 2017-09-21 DIAGNOSIS — C773 Secondary and unspecified malignant neoplasm of axilla and upper limb lymph nodes: Secondary | ICD-10-CM | POA: Diagnosis not present

## 2017-09-21 LAB — COMPREHENSIVE METABOLIC PANEL
ALT: 34 U/L (ref 0–44)
AST: 43 U/L — AB (ref 15–41)
Albumin: 3.8 g/dL (ref 3.5–5.0)
Alkaline Phosphatase: 86 U/L (ref 38–126)
Anion gap: 10 (ref 5–15)
BUN: 22 mg/dL (ref 8–23)
CHLORIDE: 100 mmol/L (ref 98–111)
CO2: 24 mmol/L (ref 22–32)
Calcium: 9.2 mg/dL (ref 8.9–10.3)
Creatinine, Ser: 0.82 mg/dL (ref 0.44–1.00)
GFR calc non Af Amer: >60 mL/min (ref >60)
Glucose, Bld: 201 mg/dL — ABNORMAL HIGH (ref 70–99)
POTASSIUM: 3.5 mmol/L (ref 3.5–5.1)
SODIUM: 134 mmol/L — AB (ref 135–145)
Total Bilirubin: 1.1 mg/dL (ref 0.3–1.2)
Total Protein: 7.4 g/dL (ref 6.5–8.1)

## 2017-09-21 LAB — CBC WITH DIFFERENTIAL/PLATELET
Basophils Absolute: 0 10*3/uL (ref 0–0.1)
Basophils Relative: 1 %
Eosinophils Absolute: 0 10*3/uL (ref 0–0.7)
Eosinophils Relative: 0 %
HCT: 43.6 % (ref 35.0–47.0)
HEMOGLOBIN: 14.9 g/dL (ref 12.0–16.0)
LYMPHS ABS: 0.4 10*3/uL — AB (ref 1.0–3.6)
LYMPHS PCT: 9 %
MCH: 34 pg (ref 26.0–34.0)
MCHC: 34.3 g/dL (ref 32.0–36.0)
MCV: 99.3 fL (ref 80.0–100.0)
Monocytes Absolute: 0.6 10*3/uL (ref 0.2–0.9)
Monocytes Relative: 13 %
NEUTROS PCT: 77 %
Neutro Abs: 3.4 10*3/uL (ref 1.4–6.5)
Platelets: 241 10*3/uL (ref 150–440)
RBC: 4.39 MIL/uL (ref 3.80–5.20)
RDW: 14.5 % (ref 11.5–14.5)
WBC: 4.4 10*3/uL (ref 3.6–11.0)

## 2017-09-22 ENCOUNTER — Telehealth: Payer: Self-pay | Admitting: Family

## 2017-09-22 NOTE — Telephone Encounter (Signed)
close

## 2017-09-22 NOTE — Progress Notes (Signed)
Hematology/Oncology Consult note [name]  Telephone:([phone] Fax:([phone]  Patient Care Team: Burnard Hawthorne, FNP as PCP - General (Family Medicine)   Name of the patient: Kathy Mckay  [id number]  1932-05-18   Date of visit: 09/22/17  Diagnosis- stage IV invasive mammary carcinoma of the left breast cT4b cN1 M1 with lung metastases ER PR positive HER-2/neu negative  Chief complaint/ Reason for visit- assess tolerance to ibrance and tamoxifen  Heme/Onc history:  patient is a 84 year old female with developmental disorder. She is a ward of the state and lives in a group home. She is here with her caregiver Ms. Tyree. She has been at the group home for the last 4 years. She does not have any living family other than a distant cousin. Her assigned social worker Ms.Jackson is her healthcare proxy. Her past medical history is significant forhypertension hyperlipidemia and osteoporosis among other medical problems.Her caregiver states that overall this patient is a happy-go-lucky person and has been doing well at her nursing home. She has not had any recent falls or hospitalizations. She recently underwent a screening mammogram which picked up a mass in her left breast. Patient has not had a prior mammogram before this for years.  Mammogram and ultrasound showed 2.9 x 2.8 x 3 cm mass in the left retroareolar region. There is extension into the skin and nipple areole or complex. Associated skin thickening. At least 2 abnormal appearing left axillary lymph nodes with cortical thickening up to 1 cm.  Core biopsy of the breast mass and the axillary lymph node revealed invasive mammary carcinoma, grade 2 ER Greater than 90% and PR11-50% positive and HER-2/neu negative.Extracapsular extension was noted in the lymph node biopsy.  Patient herself is unable to understand her ongoing medical issues she denies any complaints today. Caregiver  reports that her appetite is good.And she has not had any unintentional weight loss  CT chest abdomen and pelvis showed left breast mass, suspicious left axillary and internal mammary adenopathy along with bilateral pulmonary nodules measuring up to 16 mm in diameter consistent with metastatic disease.  No evidence of bone metastases on bone scan  Interval history-patient is here with her caregiver Ms. Tyree today.  Caregiver reports that ever since [place] and tamoxifen is been started patient has not had a good appetite.  Before starting the treatment she would finish up her plate of food without any difficulty..  Since starting treatment patient barely finishes her food and the plate.  At times she complains of nausea.  She has been getting Compazine twice a day but that has not improved her appetite.  Over the last 1 week she has also been complaining of more back pain and was seen by her primary care doctor.  She was started on pain medications which has not helped  ECOG PS- 2 Pain scale- unable to assess Opioid associated constipation- no  Review of systems- Review of Systems  Unable to perform ROS: Psychiatric disorder    Patient is inconsistent in reporting her symptoms  No Known Allergies   Past Medical History:  Diagnosis Date  . Developmental delay   . GERD (gastroesophageal reflux disease)   . Hyperlipidemia   . Hypertension   . Osteoporosis      Past Surgical History:  Procedure Laterality Date  . ABDOMINAL HYSTERECTOMY    . BREAST BIOPSY Left 07/19/2017   invasive mammary carcinoma  . BREAST BIOPSY Left 07/19/2017   invasive mammary carcinoma  .  SHOULDER SURGERY      Social History   Socioeconomic History  . Marital status: Single    Spouse name: Not on file  . Number of children: Not on file  . Years of education: Not on file  . Highest education level: Not on file  Occupational History  . Not on file  Social Needs  . Financial resource strain: Not  on file  . Food insecurity:    Worry: Not on file    Inability: Not on file  . Transportation needs:    Medical: Not on file    Non-medical: Not on file  Tobacco Use  . Smoking status: Never Smoker  . Smokeless tobacco: Never Used  Substance and Sexual Activity  . Alcohol use: No  . Drug use: No  . Sexual activity: Never  Lifestyle  . Physical activity:    Days per week: Not on file    Minutes per session: Not on file  . Stress: Not on file  Relationships  . Social connections:    Talks on phone: Not on file    Gets together: Not on file    Attends religious service: Not on file    Active member of club or organization: Not on file    Attends meetings of clubs or organizations: Not on file    Relationship status: Not on file  . Intimate partner violence:    Fear of current or ex partner: Not on file    Emotionally abused: Not on file    Physically abused: Not on file    Forced sexual activity: Not on file  Other Topics Concern  . Not on file  Social History Narrative   Lives at a family care home. No living relatives. Guardian of the state.      Lives [place] [facility] for past 6 years.     Family History  Problem Relation Age of Onset  . Diabetes Mother   . Diabetes Father   . Cancer Sister      Current Outpatient Medications:  .  amLODipine (NORVASC) 10 MG tablet, TAKE 1 TABLET BY MOUTH DAILY, Disp: 90 tablet, Rfl: 1 .  atorvastatin (LIPITOR) 40 MG tablet, TAKE 1 TABLET BY MOUTH ONCE DAILY, Disp: 90 tablet, Rfl: 1 .  esomeprazole (NEXIUM) 40 MG capsule, TAKE 1 CAPSULE BY MOUTH ONCE DAILY AT 8 AM, Disp: 90 capsule, Rfl: 0 .  mirtazapine (REMERON) 45 MG tablet, TAKE 1 TABLET BY MOUTH AT BEDTIME, Disp: 30 tablet, Rfl: 1 .  palbociclib (IBRANCE) 100 MG capsule, Take 1 capsule (100 mg total) by mouth daily with breakfast. Take for 21 days on, 7 days off, repeat every 28 days., Disp: 21 capsule, Rfl: 6 .  tamoxifen (NOLVADEX) 20 MG tablet, Take 1 tablet (20 mg total)  by mouth daily., Disp: 30 tablet, Rfl: 3 .  HYDROcodone-acetaminophen (NORCO) 10-325 MG tablet, Take 1 tablet by mouth every 6 (six) hours as needed for up to 5 days (pain). Max of 3041m of acetaminophen in 24 hours (Patient not taking: Reported on 09/21/2017), Disp: 20 tablet, Rfl: 0 .  Menthol, Topical Analgesic, (BIOFREEZE) 4 % GEL, 1 application to painful area every 4 hours as needed (Patient not taking: Reported on 09/21/2017), Disp: 89 mL, Rfl: 2 .  prochlorperazine (COMPAZINE) 10 MG tablet, TAKE 1 TABLET BY MOUTH EVERY 8 HOURS AS NEEDED FOR NAUSEA OR VOMITING. (Patient not taking: Reported on 09/21/2017), Disp: 30 tablet, Rfl: 0  Physical exam:  Vitals:  09/21/17 0959  BP: 136/77  Pulse: 87  Resp: 18  Temp: 97.9 F (36.6 C)  TempSrc: Tympanic  SpO2: 97%  Weight: 97 lb 3.2 oz (44.1 kg)  Height: _0  (1.321 m)   Physical Exam  Constitutional:  Thin frail elderly woman sitting in a wheelchair.  She appears in no acute distress  HENT:  Head: Normocephalic and atraumatic.  Eyes: Pupils are equal, round, and reactive to light. EOM are normal.  Neck: Normal range of motion.  Cardiovascular: Normal rate, regular rhythm and normal heart sounds.  Pulmonary/Chest: Effort normal and breath sounds normal.  Abdominal: Soft. Bowel sounds are normal.  Neurological: She is alert.  Oriented to self  Skin: Skin is warm and dry.  Forearm mass noted in the left breast with satellite nodules.  No significant improvement or worsening noted thus far  CMP Latest Ref Rng & Units 09/21/2017  Glucose 70 - 99 mg/dL 201(H)  BUN 8 - 23 mg/dL 22  Creatinine 0.44 - 1.00 mg/dL 0.82  Sodium 135 - 145 mmol/L 134(L)  Potassium 3.5 - 5.1 mmol/L 3.5  Chloride 98 - 111 mmol/L 100  CO2 22 - 32 mmol/L 24  Calcium 8.9 - 10.3 mg/dL 9.2  Total Protein 6.5 - 8.1 g/dL 7.4  Total Bilirubin 0.3 - 1.2 mg/dL 1.1  Alkaline Phos 38 - 126 U/L 86  AST 15 - 41 U/L 43(H)  ALT 0 - 44 U/L 34   CBC Latest Ref Rng & Units  09/21/2017  WBC 3.6 - 11.0 K/uL 4.4  Hemoglobin 12.0 - 16.0 g/dL 14.9  Hematocrit 35.0 - 47.0 % 43.6  Platelets 150 - 440 K/uL 241    No images are attached to the encounter.  Dg Lumbar Spine Complete  Result Date: 09/21/2017 CLINICAL DATA:  Low back pain for 2 weeks.  No known injury. EXAM: LUMBAR SPINE - COMPLETE 4+ VIEW COMPARISON:  CT chest, abdomen and pelvis and whole-body bone scan 08/04/2017. FINDINGS: Since the prior CT scan, the patient has suffered a compression fracture of T11 with vertebral body height loss of approximately 80%. Mild compression fracture of T10 with approximately 30% vertebral body height loss is present on the prior CT. There appears to be only minimal bony retropulsion. No lumbar spine fracture is identified. Bones are osteopenic. Intervertebral disc space height is maintained. No focal bony lesion. Large colonic stool burden noted. IMPRESSION: T11 compression fracture with approximately 80% vertebral body height loss is new since 08/04/2017 consistent with acute or subacute injury. Remote T10 compression fracture. Negative for lumbar spine fracture. Osteopenia. Large colonic stool burden. Electronically Signed   By: Inge Rise M.D.   On: 09/21/2017 16:15     Assessment and plan- Patient is a 84 y.o. female with newly diagnosed invasive mammary carcinoma of the left breast stage IV cT4b cN1 M1 ER PR positive HER-2/neu negative with lung metastases. She is here to assess tolerance to ibrance/ tamoxifen  Given ongoing symptoms of nausea and loss of appetite as well as underlying developmental disorder which makes it difficult to elicit history in patient's case, I have asked the caregiver to hold Ibrance at this time.  She did finish 1 cycle of Ibrance 3 weeks on and one-week off and was supposed to start cycle 2 tomorrow.  She will continue tamoxifen at this time.  We will give her a call in 2 weeks time to see how she is doing and if her nausea and appetite is  better after holding Ibrance.  Her CBC and CMP is otherwise okay to continue treatment.  I will see her back in 4 weeks with CBC and CMP.  If her symptoms continue I may have to hold Ibrance for longer and just continue tamoxifen at this time.  Clinically her breast mass is unchanged and there is no skin breakdown. I would favor holding off on surgery at this time     Visit Diagnosis 1. Malignant neoplasm of left breast in female, estrogen receptor positive, unspecified site of breast ([place])   2. High risk medication use      Dr. Randa Evens, MD, MPH [facility] at [facility] [phone] 09/22/2017 9:06 AM

## 2017-09-26 DIAGNOSIS — M79675 Pain in left toe(s): Secondary | ICD-10-CM | POA: Diagnosis not present

## 2017-09-26 DIAGNOSIS — B351 Tinea unguium: Secondary | ICD-10-CM | POA: Diagnosis not present

## 2017-09-26 DIAGNOSIS — M79674 Pain in right toe(s): Secondary | ICD-10-CM | POA: Diagnosis not present

## 2017-09-30 ENCOUNTER — Other Ambulatory Visit: Payer: Self-pay | Admitting: Family Medicine

## 2017-10-03 NOTE — Telephone Encounter (Signed)
Last filled 09/19/17 Home is requesting another refill for patient  Last office visit with Ander Purpura, NP 09/19/17 acute

## 2017-10-04 ENCOUNTER — Telehealth: Payer: Self-pay | Admitting: Pharmacist

## 2017-10-04 ENCOUNTER — Telehealth: Payer: Self-pay | Admitting: *Deleted

## 2017-10-04 ENCOUNTER — Other Ambulatory Visit: Payer: Self-pay | Admitting: *Deleted

## 2017-10-04 MED ORDER — PALBOCICLIB 75 MG PO CAPS: 75.0000 mg | ORAL_CAPSULE | Freq: Every day | ORAL | 1 refills | Status: DC

## 2017-10-04 NOTE — Telephone Encounter (Signed)
Called [name] at [facility] and let her know that dr. [name] wants pt to start back on ibrance but at a reduced dose. The tablet will 75 mg daily x 3 weeks with 1 week off. I have sent message to alyson and asked her to get in touch with you about delivery of new dose. [name] is agreeable to this

## 2017-10-04 NOTE — Telephone Encounter (Signed)
Oral Chemotherapy Pharmacist Encounter   I received a call from [name], caregiver for [facility]. She called to let the office know that Kathy Mckay is back to eating her normal diet and she is no longer nauseated.   [name] wanted to know if she should restart the [facility] for [name] tomorrow 10/05/17?   [name], PharmD, BCPS, [facility] Hematology/Oncology Clinical Pharmacist [facility] Oral [facility] [phone]  10/04/2017 10:24 AM

## 2017-10-04 NOTE — Telephone Encounter (Signed)
Dr. [name] said to start the Ibrance back but at dose 75 mg. I will enter the new rx.. I left message for Alyson to get new dose out to pt.

## 2017-10-05 MED FILL — IBRANCE 75 MG CAPSULE: 75 | 28 days supply | Qty: 21 | Fill #0

## 2017-10-12 ENCOUNTER — Other Ambulatory Visit: Payer: Self-pay | Admitting: Family

## 2017-10-12 DIAGNOSIS — K219 Gastro-esophageal reflux disease without esophagitis: Secondary | ICD-10-CM

## 2017-10-13 ENCOUNTER — Encounter: Payer: Self-pay | Admitting: Family

## 2017-10-13 NOTE — Telephone Encounter (Signed)
Call caregiver  Can I send in nexium 20mg  qd prn as opposed to 40mg  QD?  That is a high dose  Does she need every for symptoms?

## 2017-10-13 NOTE — Telephone Encounter (Signed)
Last office visit 09/19/17 Last filled 09/12/17 Needs refill for next month

## 2017-10-20 ENCOUNTER — Inpatient Hospital Stay: Payer: Medicare Other | Admitting: Oncology

## 2017-10-20 ENCOUNTER — Inpatient Hospital Stay: Payer: Medicare Other

## 2017-10-27 ENCOUNTER — Inpatient Hospital Stay (HOSPITAL_BASED_OUTPATIENT_CLINIC_OR_DEPARTMENT_OTHER): Payer: Medicare Other | Admitting: Oncology

## 2017-10-27 ENCOUNTER — Other Ambulatory Visit: Payer: Self-pay

## 2017-10-27 ENCOUNTER — Encounter: Payer: Self-pay | Admitting: Oncology

## 2017-10-27 ENCOUNTER — Inpatient Hospital Stay: Payer: Medicare Other | Attending: Oncology

## 2017-10-27 VITALS — BP 126/84 | HR 93 | Temp 95.5°F | Resp 18 | Ht <= 58 in | Wt 90.3 lb

## 2017-10-27 DIAGNOSIS — Z170 Estrogen receptor positive status [ER+]: Secondary | ICD-10-CM | POA: Diagnosis not present

## 2017-10-27 DIAGNOSIS — C7800 Secondary malignant neoplasm of unspecified lung: Secondary | ICD-10-CM

## 2017-10-27 DIAGNOSIS — E785 Hyperlipidemia, unspecified: Secondary | ICD-10-CM

## 2017-10-27 DIAGNOSIS — D7589 Other specified diseases of blood and blood-forming organs: Secondary | ICD-10-CM | POA: Diagnosis not present

## 2017-10-27 DIAGNOSIS — I10 Essential (primary) hypertension: Secondary | ICD-10-CM

## 2017-10-27 DIAGNOSIS — D702 Other drug-induced agranulocytosis: Secondary | ICD-10-CM

## 2017-10-27 DIAGNOSIS — D696 Thrombocytopenia, unspecified: Secondary | ICD-10-CM | POA: Diagnosis not present

## 2017-10-27 DIAGNOSIS — Z79899 Other long term (current) drug therapy: Secondary | ICD-10-CM

## 2017-10-27 DIAGNOSIS — D6959 Other secondary thrombocytopenia: Secondary | ICD-10-CM

## 2017-10-27 DIAGNOSIS — C50912 Malignant neoplasm of unspecified site of left female breast: Secondary | ICD-10-CM

## 2017-10-27 DIAGNOSIS — C50112 Malignant neoplasm of central portion of left female breast: Secondary | ICD-10-CM | POA: Insufficient documentation

## 2017-10-27 DIAGNOSIS — T50905A Adverse effect of unspecified drugs, medicaments and biological substances, initial encounter: Secondary | ICD-10-CM

## 2017-10-27 DIAGNOSIS — R918 Other nonspecific abnormal finding of lung field: Secondary | ICD-10-CM | POA: Insufficient documentation

## 2017-10-27 LAB — CBC WITH DIFFERENTIAL/PLATELET
ABS IMMATURE GRANULOCYTES: 0.01 10*3/uL (ref 0.00–0.07)
BASOS ABS: 0 10*3/uL (ref 0.0–0.1)
Basophils Relative: 1 %
EOS ABS: 0 10*3/uL (ref 0.0–0.5)
Eosinophils Relative: 1 %
HEMATOCRIT: 44.1 % (ref 36.0–46.0)
Hemoglobin: 14.9 g/dL (ref 12.0–15.0)
IMMATURE GRANULOCYTES: 0 %
LYMPHS ABS: 0.8 10*3/uL (ref 0.7–4.0)
Lymphocytes Relative: 30 %
MCH: 34.5 pg — ABNORMAL HIGH (ref 26.0–34.0)
MCHC: 33.8 g/dL (ref 30.0–36.0)
MCV: 102.1 fL — AB (ref 80.0–100.0)
MONOS PCT: 7 %
Monocytes Absolute: 0.2 10*3/uL (ref 0.1–1.0)
NEUTROS ABS: 1.6 10*3/uL — AB (ref 1.7–7.7)
NEUTROS PCT: 61 %
PLATELETS: 90 10*3/uL — AB (ref 150–400)
RBC: 4.32 MIL/uL (ref 3.87–5.11)
RDW: 16.5 % — AB (ref 11.5–15.5)
WBC: 2.6 10*3/uL — ABNORMAL LOW (ref 4.0–10.5)
nRBC: 0 % (ref 0.0–0.2)

## 2017-10-27 NOTE — Telephone Encounter (Signed)
Left voicemail for Kathy Mckay to call per DPR to call

## 2017-10-30 NOTE — Progress Notes (Signed)
Hematology/Oncology Consult note [facility]  Telephone:([phone] Fax:([phone]  Patient Care Team: Burnard Hawthorne, FNP as PCP - General (Family Medicine)   Name of the patient: Kathy Mckay  [id number]  04/29/1932   Date of visit: 10/30/17  Diagnosis-  stage IV invasive mammary carcinoma of the left breast cT4b cN1 M1 with lung metastases ER PR positive HER-2/neu negative   Chief complaint/ Reason for visit- routine f/u of breast cancer on ibrance and tamoxifen  Heme/Onc history: patient is a 84 year old female with developmental disorder. She is a ward of the state and lives in a group home. She is here with her caregiver Ms. Tyree. She has been at the group home for the last 4 years. She does not have any living family other than a distant cousin. Her assigned social worker Ms.Jackson is her healthcare proxy. Her past medical history is significant forhypertension hyperlipidemia and osteoporosis among other medical problems.Her caregiver states that overall this patient is a happy-go-lucky person and has been doing well at her nursing home. She has not had any recent falls or hospitalizations. She recently underwent a screening mammogram which picked up a mass in her left breast. Patient has not had a prior mammogram before this for years.  Mammogram and ultrasound showed 2.9 x 2.8 x 3 cm mass in the left retroareolar region. There is extension into the skin and nipple areole or complex. Associated skin thickening. At least 2 abnormal appearing left axillary lymph nodes with cortical thickening up to 1 cm.  Core biopsy of the breast mass and the axillary lymph node revealed invasive mammary carcinoma, grade 2 ER Greater than 90% and PR11-50% positive and HER-2/neu negative.Extracapsular extension was noted in the lymph node biopsy.  Patient herself is unable to understand her ongoing medical issues she denies any complaints today.  Caregiver reports that her appetite is good.And she has not had any unintentional weight loss  CT chest abdomen and pelvis showed left breast mass, suspicious left axillary and internal mammary adenopathy along with bilateral pulmonary nodules measuring up to 16 mm in diameter consistent with metastatic disease. No evidence of bone metastases on bone scan  Patient was started on ibrance 100 mg. She could not tolerate the dose due to refractory nausea and dose was reduced to 75 mg  Interval history- She is here with her caregiver. Patients states she feels well. Denies any nausea or pain. Caregiver states she has been eating all her food on her plate and has not complained of any specific symptoms. She is also taking tamoxifen daily  ECOG PS- 2-3 Pain scale- 0   Review of systems- Review of Systems  Constitutional: Negative for chills, fever, malaise/fatigue and weight loss.  HENT: Negative for congestion, ear discharge and nosebleeds.   Eyes: Negative for blurred vision.  Respiratory: Negative for cough, hemoptysis, sputum production, shortness of breath and wheezing.   Cardiovascular: Negative for chest pain, palpitations, orthopnea and claudication.  Gastrointestinal: Negative for abdominal pain, blood in stool, constipation, diarrhea, heartburn, melena, nausea and vomiting.  Genitourinary: Negative for dysuria, flank pain, frequency, hematuria and urgency.  Musculoskeletal: Negative for back pain, joint pain and myalgias.  Skin: Negative for rash.  Neurological: Negative for dizziness, tingling, focal weakness, seizures, weakness and headaches.  Endo/Heme/Allergies: Does not bruise/bleed easily.  Psychiatric/Behavioral: Negative for depression and suicidal ideas. The patient does not have insomnia.      No Known Allergies   Past Medical History:  Diagnosis Date  .  Developmental delay   . GERD (gastroesophageal reflux disease)   . Hyperlipidemia   . Hypertension   .  Osteoporosis      Past Surgical History:  Procedure Laterality Date  . ABDOMINAL HYSTERECTOMY    . BREAST BIOPSY Left 07/19/2017   invasive mammary carcinoma  . BREAST BIOPSY Left 07/19/2017   invasive mammary carcinoma  . SHOULDER SURGERY      Social History   Socioeconomic History  . Marital status: Single    Spouse name: Not on file  . Number of children: Not on file  . Years of education: Not on file  . Highest education level: Not on file  Occupational History  . Not on file  Social Needs  . Financial resource strain: Not on file  . Food insecurity:    Worry: Not on file    Inability: Not on file  . Transportation needs:    Medical: Not on file    Non-medical: Not on file  Tobacco Use  . Smoking status: Never Smoker  . Smokeless tobacco: Never Used  Substance and Sexual Activity  . Alcohol use: No  . Drug use: No  . Sexual activity: Never  Lifestyle  . Physical activity:    Days per week: Not on file    Minutes per session: Not on file  . Stress: Not on file  Relationships  . Social connections:    Talks on phone: Not on file    Gets together: Not on file    Attends religious service: Not on file    Active member of club or organization: Not on file    Attends meetings of clubs or organizations: Not on file    Relationship status: Not on file  . Intimate partner violence:    Fear of current or ex partner: Not on file    Emotionally abused: Not on file    Physically abused: Not on file    Forced sexual activity: Not on file  Other Topics Concern  . Not on file  Social History Narrative   Lives at a family care home. No living relatives. Guardian of the state.      Lives [facility] [facility] for past 6 years.     Family History  Problem Relation Age of Onset  . Diabetes Mother   . Diabetes Father   . Cancer Sister      Current Outpatient Medications:  .  amLODipine (NORVASC) 10 MG tablet, TAKE 1 TABLET BY MOUTH DAILY, Disp: 90 tablet, Rfl: 1 .   atorvastatin (LIPITOR) 40 MG tablet, TAKE 1 TABLET BY MOUTH ONCE DAILY, Disp: 90 tablet, Rfl: 1 .  esomeprazole (NEXIUM) 40 MG capsule, TAKE 1 CAPSULE BY MOUTH ONCE DAILY AT 8 AM, Disp: 90 capsule, Rfl: 0 .  HYDROcodone-acetaminophen (NORCO/VICODIN) 5-325 MG tablet, TAKE 1 TABLET BY MOUTH EVERY 6 HOURS AS NEEDED FOR UP TO 5 DAYS(PAIN). MAX OF 300MG OF ACETAMINOPHEN IN 24 HOURS, Disp: 30 tablet, Rfl: 0 .  mirtazapine (REMERON) 45 MG tablet, TAKE 1 TABLET BY MOUTH AT BEDTIME, Disp: 30 tablet, Rfl: 1 .  palbociclib (IBRANCE) 75 MG capsule, Take 1 capsule (75 mg total) by mouth daily with breakfast. Take whole with food. Take for 21 days on, 7 days off, repeat every 28 days., Disp: 21 capsule, Rfl: 1 .  tamoxifen (NOLVADEX) 20 MG tablet, Take 1 tablet (20 mg total) by mouth daily., Disp: 30 tablet, Rfl: 3 .  Menthol, Topical Analgesic, (BIOFREEZE) 4 % GEL, 1  application to painful area every 4 hours as needed (Patient not taking: Reported on 09/21/2017), Disp: 89 mL, Rfl: 2 .  prochlorperazine (COMPAZINE) 10 MG tablet, TAKE 1 TABLET BY MOUTH EVERY 8 HOURS AS NEEDED FOR NAUSEA OR VOMITING. (Patient not taking: Reported on 09/21/2017), Disp: 30 tablet, Rfl: 0  Physical exam:  Vitals:   10/27/17 0951  BP: 126/84  Pulse: 93  Resp: 18  Temp: (!) 95.5 F (35.3 C)  TempSrc: Tympanic  SpO2: 91%  Weight: 90 lb 4.8 oz (41 kg)  Height: _0  (1.321 m)   Physical Exam  Constitutional: She is oriented to person, place, and time.  Thin frail female in no acute distress  HENT:  Head: Normocephalic and atraumatic.  Eyes: Pupils are equal, round, and reactive to light. EOM are normal.  Neck: Normal range of motion.  Cardiovascular: Normal rate, regular rhythm and normal heart sounds.  Pulmonary/Chest: Effort normal and breath sounds normal.  Abdominal: Soft. Bowel sounds are normal.  Neurological: She is alert and oriented to person, place, and time.  Skin: Skin is warm and dry.  Left breast mass appears  somewhat smaller.  There is evidence of satellite lesions as before but no evidence of any open areas or ulcerations  CMP Latest Ref Rng & Units 09/21/2017  Glucose 70 - 99 mg/dL 201(H)  BUN 8 - 23 mg/dL 22  Creatinine 0.44 - 1.00 mg/dL 0.82  Sodium 135 - 145 mmol/L 134(L)  Potassium 3.5 - 5.1 mmol/L 3.5  Chloride 98 - 111 mmol/L 100  CO2 22 - 32 mmol/L 24  Calcium 8.9 - 10.3 mg/dL 9.2  Total Protein 6.5 - 8.1 g/dL 7.4  Total Bilirubin 0.3 - 1.2 mg/dL 1.1  Alkaline Phos 38 - 126 U/L 86  AST 15 - 41 U/L 43(H)  ALT 0 - 44 U/L 34   CBC Latest Ref Rng & Units 10/27/2017  WBC 4.0 - 10.5 K/uL 2.6(L)  Hemoglobin 12.0 - 15.0 g/dL 14.9  Hematocrit 36.0 - 46.0 % 44.1  Platelets 150 - 400 K/uL 90(L)    Assessment and plan- Patient is a 84 y.o. female with invasive mammary carcinoma of the left breast stage IV cT4b cN1 M1 ER PR positive HER-2/neu negative with lung metastases.  She is here for routine follow-up of breast cancer on Ibrance and tamoxifen  Overall patient is tolerating her Ibrance at 75 mg well without any significant side effects.  Her white count is 2.6 today but her ANC is 1.6 that is greater than 1.  She is not anemic but has evidence of macrocytosis likely secondary to [place].  Platelet counts are moderately low at 90.  This is going to be her week off and she will restart cycle 3 after 1 week.  Clinically her breast mass appears a little smaller and there is no evidence of skin breakdown or ulcerations.  Plan is to proceed with tamoxifen Ibrance as long as she is able to tolerate it.  Will not be getting any surgery at this time as she has evidence of lung metastases.  I will discuss with caregiver at her next visit regarding surveillance imaging  Patient will continue tamoxifen daily.  AI has not been used given her history of osteoporosis  I will see her back on 1121 with CBC and CMP.  She will get labs only on 11/ 7 CBC with differential CA-27-29 and CA-15-3 given her  neutropenia and thrombocytopenia   Visit Diagnosis 1. Malignant neoplasm of left breast  in female, estrogen receptor positive, unspecified site of breast ([place])   2. High risk medication use   3. Drug-induced neutropenia ([facility])   4. Drug-induced thrombocytopenia      Dr. Randa Evens, MD, MPH [facility] at [facility] [phone] 10/30/2017 10:05 AM

## 2017-10-31 MED FILL — IBRANCE 75 MG CAPSULE: 75 | 28 days supply | Qty: 21 | Fill #1

## 2017-11-02 ENCOUNTER — Telehealth: Payer: Self-pay | Admitting: Family

## 2017-11-02 NOTE — Telephone Encounter (Signed)
Copied from [place] [id number]. Topic: General - Inquiry >> Nov 02, 2017 10:00 AM Berneta Levins wrote: Reason for CRM:   [name] from [place] [facility] calling.  States that she sent over a fax for request for independent assessment for personal care services and she wants to know if that has been filled out and faxed back yet. [name] can be reached at [phone]

## 2017-11-03 ENCOUNTER — Telehealth: Payer: Self-pay | Admitting: Family

## 2017-11-03 DIAGNOSIS — Z0279 Encounter for issue of other medical certificate: Secondary | ICD-10-CM

## 2017-11-03 NOTE — Telephone Encounter (Signed)
Pt dropped off paper work that was requested by [employer]. Paper work is up front in [employer]

## 2017-11-03 NOTE — Telephone Encounter (Signed)
Spoke with Inetta Fermo and [facility] advised we didn't receive

## 2017-11-08 NOTE — Telephone Encounter (Signed)
Form completed and faxed to [facility]

## 2017-11-08 NOTE — Telephone Encounter (Signed)
Form completed and faxed to [facility]

## 2017-11-13 ENCOUNTER — Other Ambulatory Visit: Payer: Self-pay | Admitting: Oncology

## 2017-11-15 ENCOUNTER — Other Ambulatory Visit: Payer: Self-pay | Admitting: Family

## 2017-11-15 DIAGNOSIS — K219 Gastro-esophageal reflux disease without esophagitis: Secondary | ICD-10-CM

## 2017-11-15 MED ORDER — ESOMEPRAZOLE MAGNESIUM 40 MG PO CPDR: DELAYED_RELEASE_CAPSULE | ORAL | 2 refills | Status: DC

## 2017-11-15 NOTE — Telephone Encounter (Signed)
Copied from [place] [id number]. Topic: Quick Communication - Rx Refill/Question >> Nov 15, 2017  9:46 AM Scherrie Gerlach wrote: Medication: esomeprazole (NEXIUM) 40 MG capsule 90 day [facility] states they sent this request 11/04. Pt is out of this med today. Hopes to get asap [place], [place] [phone] (Phone) [phone] (Fax)

## 2017-11-16 ENCOUNTER — Inpatient Hospital Stay: Payer: Medicare Other | Attending: Oncology

## 2017-11-16 ENCOUNTER — Other Ambulatory Visit: Payer: Self-pay

## 2017-11-16 DIAGNOSIS — D7589 Other specified diseases of blood and blood-forming organs: Secondary | ICD-10-CM | POA: Insufficient documentation

## 2017-11-16 DIAGNOSIS — C50912 Malignant neoplasm of unspecified site of left female breast: Secondary | ICD-10-CM

## 2017-11-16 DIAGNOSIS — Z170 Estrogen receptor positive status [ER+]: Secondary | ICD-10-CM | POA: Diagnosis not present

## 2017-11-16 DIAGNOSIS — I10 Essential (primary) hypertension: Secondary | ICD-10-CM | POA: Diagnosis not present

## 2017-11-16 DIAGNOSIS — K219 Gastro-esophageal reflux disease without esophagitis: Secondary | ICD-10-CM | POA: Insufficient documentation

## 2017-11-16 DIAGNOSIS — E785 Hyperlipidemia, unspecified: Secondary | ICD-10-CM | POA: Diagnosis not present

## 2017-11-16 DIAGNOSIS — Z79811 Long term (current) use of aromatase inhibitors: Secondary | ICD-10-CM | POA: Diagnosis not present

## 2017-11-16 DIAGNOSIS — C50112 Malignant neoplasm of central portion of left female breast: Secondary | ICD-10-CM | POA: Insufficient documentation

## 2017-11-16 DIAGNOSIS — Z79899 Other long term (current) drug therapy: Secondary | ICD-10-CM | POA: Diagnosis not present

## 2017-11-16 DIAGNOSIS — C7800 Secondary malignant neoplasm of unspecified lung: Secondary | ICD-10-CM | POA: Insufficient documentation

## 2017-11-16 LAB — CBC WITH DIFFERENTIAL/PLATELET
ABS IMMATURE GRANULOCYTES: 0.01 10*3/uL (ref 0.00–0.07)
BASOS ABS: 0 10*3/uL (ref 0.0–0.1)
BASOS PCT: 1 %
EOS PCT: 1 %
Eosinophils Absolute: 0 10*3/uL (ref 0.0–0.5)
HCT: 41.3 % (ref 36.0–46.0)
Hemoglobin: 14.2 g/dL (ref 12.0–15.0)
Immature Granulocytes: 0 %
Lymphocytes Relative: 21 %
Lymphs Abs: 0.7 10*3/uL (ref 0.7–4.0)
MCH: 35.5 pg — AB (ref 26.0–34.0)
MCHC: 34.4 g/dL (ref 30.0–36.0)
MCV: 103.3 fL — ABNORMAL HIGH (ref 80.0–100.0)
MONO ABS: 0.3 10*3/uL (ref 0.1–1.0)
Monocytes Relative: 10 %
NEUTROS ABS: 2.1 10*3/uL (ref 1.7–7.7)
NRBC: 0 % (ref 0.0–0.2)
Neutrophils Relative %: 67 %
PLATELETS: 185 10*3/uL (ref 150–400)
RBC: 4 MIL/uL (ref 3.87–5.11)
RDW: 16.2 % — ABNORMAL HIGH (ref 11.5–15.5)
WBC: 3.1 10*3/uL — AB (ref 4.0–10.5)

## 2017-11-17 LAB — CANCER ANTIGEN 27.29: CAN 27.29: 29.6 U/mL (ref 0.0–38.6)

## 2017-11-17 LAB — CANCER ANTIGEN 15-3: CAN 15 3: 23.2 U/mL (ref 0.0–25.0)

## 2017-11-20 ENCOUNTER — Other Ambulatory Visit: Payer: Self-pay | Admitting: Family

## 2017-11-20 DIAGNOSIS — K219 Gastro-esophageal reflux disease without esophagitis: Secondary | ICD-10-CM

## 2017-11-20 MED ORDER — ESOMEPRAZOLE MAGNESIUM 20 MG PO CPDR: 20.0000 mg | DELAYED_RELEASE_CAPSULE | Freq: Every day | ORAL | 1 refills | Status: DC

## 2017-11-20 NOTE — Telephone Encounter (Signed)
Spoke with [name] , caregiver patient takes for GERD . She is ok with decreasing down to 20mg  dose.

## 2017-11-20 NOTE — Telephone Encounter (Signed)
Left voicemail for [facility] per DPR to call

## 2017-11-20 NOTE — Telephone Encounter (Signed)
Sent in nexium 20mg  qd

## 2017-11-22 ENCOUNTER — Telehealth: Payer: Self-pay | Admitting: Pharmacist

## 2017-11-22 ENCOUNTER — Other Ambulatory Visit: Payer: Self-pay | Admitting: Oncology

## 2017-11-22 NOTE — Telephone Encounter (Signed)
...     Ref Range & Units 6d ago  WBC 4.0 - 10.5 K/uL 3.1Low    RBC 3.87 - 5.11 MIL/uL 4.00   Hemoglobin 12.0 - 15.0 g/dL 14.2   HCT 36.0 - 46.0 % 41.3   MCV 80.0 - 100.0 fL 103.3High    MCH 26.0 - 34.0 pg 35.5High    MCHC 30.0 - 36.0 g/dL 34.4   RDW 11.5 - 15.5 % 16.2High    Platelets 150 - 400 K/uL 185   nRBC 0.0 - 0.2 % 0.0   Neutrophils Relative % % 67   Neutro Abs 1.7 - 7.7 K/uL 2.1   Lymphocytes Relative % 21   Lymphs Abs 0.7 - 4.0 K/uL 0.7   Monocytes Relative % 10   Monocytes Absolute 0.1 - 1.0 K/uL 0.3   Eosinophils Relative % 1   Eosinophils Absolute 0.0 - 0.5 K/uL 0.0   Basophils Relative % 1   Basophils Absolute 0.0 - 0.1 K/uL 0.0   Immature Granulocytes % 0   Abs Immature Granulocytes 0.00 - 0.07 K/uL 0.01   Comment: Performed at [facility], [address] [place], [place] [address]  [place]  [facility] CLIN LAB      Specimen Collected: 11/16/17 11:35 Last Resulted: 11/16/17 11:55

## 2017-11-22 NOTE — Telephone Encounter (Signed)
Oral Chemotherapy Pharmacist Encounter  Follow-Up Form  Called patient today to follow up regarding patient's oral chemotherapy medication: Ibrance (palbociclib)  Original Start date of oral chemotherapy: 08/2017  Pt reports 0 tablets/doses of Ibrance missed so far this cycle.   Pt reports the following side effects: None reported  Recent labs reviewed: CA 27.29 from 11/16/17  New medications?: None reported  Other Issues: None reported  Patient knows to call the office with questions or concerns. Oral Oncology Clinic will continue to follow.  [name], PharmD, BCPS, [facility] Hematology/Oncology Clinical Pharmacist [facility]/AP [facility] [place] [facility] [phone]  11/22/2017 4:07 PM

## 2017-11-27 MED FILL — IBRANCE 75 MG CAPSULE: 75 | 28 days supply | Qty: 21 | Fill #0

## 2017-11-30 ENCOUNTER — Inpatient Hospital Stay (HOSPITAL_BASED_OUTPATIENT_CLINIC_OR_DEPARTMENT_OTHER): Payer: Medicare Other | Admitting: Oncology

## 2017-11-30 ENCOUNTER — Encounter: Payer: Self-pay | Admitting: Oncology

## 2017-11-30 ENCOUNTER — Inpatient Hospital Stay: Payer: Medicare Other

## 2017-11-30 VITALS — BP 138/85 | HR 99 | Temp 97.2°F | Resp 18 | Ht <= 58 in | Wt 89.0 lb

## 2017-11-30 DIAGNOSIS — K219 Gastro-esophageal reflux disease without esophagitis: Secondary | ICD-10-CM | POA: Diagnosis not present

## 2017-11-30 DIAGNOSIS — Z79899 Other long term (current) drug therapy: Secondary | ICD-10-CM | POA: Diagnosis not present

## 2017-11-30 DIAGNOSIS — Z170 Estrogen receptor positive status [ER+]: Principal | ICD-10-CM

## 2017-11-30 DIAGNOSIS — C7800 Secondary malignant neoplasm of unspecified lung: Secondary | ICD-10-CM

## 2017-11-30 DIAGNOSIS — C50912 Malignant neoplasm of unspecified site of left female breast: Secondary | ICD-10-CM

## 2017-11-30 DIAGNOSIS — D7589 Other specified diseases of blood and blood-forming organs: Secondary | ICD-10-CM

## 2017-11-30 DIAGNOSIS — I10 Essential (primary) hypertension: Secondary | ICD-10-CM

## 2017-11-30 DIAGNOSIS — C50112 Malignant neoplasm of central portion of left female breast: Secondary | ICD-10-CM

## 2017-11-30 DIAGNOSIS — E785 Hyperlipidemia, unspecified: Secondary | ICD-10-CM | POA: Diagnosis not present

## 2017-11-30 DIAGNOSIS — Z79811 Long term (current) use of aromatase inhibitors: Secondary | ICD-10-CM | POA: Diagnosis not present

## 2017-11-30 LAB — CBC WITH DIFFERENTIAL/PLATELET
Abs Immature Granulocytes: 0.02 10*3/uL (ref 0.00–0.07)
BASOS PCT: 2 %
Basophils Absolute: 0.1 10*3/uL (ref 0.0–0.1)
EOS ABS: 0 10*3/uL (ref 0.0–0.5)
Eosinophils Relative: 0 %
HCT: 43.2 % (ref 36.0–46.0)
Hemoglobin: 14.3 g/dL (ref 12.0–15.0)
Immature Granulocytes: 0 %
LYMPHS ABS: 0.6 10*3/uL — AB (ref 0.7–4.0)
LYMPHS PCT: 10 %
MCH: 35.3 pg — AB (ref 26.0–34.0)
MCHC: 33.1 g/dL (ref 30.0–36.0)
MCV: 106.7 fL — ABNORMAL HIGH (ref 80.0–100.0)
Monocytes Absolute: 0.6 10*3/uL (ref 0.1–1.0)
Monocytes Relative: 11 %
NEUTROS PCT: 77 %
NRBC: 0 % (ref 0.0–0.2)
Neutro Abs: 4.1 10*3/uL (ref 1.7–7.7)
Platelets: 179 10*3/uL (ref 150–400)
RBC: 4.05 MIL/uL (ref 3.87–5.11)
RDW: 15.9 % — AB (ref 11.5–15.5)
WBC: 5.3 10*3/uL (ref 4.0–10.5)

## 2017-11-30 NOTE — Progress Notes (Signed)
No new changes noted today 

## 2017-12-01 NOTE — Progress Notes (Signed)
Hematology/Oncology Consult note [facility]  [phone] [phone]  Patient Care Team: Burnard Hawthorne, FNP as PCP - General (Family Medicine)   Name of the patient: Kathy Mckay  [id number]  October 29, 1932   Date of visit: 12/01/17  Diagnosis-  stage IV invasive mammary carcinoma of the left breast cT4b cN1 M1 with lung metastases ER PR positive HER-2/neu negative   Chief complaint/ Reason for visit-routine follow-up of breast cancer on Ibrance and tamoxifen  Heme/Onc history: patient is a 84 year old female with developmental disorder. She is a ward of the state and lives in a group home. She is here with her caregiver Ms. Tyree.She has been at the group home for the last 4 years. She does not have any living family other than a distant cousin. Her assigned social worker Ms.Jackson is her healthcare proxy. Her past medical history is significant forhypertension hyperlipidemia and osteoporosis among other medical problems.Her caregiver states that overall this patient is a happy-go-lucky person and has been doing well at her nursing home. She has not had any recent falls or hospitalizations. She recently underwent a screening mammogram which picked up a mass in her left breast. Patient has not had a prior mammogram before this for years.  Mammogram and ultrasound showed 2.9 x 2.8 x 3 cm mass in the left retroareolar region. There is extension into the skin and nipple areole or complex. Associated skin thickening. At least 2 abnormal appearing left axillary lymph nodes with cortical thickening up to 1 cm.  Core biopsy of the breast mass and the axillary lymph node revealed invasive mammary carcinoma, grade 2 ER Greater than 90% and PR11-50% positive and HER-2/neu negative.Extracapsular extension was noted in the lymph node biopsy.  Patient herself is unable to understand her ongoing medical issues she denies any complaints  today. Caregiver reports that her appetite is good.And she has not had any unintentional weight loss  CT chest abdomen and pelvis showed left breast mass, suspicious left axillary and internal mammary adenopathy along with bilateral pulmonary nodules measuring up to 16 mm in diameter consistent with metastatic disease. No evidence of bone metastases on bone scan  Patient was started on ibrance 100 mg. She could not tolerate the dose due to refractory nausea and dose was reduced to 75 mg   Interval history-she is here with her caregiver today.  Patient reports doing well and denies any nausea or vomiting presently.  Denies any pain.  Caregiver reports no concerns at this time.  She is tolerating both tamoxifen and Ibrance well.  Her appetite is good.  She has not noticed any changes in her energy levels or behavior  ECOG PS- 2 Pain scale- 0 Opioid associated constipation- no  Review of systems-Limited because of developmental delay   Review of Systems  Constitutional: Negative for chills, fever, malaise/fatigue and weight loss.  HENT: Negative for congestion, ear discharge and nosebleeds.   Eyes: Negative for blurred vision.  Respiratory: Negative for cough, hemoptysis, sputum production, shortness of breath and wheezing.   Cardiovascular: Negative for chest pain, palpitations, orthopnea and claudication.  Gastrointestinal: Negative for abdominal pain, blood in stool, constipation, diarrhea, heartburn, melena, nausea and vomiting.  Genitourinary: Negative for dysuria, flank pain, frequency, hematuria and urgency.  Musculoskeletal: Negative for back pain, joint pain and myalgias.  Skin: Negative for rash.  Neurological: Negative for dizziness, tingling, focal weakness, seizures, weakness and headaches.  Endo/Heme/Allergies: Does not bruise/bleed easily.  Psychiatric/Behavioral: Negative for depression and suicidal  ideas. The patient does not have insomnia.       No Known  Allergies   Past Medical History:  Diagnosis Date  . Developmental delay   . GERD (gastroesophageal reflux disease)   . Hyperlipidemia   . Hypertension   . Osteoporosis      Past Surgical History:  Procedure Laterality Date  . ABDOMINAL HYSTERECTOMY    . BREAST BIOPSY Left 07/19/2017   invasive mammary carcinoma  . BREAST BIOPSY Left 07/19/2017   invasive mammary carcinoma  . SHOULDER SURGERY      Social History   Socioeconomic History  . Marital status: Single    Spouse name: Not on file  . Number of children: Not on file  . Years of education: Not on file  . Highest education level: Not on file  Occupational History  . Not on file  Social Needs  . Financial resource strain: Not on file  . Food insecurity:    Worry: Not on file    Inability: Not on file  . Transportation needs:    Medical: Not on file    Non-medical: Not on file  Tobacco Use  . Smoking status: Never Smoker  . Smokeless tobacco: Never Used  Substance and Sexual Activity  . Alcohol use: No  . Drug use: No  . Sexual activity: Never  Lifestyle  . Physical activity:    Days per week: Not on file    Minutes per session: Not on file  . Stress: Not on file  Relationships  . Social connections:    Talks on phone: Not on file    Gets together: Not on file    Attends religious service: Not on file    Active member of club or organization: Not on file    Attends meetings of clubs or organizations: Not on file    Relationship status: Not on file  . Intimate partner violence:    Fear of current or ex partner: Not on file    Emotionally abused: Not on file    Physically abused: Not on file    Forced sexual activity: Not on file  Other Topics Concern  . Not on file  Social History Narrative   Lives at a family care home. No living relatives. Guardian of the state.      Lives [facility] for past 6 years.     Family History  Problem Relation Age of Onset  . Diabetes Mother   . Diabetes  Father   . Cancer Sister      Current Outpatient Medications:  .  amLODipine (NORVASC) 10 MG tablet, TAKE 1 TABLET BY MOUTH DAILY, Disp: 90 tablet, Rfl: 1 .  atorvastatin (LIPITOR) 40 MG tablet, TAKE 1 TABLET BY MOUTH ONCE DAILY, Disp: 90 tablet, Rfl: 1 .  esomeprazole (NEXIUM) 20 MG capsule, Take 1 capsule (20 mg total) by mouth daily. TAKE 1 CAPSULE BY MOUTH ONCE DAILY AT 8 AM, Disp: 90 capsule, Rfl: 1 .  HYDROcodone-acetaminophen (NORCO/VICODIN) 5-325 MG tablet, TAKE 1 TABLET BY MOUTH EVERY 6 HOURS AS NEEDED FOR UP TO 5 DAYS(PAIN). MAX OF 300MG OF ACETAMINOPHEN IN 24 HOURS, Disp: 30 tablet, Rfl: 0 .  IBRANCE 75 MG capsule, TAKE 1 CAPSULE (75 MG TOTAL) BY MOUTH DAILY WITH BREAKFAST. TAKE WHOLE WITH FOOD. TAKE FOR 21 DAYS ON, 7 DAYS OFF, REPEAT EVERY 28 DAYS., Disp: 21 capsule, Rfl: 1 .  mirtazapine (REMERON) 45 MG tablet, TAKE 1 TABLET BY MOUTH AT BEDTIME, Disp: 30  tablet, Rfl: 1 .  tamoxifen (NOLVADEX) 20 MG tablet, TAKE 1 TABLET BY MOUTH ONCE DAILY, Disp: 30 tablet, Rfl: 3 .  Menthol, Topical Analgesic, (BIOFREEZE) 4 % GEL, 1 application to painful area every 4 hours as needed (Patient not taking: Reported on 09/21/2017), Disp: 89 mL, Rfl: 2 .  prochlorperazine (COMPAZINE) 10 MG tablet, TAKE 1 TABLET BY MOUTH EVERY 8 HOURS AS NEEDED FOR NAUSEA OR VOMITING. (Patient not taking: Reported on 09/21/2017), Disp: 30 tablet, Rfl: 0  Physical exam:  Vitals:   11/30/17 1048  BP: 138/85  Pulse: 99  Resp: 18  Temp: (!) 97.2 F (36.2 C)  TempSrc: Tympanic  SpO2: 96%  Weight: 89 lb (40.4 kg)  Height: _0  (1.321 m)   Physical Exam  Constitutional:  Thin elderly female sitting in a wheelchair.  Appears in no acute distress  HENT:  Head: Normocephalic and atraumatic.  Eyes: Pupils are equal, round, and reactive to light. EOM are normal.  Neck: Normal range of motion.  Cardiovascular: Normal rate, regular rhythm and normal heart sounds.  Pulmonary/Chest: Effort normal and breath sounds  normal.  Abdominal: Soft. Bowel sounds are normal.  Neurological:  Oriented to self  Skin: Skin is warm and dry.  Left breast mass appears decreased in size about 3 to 4 cm in size.  There is overlying crusting of the skin of the area Ola and the nipple which also overall looks improved as compared to before  CMP Latest Ref Rng & Units 09/21/2017  Glucose 70 - 99 mg/dL 201(H)  BUN 8 - 23 mg/dL 22  Creatinine 0.44 - 1.00 mg/dL 0.82  Sodium 135 - 145 mmol/L 134(L)  Potassium 3.5 - 5.1 mmol/L 3.5  Chloride 98 - 111 mmol/L 100  CO2 22 - 32 mmol/L 24  Calcium 8.9 - 10.3 mg/dL 9.2  Total Protein 6.5 - 8.1 g/dL 7.4  Total Bilirubin 0.3 - 1.2 mg/dL 1.1  Alkaline Phos 38 - 126 U/L 86  AST 15 - 41 U/L 43(H)  ALT 0 - 44 U/L 34   CBC Latest Ref Rng & Units 11/30/2017  WBC 4.0 - 10.5 K/uL 5.3  Hemoglobin 12.0 - 15.0 g/dL 14.3  Hematocrit 36.0 - 46.0 % 43.2  Platelets 150 - 400 K/uL 179     Assessment and plan- Patient is a 84 y.o. female  with invasive mammary carcinoma of the left breast stage IV cT4b cN1 M1 ER PR positive HER-2/neu negative with lung metastases.  She is here for routine follow-up of breast cancer on Ibrance and letrozole  Patient started taking her cycle 4 of Ibrance today at 75 mg.  Overall her counts are okay to proceed with treatment 3 weeks on 1 week off.  She is also taking tamoxifen daily.  She seems to be tolerating this combination well.  Her tumor markers so far have been normal.  I will plan to get scans after about 6 cycles.   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  Continue to monitor   Visit Diagnosis 1. Malignant neoplasm of left breast in female, estrogen receptor positive, unspecified site of breast ([place])   2. High risk medication use   3. Macrocytosis      Dr. Randa Evens, MD, MPH [facility] at [facility] [phone] 12/01/2017 10:29 AM

## 2017-12-11 ENCOUNTER — Other Ambulatory Visit: Payer: Self-pay | Admitting: Family

## 2017-12-13 ENCOUNTER — Other Ambulatory Visit: Payer: Self-pay

## 2017-12-25 MED FILL — IBRANCE 75 MG CAPSULE: 75 | 28 days supply | Qty: 21 | Fill #1

## 2017-12-25 NOTE — Progress Notes (Deleted)
Hematology/Oncology Consult note [facility]  [phone] [phone]  Patient Care Team: Burnard Hawthorne, FNP as PCP - General (Family Medicine)   Name of the patient: Kathy Mckay  [id number]  June 16, 1932   Date of visit: 12/25/17  Diagnosis- stage IV invasive mammary carcinoma of the left breast cT4b cN1 M1 with lung metastases ER PR positive HER-2/neu negative   Chief complaint/ Reason for visit-routine follow-up of breast cancer on Ibrance and tamoxifen  Heme/Onc history: patient is a 84 year old female with developmental disorder. She is a ward of the state and lives in a group home. She is here with her caregiver Ms. Tyree.She has been at the group home for the last 4 years. She does not have any living family other than a distant cousin. Her assigned social worker Ms.Jackson is her healthcare proxy. Her past medical history is significant forhypertension hyperlipidemia and osteoporosis among other medical problems.Her caregiver states that overall this patient is a happy-go-lucky person and has been doing well at her nursing home. She has not had any recent falls or hospitalizations. She recently underwent a screening mammogram which picked up a mass in her left breast. Patient has not had a prior mammogram before this for years.  Mammogram and ultrasound showed 2.9 x 2.8 x 3 cm mass in the left retroareolar region. There is extension into the skin and nipple areole or complex. Associated skin thickening. At least 2 abnormal appearing left axillary lymph nodes with cortical thickening up to 1 cm.  Core biopsy of the breast mass and the axillary lymph node revealed invasive mammary carcinoma, grade 2 ER Greater than 90% and PR11-50% positive and HER-2/neu negative.Extracapsular extension was noted in the lymph node biopsy.  Patient herself is unable to understand her ongoing medical issues she denies any complaints  today. Caregiver reports that her appetite is good.And she has not had any unintentional weight loss  CT chest abdomen and pelvis showed left breast mass, suspicious left axillary and internal mammary adenopathy along with bilateral pulmonary nodules measuring up to 16 mm in diameter consistent with metastatic disease. No evidence of bone metastases on bone scan  Patient was started on ibrance 100 mg. She could not tolerate the dose due to refractory nausea and dose was reduced to 75 mg    Interval history- ***  ECOG PS- *** Pain scale- *** Opioid associated constipation- ***  Review of systems- Review of Systems  Constitutional: Negative for chills, fever, malaise/fatigue and weight loss.  HENT: Negative for congestion, ear discharge and nosebleeds.   Eyes: Negative for blurred vision.  Respiratory: Negative for cough, hemoptysis, sputum production, shortness of breath and wheezing.   Cardiovascular: Negative for chest pain, palpitations, orthopnea and claudication.  Gastrointestinal: Negative for abdominal pain, blood in stool, constipation, diarrhea, heartburn, melena, nausea and vomiting.  Genitourinary: Negative for dysuria, flank pain, frequency, hematuria and urgency.  Musculoskeletal: Negative for back pain, joint pain and myalgias.  Skin: Negative for rash.  Neurological: Negative for dizziness, tingling, focal weakness, seizures, weakness and headaches.  Endo/Heme/Allergies: Does not bruise/bleed easily.  Psychiatric/Behavioral: Negative for depression and suicidal ideas. The patient does not have insomnia.       No Known Allergies   Past Medical History:  Diagnosis Date  . Developmental delay   . GERD (gastroesophageal reflux disease)   . Hyperlipidemia   . Hypertension   . Osteoporosis      Past Surgical History:  Procedure Laterality Date  . ABDOMINAL  HYSTERECTOMY    . BREAST BIOPSY Left 07/19/2017   invasive mammary carcinoma  . BREAST BIOPSY Left  07/19/2017   invasive mammary carcinoma  . SHOULDER SURGERY      Social History   Socioeconomic History  . Marital status: Single    Spouse name: Not on file  . Number of children: Not on file  . Years of education: Not on file  . Highest education level: Not on file  Occupational History  . Not on file  Social Needs  . Financial resource strain: Not on file  . Food insecurity:    Worry: Not on file    Inability: Not on file  . Transportation needs:    Medical: Not on file    Non-medical: Not on file  Tobacco Use  . Smoking status: Never Smoker  . Smokeless tobacco: Never Used  Substance and Sexual Activity  . Alcohol use: No  . Drug use: No  . Sexual activity: Never  Lifestyle  . Physical activity:    Days per week: Not on file    Minutes per session: Not on file  . Stress: Not on file  Relationships  . Social connections:    Talks on phone: Not on file    Gets together: Not on file    Attends religious service: Not on file    Active member of club or organization: Not on file    Attends meetings of clubs or organizations: Not on file    Relationship status: Not on file  . Intimate partner violence:    Fear of current or ex partner: Not on file    Emotionally abused: Not on file    Physically abused: Not on file    Forced sexual activity: Not on file  Other Topics Concern  . Not on file  Social History Narrative   Lives at a family care home. No living relatives. Guardian of the state.      Lives [facility] for past 6 years.     Family History  Problem Relation Age of Onset  . Diabetes Mother   . Diabetes Father   . Cancer Sister      Current Outpatient Medications:  .  amLODipine (NORVASC) 10 MG tablet, TAKE 1 TABLET BY MOUTH DAILY, Disp: 90 tablet, Rfl: 1 .  atorvastatin (LIPITOR) 40 MG tablet, TAKE 1 TABLET BY MOUTH ONCE DAILY, Disp: 90 tablet, Rfl: 1 .  esomeprazole (NEXIUM) 20 MG capsule, Take 1 capsule (20 mg total) by mouth daily. TAKE 1  CAPSULE BY MOUTH ONCE DAILY AT 8 AM, Disp: 90 capsule, Rfl: 1 .  HYDROcodone-acetaminophen (NORCO/VICODIN) 5-325 MG tablet, TAKE 1 TABLET BY MOUTH EVERY 6 HOURS AS NEEDED FOR UP TO 5 DAYS(PAIN). MAX OF 300MG OF ACETAMINOPHEN IN 24 HOURS, Disp: 30 tablet, Rfl: 0 .  IBRANCE 75 MG capsule, TAKE 1 CAPSULE (75 MG TOTAL) BY MOUTH DAILY WITH BREAKFAST. TAKE WHOLE WITH FOOD. TAKE FOR 21 DAYS ON, 7 DAYS OFF, REPEAT EVERY 28 DAYS., Disp: 21 capsule, Rfl: 1 .  Menthol, Topical Analgesic, (BIOFREEZE) 4 % GEL, 1 application to painful area every 4 hours as needed (Patient not taking: Reported on 09/21/2017), Disp: 89 mL, Rfl: 2 .  mirtazapine (REMERON) 45 MG tablet, TAKE 1 TABLET BY MOUTH AT BEDTIME, Disp: 90 tablet, Rfl: 3 .  prochlorperazine (COMPAZINE) 10 MG tablet, TAKE 1 TABLET BY MOUTH EVERY 8 HOURS AS NEEDED FOR NAUSEA OR VOMITING. (Patient not taking: Reported on 09/21/2017), Disp: 30  tablet, Rfl: 0 .  tamoxifen (NOLVADEX) 20 MG tablet, TAKE 1 TABLET BY MOUTH ONCE DAILY, Disp: 30 tablet, Rfl: 3  Physical exam: There were no vitals filed for this visit. Physical Exam HENT:     Head: Normocephalic and atraumatic.  Eyes:     Pupils: Pupils are equal, round, and reactive to light.  Neck:     Musculoskeletal: Normal range of motion.  Cardiovascular:     Rate and Rhythm: Normal rate and regular rhythm.     Heart sounds: Normal heart sounds.  Pulmonary:     Effort: Pulmonary effort is normal.     Breath sounds: Normal breath sounds.  Abdominal:     General: Bowel sounds are normal.     Palpations: Abdomen is soft.  Skin:    General: Skin is warm and dry.  Neurological:     Mental Status: She is alert and oriented to person, place, and time.      CMP Latest Ref Rng & Units 09/21/2017  Glucose 70 - 99 mg/dL 201(H)  BUN 8 - 23 mg/dL 22  Creatinine 0.44 - 1.00 mg/dL 0.82  Sodium 135 - 145 mmol/L 134(L)  Potassium 3.5 - 5.1 mmol/L 3.5  Chloride 98 - 111 mmol/L 100  CO2 22 - 32 mmol/L 24  Calcium  8.9 - 10.3 mg/dL 9.2  Total Protein 6.5 - 8.1 g/dL 7.4  Total Bilirubin 0.3 - 1.2 mg/dL 1.1  Alkaline Phos 38 - 126 U/L 86  AST 15 - 41 U/L 43(H)  ALT 0 - 44 U/L 34   CBC Latest Ref Rng & Units 11/30/2017  WBC 4.0 - 10.5 K/uL 5.3  Hemoglobin 12.0 - 15.0 g/dL 14.3  Hematocrit 36.0 - 46.0 % 43.2  Platelets 150 - 400 K/uL 179      Assessment and plan- Patient is a 84 y.o. female withinvasive mammary carcinoma of the left breast stage IV cT4b cN1 M1 ER PR positive HER-2/neu negative with lung metastases.   She is here for routine follow-up of breast cancer on Ibrance and letrozole  Counts okay to proceed with cycle 5 of Ibrance today.  She will also continue to take tamoxifen along with calcium and vitamin D.  We will plan to get repeat scans after about 6 cycles.  She will return to clinic in 4 weeks time with CBC and CMP prior to cycle 6 of Ibrance and I will see her at that time   Visit Diagnosis 1. Malignant neoplasm of left breast in female, estrogen receptor positive, unspecified site of breast ([place])   2. High risk medication use      Dr. Randa Evens, MD, MPH [facility] at [facility] [phone] 12/25/2017 1:57 PM

## 2017-12-26 ENCOUNTER — Inpatient Hospital Stay: Payer: Medicare Other | Admitting: Oncology

## 2017-12-26 ENCOUNTER — Inpatient Hospital Stay: Payer: Medicare Other

## 2018-01-16 ENCOUNTER — Other Ambulatory Visit: Payer: Self-pay | Admitting: Oncology

## 2018-01-16 ENCOUNTER — Inpatient Hospital Stay (HOSPITAL_BASED_OUTPATIENT_CLINIC_OR_DEPARTMENT_OTHER): Payer: Medicare Other | Admitting: Oncology

## 2018-01-16 ENCOUNTER — Other Ambulatory Visit: Payer: Self-pay

## 2018-01-16 ENCOUNTER — Inpatient Hospital Stay: Payer: Medicare Other | Attending: Oncology

## 2018-01-16 ENCOUNTER — Encounter: Payer: Self-pay | Admitting: Oncology

## 2018-01-16 VITALS — BP 133/79 | HR 93 | Temp 97.2°F | Resp 16 | Ht <= 58 in | Wt 83.9 lb

## 2018-01-16 DIAGNOSIS — E785 Hyperlipidemia, unspecified: Secondary | ICD-10-CM

## 2018-01-16 DIAGNOSIS — Z170 Estrogen receptor positive status [ER+]: Secondary | ICD-10-CM | POA: Insufficient documentation

## 2018-01-16 DIAGNOSIS — Z79811 Long term (current) use of aromatase inhibitors: Secondary | ICD-10-CM | POA: Diagnosis not present

## 2018-01-16 DIAGNOSIS — K219 Gastro-esophageal reflux disease without esophagitis: Secondary | ICD-10-CM | POA: Insufficient documentation

## 2018-01-16 DIAGNOSIS — C50912 Malignant neoplasm of unspecified site of left female breast: Secondary | ICD-10-CM | POA: Diagnosis not present

## 2018-01-16 DIAGNOSIS — C7800 Secondary malignant neoplasm of unspecified lung: Secondary | ICD-10-CM | POA: Diagnosis not present

## 2018-01-16 DIAGNOSIS — Z79899 Other long term (current) drug therapy: Secondary | ICD-10-CM | POA: Diagnosis not present

## 2018-01-16 DIAGNOSIS — I10 Essential (primary) hypertension: Secondary | ICD-10-CM | POA: Diagnosis not present

## 2018-01-16 LAB — CBC WITH DIFFERENTIAL/PLATELET
ABS IMMATURE GRANULOCYTES: 0.03 10*3/uL (ref 0.00–0.07)
BASOS ABS: 0 10*3/uL (ref 0.0–0.1)
Basophils Relative: 1 %
Eosinophils Absolute: 0 10*3/uL (ref 0.0–0.5)
Eosinophils Relative: 1 %
HCT: 42.8 % (ref 36.0–46.0)
Hemoglobin: 14.6 g/dL (ref 12.0–15.0)
IMMATURE GRANULOCYTES: 1 %
Lymphocytes Relative: 26 %
Lymphs Abs: 1.1 10*3/uL (ref 0.7–4.0)
MCH: 36.8 pg — ABNORMAL HIGH (ref 26.0–34.0)
MCHC: 34.1 g/dL (ref 30.0–36.0)
MCV: 107.8 fL — ABNORMAL HIGH (ref 80.0–100.0)
MONOS PCT: 7 %
Monocytes Absolute: 0.3 10*3/uL (ref 0.1–1.0)
NEUTROS ABS: 2.6 10*3/uL (ref 1.7–7.7)
NEUTROS PCT: 64 %
NRBC: 0 % (ref 0.0–0.2)
PLATELETS: 142 10*3/uL — AB (ref 150–400)
RBC: 3.97 MIL/uL (ref 3.87–5.11)
RDW: 13.4 % (ref 11.5–15.5)
WBC: 4 10*3/uL (ref 4.0–10.5)

## 2018-01-16 LAB — COMPREHENSIVE METABOLIC PANEL
ALBUMIN: 3.9 g/dL (ref 3.5–5.0)
ALT: 30 U/L (ref 0–44)
ANION GAP: 14 (ref 5–15)
AST: 38 U/L (ref 15–41)
Alkaline Phosphatase: 85 U/L (ref 38–126)
BILIRUBIN TOTAL: 0.7 mg/dL (ref 0.3–1.2)
BUN: 19 mg/dL (ref 8–23)
CO2: 20 mmol/L — ABNORMAL LOW (ref 22–32)
Calcium: 9 mg/dL (ref 8.9–10.3)
Chloride: 107 mmol/L (ref 98–111)
Creatinine, Ser: 0.88 mg/dL (ref 0.44–1.00)
GFR calc non Af Amer: >60 mL/min (ref >60)
GLUCOSE: 152 mg/dL — AB (ref 70–99)
POTASSIUM: 3 mmol/L — AB (ref 3.5–5.1)
SODIUM: 141 mmol/L (ref 135–145)
Total Protein: 7.1 g/dL (ref 6.5–8.1)

## 2018-01-16 NOTE — Progress Notes (Signed)
Patient care giver reports Nausea  vomiting for about 3 days this past week but it has resolved.

## 2018-01-16 NOTE — Telephone Encounter (Signed)
...   Ref Range & Units 10:43 (01/16/18) 89mo ago (11/30/17) 39mo ago (11/16/17) 63mo ago (10/27/17) 4mo ago (09/21/17) 77mo ago (09/07/17) 37mo ago (08/21/17)  WBC 4.0 - 10.5 K/uL 4.0  5.3  3.1Low   2.6Low   4.4 R 2.9Low  R 6.6 R  RBC 3.87 - 5.11 MIL/uL 3.97  4.05  4.00  4.32  4.39 R 4.61 R 4.75 R  Hemoglobin 12.0 - 15.0 g/dL 14.6  14.3  14.2  14.9  14.9 R 15.6 R 16.0 R  HCT 36.0 - 46.0 % 42.8  43.2  41.3  44.1  43.6 R 46.0 R 46.9 R  MCV 80.0 - 100.0 fL 107.8High   106.7High   103.3High   102.1High   99.3  99.9  98.7   MCH 26.0 - 34.0 pg 36.8High   35.3High   35.5High   34.5High   34.0  33.9  33.6   MCHC 30.0 - 36.0 g/dL 34.1  33.1  34.4  33.8  34.3 R 33.9 R 34.1 R  RDW 11.5 - 15.5 % 13.4  15.9High   16.2High   16.5High   14.5 R 13.6 R 13.6 R  Platelets 150 - 400 K/uL 142Low   179  185  90Low   241 R 99Low  R 210 R  nRBC 0.0 - 0.2 % 0.0  0.0  0.0  0.0      Neutrophils Relative % % 64  77  67  61  77  79  76   Neutro Abs 1.7 - 7.7 K/uL 2.6  4.1  2.1  1.6Low   3.4 R 2.3 R 5.1 R  Lymphocytes Relative % 26  10  21  30  9  14  13    Lymphs Abs 0.7 - 4.0 K/uL 1.1  0.6Low   0.7  0.8  0.4Low  R 0.4Low  R 0.8Low  R  Monocytes Relative % 7  11  10  7  13  6  9    Monocytes Absolute 0.1 - 1.0 K/uL 0.3  0.6  0.3  0.2  0.6 R 0.2 R 0.6 R  Eosinophils Relative % 1  0  1  1  0  0  1   Eosinophils Absolute 0.0 - 0.5 K/uL 0.0  0.0  0.0  0.0  0.0 R 0.0 R 0.0 R  Basophils Relative % 1  2  1  1  1  1  1    Basophils Absolute 0.0 - 0.1 K/uL 0.0  0.1  0.0  0.0  0.0 R, CM 0.0 R, CM 0.0 R, CM  Immature Granulocytes % 1  0  0  0      Abs Immature Granulocytes 0.00 - 0.07 K/uL 0.03  0.02 CM 0.01 CM 0.01 CM     Comment: Performed at [facility], [address] [place], [place] [address]  Resulting Agency  [facility] CLIN LAB [place] CLIN LAB [place] CLIN LAB [place] CLIN LAB [place] CL[facility] LAB [place] [facility] [place] [facility]      Specimen Collected: 01/16/18 10:43  Last Resulted: 01/16/18 10:54       ...   Ref Range & Units 10:43 (01/16/18) 47mo  ago (09/21/17) 70mo ago (09/07/17) 30mo ago (08/21/17) 30mo ago (07/28/17) 46yr ago (08/03/16) 41yr ago (04/27/16)  Sodium 135 - 145 mmol/L 141  134Low   137  138  137  133Low  R   Potassium 3.5 - 5.1 mmol/L 3.0Low   3.5  4.1  3.5  4.1  3.8 R   Chloride 98 - 111 mmol/L 107  100  106  106  104 CM 101 R   CO2 22 - 32 mmol/L 20Low   24  23  20Low   18Low   22 R   Glucose, Bld 70 - 99 mg/dL 152High   201High   175High   193High   157High  CM 215High     BUN 8 - 23 mg/dL 19  22  28High   23  22 CM 24High  R   Creatinine, Ser 0.44 - 1.00 mg/dL 0.88  0.82  0.95  0.83  0.95  0.83 R   Calcium 8.9 - 10.3 mg/dL 9.0  9.2  9.3  9.0  9.6  9.8 R   Total Protein 6.5 - 8.1 g/dL 7.1  7.4  7.3  7.4  8.4High   7.7 R   Albumin 3.5 - 5.0 g/dL 3.9  3.8  4.2  4.2  4.6  4.5 R   AST 15 - 41 U/L 38  43High   33  42High   47High   33 R   ALT 0 - 44 U/L 30  34  28  34  42 CM 37High  R   Alkaline Phosphatase 38 - 126 U/L 85  86  94  95  131High   108 R 144High  R  Total Bilirubin 0.3 - 1.2 mg/dL 0.7  1.1  1.0  0.9  1.1  0.9 R   GFR calc non Af Amer >60 mL/min >60  >60  54Low   >60  54Low      GFR calc Af Amer >60 mL/min >60  >60 CM >60 CM >60 CM >60 CM    Anion gap 5 - 15 14  10  CM 8 CM 12 CM 15 CM    Comment: Performed at [facility], [address] [place], [place] [address]  Resulting Agency  [facility] CLIN LAB [place] CLIN LAB [place] CLIN LAB [place] CLIN LAB [place] CLIN LAB [place] [facility]      Specimen Collected: 01/16/18 10:43  Last Resulted: 01/16/18 11:05

## 2018-01-17 NOTE — Progress Notes (Signed)
Hematology/Oncology Consult note [facility]  [phone] [phone]  Patient Care Team: Burnard Hawthorne, FNP as PCP - General (Family Medicine)   Name of the patient: Kathy Mckay  [id number]  [birth year]   Date of visit: 01/17/18  Diagnosis- stage IV invasive mammary carcinoma of the left breast cT4b cN1 M1 with lung metastases ER PR positive HER-2/neu negative  Chief complaint/ Reason for visit-routine follow-up of breast cancer on Ibrance and tamoxifen  Heme/Onc history: patient is a 85 year old female with developmental disorder. She is a ward of the state and lives in a group home. She is here with her caregiver Ms. Tyree. She has been at the group home for the last 4 years. She does not have any living family other than a distant cousin. Her assigned social worker Ms. Glennon Mac is her healthcare proxy. Her past medical history is significant for hypertension hyperlipidemia and osteoporosis among other medical problems. Her caregiver states that overall this patient is a happy-go-lucky person and has been doing well at her nursing home. She has not had any recent falls or hospitalizations. She recently underwent a screening mammogram which picked up a mass in her left breast. Patient has not had a prior mammogram before this for years.  Mammogram and ultrasound showed 2.9 x 2.8 x 3 cm mass in the left retroareolar region. There is extension into the skin and nipple areole or complex. Associated skin thickening. At least 2 abnormal appearing left axillary lymph nodes with cortical thickening up to 1 cm.  Core biopsy of the breast mass and the axillary lymph node revealed invasive mammary carcinoma, grade 2 ER Greater than 90% and PR 11-50% positive and HER-2/neu negative. Extracapsular extension was noted in the lymph node biopsy.  Patient herself is unable to understand her ongoing medical issues she denies any complaints today. Caregiver reports  that her appetite is good. And she has not had any unintentional weight loss  CT chest abdomen and pelvis showed left breast mass, suspicious left axillary and internal mammary adenopathy along with bilateral pulmonary nodules measuring up to 16 mm in diameter consistent with metastatic disease. No evidence of bone metastases on bone scan  Patient was started on ibrance 100 mg. She could not tolerate the dose due to refractory nausea and dose was reduced to 75 mg.  Baseline tumor marker is not elevated.   Interval history-she is here with her caregiver today.  She has had some mild nausea over the last 2 days but feels better this morning.  Overall her appetite is good and her weight is stable.  Denies any new complaints today.  ECOG PS- 1 Pain scale- 0   Review of systems- Review of Systems  Constitutional: Negative for chills, fever, malaise/fatigue and weight loss.  HENT: Negative for congestion, ear discharge and nosebleeds.   Eyes: Negative for blurred vision.  Respiratory: Negative for cough, hemoptysis, sputum production, shortness of breath and wheezing.   Cardiovascular: Negative for chest pain, palpitations, orthopnea and claudication.  Gastrointestinal: Negative for abdominal pain, blood in stool, constipation, diarrhea, heartburn, melena, nausea and vomiting.  Genitourinary: Negative for dysuria, flank pain, frequency, hematuria and urgency.  Musculoskeletal: Negative for back pain, joint pain and myalgias.  Skin: Negative for rash.  Neurological: Negative for dizziness, tingling, focal weakness, seizures, weakness and headaches.  Endo/Heme/Allergies: Does not bruise/bleed easily.  Psychiatric/Behavioral: Negative for depression and suicidal ideas. The patient does not have insomnia.  No Known Allergies   Past Medical History:  Diagnosis Date  . Developmental delay   . GERD (gastroesophageal reflux disease)   . Hyperlipidemia   . Hypertension   . Osteoporosis        Past Surgical History:  Procedure Laterality Date  . ABDOMINAL HYSTERECTOMY    . BREAST BIOPSY Left 07/19/2017   invasive mammary carcinoma  . BREAST BIOPSY Left 07/19/2017   invasive mammary carcinoma  . SHOULDER SURGERY      Social History   Socioeconomic History  . Marital status: Single    Spouse name: Not on file  . Number of children: Not on file  . Years of education: Not on file  . Highest education level: Not on file  Occupational History  . Not on file  Social Needs  . Financial resource strain: Not on file  . Food insecurity:    Worry: Not on file    Inability: Not on file  . Transportation needs:    Medical: Not on file    Non-medical: Not on file  Tobacco Use  . Smoking status: Never Smoker  . Smokeless tobacco: Never Used  Substance and Sexual Activity  . Alcohol use: No  . Drug use: No  . Sexual activity: Never  Lifestyle  . Physical activity:    Days per week: Not on file    Minutes per session: Not on file  . Stress: Not on file  Relationships  . Social connections:    Talks on phone: Not on file    Gets together: Not on file    Attends religious service: Not on file    Active member of club or organization: Not on file    Attends meetings of clubs or organizations: Not on file    Relationship status: Not on file  . Intimate partner violence:    Fear of current or ex partner: Not on file    Emotionally abused: Not on file    Physically abused: Not on file    Forced sexual activity: Not on file  Other Topics Concern  . Not on file  Social History Narrative   Lives at a family care home. No living relatives. Guardian of the state.      Lives [facility] for past 6 years.     Family History  Problem Relation Age of Onset  . Diabetes Mother   . Diabetes Father   . Cancer Sister      Current Outpatient Medications:  .  amLODipine (NORVASC) 10 MG tablet, TAKE 1 TABLET BY MOUTH DAILY, Disp: 90 tablet, Rfl: 1 .  atorvastatin  (LIPITOR) 40 MG tablet, TAKE 1 TABLET BY MOUTH ONCE DAILY, Disp: 90 tablet, Rfl: 1 .  esomeprazole (NEXIUM) 20 MG capsule, Take 1 capsule (20 mg total) by mouth daily. TAKE 1 CAPSULE BY MOUTH ONCE DAILY AT 8 AM, Disp: 90 capsule, Rfl: 1 .  HYDROcodone-acetaminophen (NORCO/VICODIN) 5-325 MG tablet, TAKE 1 TABLET BY MOUTH EVERY 6 HOURS AS NEEDED FOR UP TO 5 DAYS(PAIN). MAX OF 300MG OF ACETAMINOPHEN IN 24 HOURS, Disp: 30 tablet, Rfl: 0 .  Menthol, Topical Analgesic, (BIOFREEZE) 4 % GEL, 1 application to painful area every 4 hours as needed, Disp: 89 mL, Rfl: 2 .  mirtazapine (REMERON) 45 MG tablet, TAKE 1 TABLET BY MOUTH AT BEDTIME, Disp: 90 tablet, Rfl: 3 .  prochlorperazine (COMPAZINE) 10 MG tablet, TAKE 1 TABLET BY MOUTH EVERY 8 HOURS AS NEEDED FOR NAUSEA OR VOMITING., Disp: 30  tablet, Rfl: 0 .  tamoxifen (NOLVADEX) 20 MG tablet, TAKE 1 TABLET BY MOUTH ONCE DAILY, Disp: 30 tablet, Rfl: 3 .  IBRANCE 75 MG capsule, TAKE 1 CAPSULE (75 MG TOTAL) BY MOUTH DAILY WITH BREAKFAST. TAKE WHOLE WITH FOOD. TAKE FOR 21 DAYS ON, 7 DAYS OFF, REPEAT EVERY 28 DAYS., Disp: 21 capsule, Rfl: 1  Physical exam:  Vitals:   01/16/18 1117 01/16/18 1123  BP:  133/79  Pulse:  93  Resp: 16   Temp:  (!) 97.2 F (36.2 C)  TempSrc:  Tympanic  Weight: 83 lb 14.4 oz (38.1 kg)   Height: '4\' 4"'  (1.321 m)    Physical Exam Constitutional:      Comments: Elderly female sitting in a wheelchair.  Appears in no acute distress  HENT:     Head: Normocephalic and atraumatic.  Eyes:     Pupils: Pupils are equal, round, and reactive to light.  Neck:     Musculoskeletal: Normal range of motion.  Cardiovascular:     Rate and Rhythm: Normal rate and regular rhythm.     Heart sounds: Normal heart sounds.  Pulmonary:     Effort: Pulmonary effort is normal.     Breath sounds: Normal breath sounds.  Abdominal:     General: Bowel sounds are normal.     Palpations: Abdomen is soft.  Skin:    General: Skin is warm and dry.    Neurological:     Mental Status: She is alert.     Comments: Oriented to self   Left breast mass appears mildly smaller in size.  Skin changes especially over her areole and nipple appear improved.  CMP Latest Ref Rng & Units 01/16/2018  Glucose 70 - 99 mg/dL 152(H)  BUN 8 - 23 mg/dL 19  Creatinine 0.44 - 1.00 mg/dL 0.88  Sodium 135 - 145 mmol/L 141  Potassium 3.5 - 5.1 mmol/L 3.0(L)  Chloride 98 - 111 mmol/L 107  CO2 22 - 32 mmol/L 20(L)  Calcium 8.9 - 10.3 mg/dL 9.0  Total Protein 6.5 - 8.1 g/dL 7.1  Total Bilirubin 0.3 - 1.2 mg/dL 0.7  Alkaline Phos 38 - 126 U/L 85  AST 15 - 41 U/L 38  ALT 0 - 44 U/L 30   CBC Latest Ref Rng & Units 01/16/2018  WBC 4.0 - 10.5 K/uL 4.0  Hemoglobin 12.0 - 15.0 g/dL 14.6  Hematocrit 36.0 - 46.0 % 42.8  Platelets 150 - 400 K/uL 142(L)      Assessment and plan- Patient is a 85 y.o. female  withinvasive mammary carcinoma of the left breast stage IV cT4b cN1 M1 ER PR positive HER-2/neu negative with lung metastases.    She is here for routine follow-up of breast cancer on Ibrance and letrozole  Patient is currently in the middle of her cycle and will finish her next cycle of tomorrow followed by a week off.  She will start her cycle 6 of Ibrance on 01/24/2018 which she will finish on 02/14/2018.  I will see her back on 02/19/2018 prior to starting cycle 8 of Ibrance with a CBC with differential and a CMP.  I will plan to obtain scans after 8 cycles.  Clinically she seems to be doing well as her breast mass as well as skin changes appear improved.  Patient will also continue to take her tamoxifen daily without any breaks   Visit Diagnosis 1. Malignant neoplasm of left breast in female, estrogen receptor positive, unspecified site of breast ([place])  2. High risk medication use      Dr. Randa Evens, MD, MPH [facility] [phone] 01/17/2018 11:42 AM

## 2018-01-22 ENCOUNTER — Telehealth: Payer: Self-pay | Admitting: Pharmacy Technician

## 2018-01-22 MED FILL — IBRANCE 75 MG CAPSULE: 75 | 28 days supply | Qty: 21 | Fill #0

## 2018-01-22 NOTE — Telephone Encounter (Signed)
Oral Oncology Patient Advocate Encounter   Was successful in securing patient an $77 grant from Patient [facility]) to provide copayment coverage for Ibrance.  This will keep the out of pocket expense at $0.     The billing information is as follows and has been shared with [place].   Member ID: [id number] Group ID: [id number] RxBin: [id number] Dates of Eligibility: 10/24/17 through 01/22/2019  [place] [facility] [place] Phone [phone] Fax [phone] 01/22/2018 9:56 AM

## 2018-01-23 DIAGNOSIS — I739 Peripheral vascular disease, unspecified: Secondary | ICD-10-CM | POA: Diagnosis not present

## 2018-01-23 DIAGNOSIS — B351 Tinea unguium: Secondary | ICD-10-CM | POA: Diagnosis not present

## 2018-01-23 DIAGNOSIS — M79674 Pain in right toe(s): Secondary | ICD-10-CM | POA: Diagnosis not present

## 2018-01-23 DIAGNOSIS — L84 Corns and callosities: Secondary | ICD-10-CM | POA: Diagnosis not present

## 2018-01-23 DIAGNOSIS — M2041 Other hammer toe(s) (acquired), right foot: Secondary | ICD-10-CM | POA: Diagnosis not present

## 2018-02-09 ENCOUNTER — Ambulatory Visit (INDEPENDENT_AMBULATORY_CARE_PROVIDER_SITE_OTHER): Payer: Medicare Other | Admitting: Family

## 2018-02-09 ENCOUNTER — Encounter: Payer: Self-pay | Admitting: Family

## 2018-02-09 DIAGNOSIS — M810 Age-related osteoporosis without current pathological fracture: Secondary | ICD-10-CM

## 2018-02-09 DIAGNOSIS — I10 Essential (primary) hypertension: Secondary | ICD-10-CM | POA: Diagnosis not present

## 2018-02-09 DIAGNOSIS — Z170 Estrogen receptor positive status [ER+]: Secondary | ICD-10-CM

## 2018-02-09 DIAGNOSIS — K219 Gastro-esophageal reflux disease without esophagitis: Secondary | ICD-10-CM

## 2018-02-09 DIAGNOSIS — Z23 Encounter for immunization: Secondary | ICD-10-CM | POA: Diagnosis not present

## 2018-02-09 DIAGNOSIS — C50912 Malignant neoplasm of unspecified site of left female breast: Secondary | ICD-10-CM

## 2018-02-09 NOTE — Assessment & Plan Note (Signed)
Controlled. No changes  Made today

## 2018-02-09 NOTE — Progress Notes (Signed)
Subjective:    Patient ID: Kathy Mckay, female    DOB: 14-Jun-1932, 85 y.o.   MRN: [id number]  CC: Kathy Mckay is a 85 y.o. female who presents today for follow up.   HPI: Accompanied by caregiver whom shares no concerns today. Desires flu shot.   HTN- compliant with medications.   GERD- doing well on nexium.  No trouble swallowing, decreased appetite  Osteoporosis-no known treatment in the past.  Following with Dr Kathy Mckay- doing oral chemo, Ibrance.  Tolerating medication well. Weight is up.    HISTORY:  Past Medical History:  Diagnosis Date  . Developmental delay   . GERD (gastroesophageal reflux disease)   . Hyperlipidemia   . Hypertension   . Osteoporosis    Past Surgical History:  Procedure Laterality Date  . ABDOMINAL HYSTERECTOMY    . BREAST BIOPSY Left 07/19/2017   invasive mammary carcinoma  . BREAST BIOPSY Left 07/19/2017   invasive mammary carcinoma  . SHOULDER SURGERY     Family History  Problem Relation Age of Onset  . Diabetes Mother   . Diabetes Father   . Cancer Sister     Allergies: Patient has no known allergies. Current Outpatient Medications on File Prior to Visit  Medication Sig Dispense Refill  . amLODipine (NORVASC) 10 MG tablet TAKE 1 TABLET BY MOUTH DAILY 90 tablet 1  . atorvastatin (LIPITOR) 40 MG tablet TAKE 1 TABLET BY MOUTH ONCE DAILY 90 tablet 1  . esomeprazole (NEXIUM) 20 MG capsule Take 1 capsule (20 mg total) by mouth daily. TAKE 1 CAPSULE BY MOUTH ONCE DAILY AT 8 AM 90 capsule 1  . IBRANCE 75 MG capsule TAKE 1 CAPSULE (75 MG TOTAL) BY MOUTH DAILY WITH BREAKFAST. TAKE WHOLE WITH FOOD. TAKE FOR 21 DAYS ON, 7 DAYS OFF, REPEAT EVERY 28 DAYS. 21 capsule 1  . Menthol, Topical Analgesic, (BIOFREEZE) 4 % GEL 1 application to painful area every 4 hours as needed 89 mL 2  . mirtazapine (REMERON) 45 MG tablet TAKE 1 TABLET BY MOUTH AT BEDTIME 90 tablet 3  . prochlorperazine (COMPAZINE) 10 MG tablet TAKE 1 TABLET BY MOUTH EVERY 8 HOURS AS  NEEDED FOR NAUSEA OR VOMITING. 30 tablet 0  . tamoxifen (NOLVADEX) 20 MG tablet TAKE 1 TABLET BY MOUTH ONCE DAILY 30 tablet 3   No current facility-administered medications on file prior to visit.     Social History   Tobacco Use  . Smoking status: Never Smoker  . Smokeless tobacco: Never Used  Substance Use Topics  . Alcohol use: No  . Drug use: No    Review of Systems  Constitutional: Negative for chills and fever.  Respiratory: Negative for cough.   Cardiovascular: Negative for chest pain and palpitations.  Gastrointestinal: Negative for nausea and vomiting.      Objective:    BP 132/84 (BP Location: Left Arm, Patient Position: Sitting, Cuff Size: Normal)   Pulse (!) 102   Temp (!) 97.4 F (36.3 C)   Wt 91 lb 12.8 oz (41.6 kg)   SpO2 94%   BMI 23.87 kg/m  BP Readings from Last 3 Encounters:  02/09/18 132/84  01/16/18 133/79  11/30/17 138/85   Wt Readings from Last 3 Encounters:  02/09/18 91 lb 12.8 oz (41.6 kg)  01/16/18 83 lb 14.4 oz (38.1 kg)  11/30/17 89 lb (40.4 kg)    Physical Exam Vitals signs reviewed.  Constitutional:      Appearance: She is well-developed.  Eyes:  Conjunctiva/sclera: Conjunctivae normal.  Cardiovascular:     Rate and Rhythm: Normal rate and regular rhythm.     Pulses: Normal pulses.     Heart sounds: Normal heart sounds.  Pulmonary:     Effort: Pulmonary effort is normal.     Breath sounds: Normal breath sounds. No wheezing, rhonchi or rales.  Skin:    General: Skin is warm and dry.  Neurological:     Mental Status: She is alert.  Psychiatric:        Speech: Speech normal.        Behavior: Behavior normal.        Thought Content: Thought content normal.        Assessment & Plan:   Problem List Items Addressed This Visit      Cardiovascular and Mediastinum   Hypertension    Controlled. No changes  Made today        Digestive   Gastroesophageal reflux disease    Stable. Continue regimen.          Musculoskeletal and Integument   Osteoporosis    Not currently being treated, messaged her oncologist, Dr. Janese Mckay with advice in regards to this with current malignancy        Other   Malignant neoplasm of left breast in female, estrogen receptor positive ([place])    Currently on oral chemo, will continue to follow.       Other Visit Diagnoses    Encounter for immunization       Relevant Orders   Flu vaccine HIGH DOSE PF (Completed)       I have discontinued [name]. Wile's HYDROcodone-acetaminophen. I am also having her maintain her amLODipine, atorvastatin, prochlorperazine, Menthol (Topical Analgesic), tamoxifen, esomeprazole, mirtazapine, and IBRANCE.   No orders of the defined types were placed in this encounter.   Return precautions given.   Risks, benefits, and alternatives of the medications and treatment plan prescribed today were discussed, and patient expressed understanding.   Education regarding symptom management and diagnosis given to patient on AVS.  Continue to follow with Burnard Hawthorne, FNP for routine health maintenance.   [name] and I agreed with plan.   Mable Paris, FNP

## 2018-02-09 NOTE — Assessment & Plan Note (Signed)
Stable. Continue regimen. 

## 2018-02-09 NOTE — Assessment & Plan Note (Signed)
Not currently being treated, messaged her oncologist, Dr. [name] with advice in regards to this with current malignancy

## 2018-02-09 NOTE — Patient Instructions (Signed)
Nice to see you!   

## 2018-02-09 NOTE — Assessment & Plan Note (Signed)
Currently on oral chemo, will continue to follow.

## 2018-02-12 ENCOUNTER — Telehealth: Payer: Self-pay | Admitting: Family

## 2018-02-12 NOTE — Telephone Encounter (Signed)
Call caregiver  I got a note back from Dr [name] in regards to patient's osteoporosis. Tamoxifen can have a postive effect on bone health. Her dexa was in 2019- we will pursue again in 2021 and if we see a decrease can discuss medications at that time if caregiver agrees with this plan

## 2018-02-12 NOTE — Telephone Encounter (Signed)
I called & spoke with patient's caregiver to advise her on below.

## 2018-02-15 MED FILL — IBRANCE 75 MG CAPSULE: 75 | 28 days supply | Qty: 21 | Fill #1

## 2018-02-19 ENCOUNTER — Inpatient Hospital Stay: Payer: Medicare Other

## 2018-02-19 ENCOUNTER — Inpatient Hospital Stay: Payer: Medicare Other | Admitting: Oncology

## 2018-02-23 ENCOUNTER — Inpatient Hospital Stay: Payer: Medicare Other | Attending: Oncology

## 2018-02-23 ENCOUNTER — Inpatient Hospital Stay (HOSPITAL_BASED_OUTPATIENT_CLINIC_OR_DEPARTMENT_OTHER): Payer: Medicare Other | Admitting: Oncology

## 2018-02-23 ENCOUNTER — Encounter: Payer: Self-pay | Admitting: Oncology

## 2018-02-23 VITALS — BP 123/81 | HR 99 | Temp 97.5°F | Resp 16 | Wt 83.1 lb

## 2018-02-23 DIAGNOSIS — T50905A Adverse effect of unspecified drugs, medicaments and biological substances, initial encounter: Secondary | ICD-10-CM

## 2018-02-23 DIAGNOSIS — Z79899 Other long term (current) drug therapy: Secondary | ICD-10-CM

## 2018-02-23 DIAGNOSIS — C50912 Malignant neoplasm of unspecified site of left female breast: Secondary | ICD-10-CM | POA: Diagnosis not present

## 2018-02-23 DIAGNOSIS — D72819 Decreased white blood cell count, unspecified: Secondary | ICD-10-CM

## 2018-02-23 DIAGNOSIS — D702 Other drug-induced agranulocytosis: Secondary | ICD-10-CM

## 2018-02-23 DIAGNOSIS — I10 Essential (primary) hypertension: Secondary | ICD-10-CM | POA: Insufficient documentation

## 2018-02-23 DIAGNOSIS — C7800 Secondary malignant neoplasm of unspecified lung: Secondary | ICD-10-CM | POA: Diagnosis not present

## 2018-02-23 DIAGNOSIS — D696 Thrombocytopenia, unspecified: Secondary | ICD-10-CM | POA: Insufficient documentation

## 2018-02-23 DIAGNOSIS — K219 Gastro-esophageal reflux disease without esophagitis: Secondary | ICD-10-CM | POA: Insufficient documentation

## 2018-02-23 DIAGNOSIS — Z170 Estrogen receptor positive status [ER+]: Secondary | ICD-10-CM | POA: Insufficient documentation

## 2018-02-23 DIAGNOSIS — E785 Hyperlipidemia, unspecified: Secondary | ICD-10-CM | POA: Insufficient documentation

## 2018-02-23 DIAGNOSIS — D6959 Other secondary thrombocytopenia: Secondary | ICD-10-CM

## 2018-02-23 LAB — COMPREHENSIVE METABOLIC PANEL
ALK PHOS: 72 U/L (ref 38–126)
ALT: 33 U/L (ref 0–44)
AST: 43 U/L — AB (ref 15–41)
Albumin: 3.9 g/dL (ref 3.5–5.0)
Anion gap: 10 (ref 5–15)
BILIRUBIN TOTAL: 1 mg/dL (ref 0.3–1.2)
BUN: 19 mg/dL (ref 8–23)
CALCIUM: 9 mg/dL (ref 8.9–10.3)
CO2: 23 mmol/L (ref 22–32)
CREATININE: 0.89 mg/dL (ref 0.44–1.00)
Chloride: 104 mmol/L (ref 98–111)
GFR calc Af Amer: >60 mL/min (ref >60)
GFR, EST NON AFRICAN AMERICAN: 60 mL/min — AB (ref >60)
GLUCOSE: 162 mg/dL — AB (ref 70–99)
POTASSIUM: 3.6 mmol/L (ref 3.5–5.1)
Sodium: 137 mmol/L (ref 135–145)
TOTAL PROTEIN: 7.4 g/dL (ref 6.5–8.1)

## 2018-02-23 LAB — CBC WITH DIFFERENTIAL/PLATELET
Abs Immature Granulocytes: 0.02 10*3/uL (ref 0.00–0.07)
BASOS PCT: 1 %
Basophils Absolute: 0 10*3/uL (ref 0.0–0.1)
EOS ABS: 0 10*3/uL (ref 0.0–0.5)
EOS PCT: 0 %
HCT: 42.4 % (ref 36.0–46.0)
Hemoglobin: 14.5 g/dL (ref 12.0–15.0)
Immature Granulocytes: 1 %
Lymphocytes Relative: 12 %
Lymphs Abs: 0.4 10*3/uL — ABNORMAL LOW (ref 0.7–4.0)
MCH: 36.5 pg — AB (ref 26.0–34.0)
MCHC: 34.2 g/dL (ref 30.0–36.0)
MCV: 106.8 fL — ABNORMAL HIGH (ref 80.0–100.0)
MONO ABS: 0.6 10*3/uL (ref 0.1–1.0)
Monocytes Relative: 17 %
Neutro Abs: 2.4 10*3/uL (ref 1.7–7.7)
Neutrophils Relative %: 69 %
PLATELETS: 123 10*3/uL — AB (ref 150–400)
RBC: 3.97 MIL/uL (ref 3.87–5.11)
RDW: 13.1 % (ref 11.5–15.5)
WBC: 3.4 10*3/uL — AB (ref 4.0–10.5)
nRBC: 0 % (ref 0.0–0.2)

## 2018-02-23 NOTE — Progress Notes (Signed)
Pt in for follow up, facility caregiver denies any concerns today.

## 2018-02-27 ENCOUNTER — Other Ambulatory Visit: Payer: Self-pay | Admitting: *Deleted

## 2018-02-27 NOTE — Progress Notes (Signed)
Hematology/Oncology Consult note [facility]  Telephone:([phone] [phone]  Patient Care Team: Burnard Hawthorne, FNP as PCP - General (Family Medicine)   Name of the patient: Kathy Mckay  [id number]  07-06-1932   Date of visit: 02/27/18  Diagnosis- stage IV invasive mammary carcinoma of the left breast cT4b cN1 M1 with lung metastases ER PR positive HER-2/neu negative  Chief complaint/ Reason for visit-routine follow-up of breast cancer on Ibrance and tamoxifen  Heme/Onc history: patient is a 85 year old female with developmental disorder. She is a ward of the state and lives in a group home. She is here with her caregiver Ms. Tyree. She has been at the group home for the last 4 years. She does not have any living family other than a distant cousin. Her assigned social worker Ms. Glennon Mac is her healthcare proxy. Her past medical history is significant for hypertension hyperlipidemia and osteoporosis among other medical problems. Her caregiver states that overall this patient is a happy-go-lucky person and has been doing well at her nursing home. She has not had any recent falls or hospitalizations. She recently underwent a screening mammogram which picked up a mass in her left breast. Patient has not had a prior mammogram before this for years.  Mammogram and ultrasound showed 2.9 x 2.8 x 3 cm mass in the left retroareolar region. There is extension into the skin and nipple areole or complex. Associated skin thickening. At least 2 abnormal appearing left axillary lymph nodes with cortical thickening up to 1 cm.  Core biopsy of the breast mass and the axillary lymph node revealed invasive mammary carcinoma, grade 2 ER Greater than 90% and PR 11-50% positive and HER-2/neu negative. Extracapsular extension was noted in the lymph node biopsy.   CT chest abdomen and pelvis showed left breast mass, suspicious left axillary and internal mammary adenopathy along  with bilateral pulmonary nodules measuring up to 16 mm in diameter consistent with metastatic disease. No evidence of bone metastases on bone scan  Patient was started on ibrance 100 mg. She could not tolerate the dose due to refractory nausea and dose was reduced to 75 mg.  Baseline tumor marker is not elevated.  Interval history-she is here with her caregiver from group home.  Patient reports feeling well and denies any nausea or vomiting.  Caregiver reports no significant complaints at the nursing home.  She denies any nausea vomiting or fatigue  ECOG PS- 2 Pain scale- 0   Review of systems- Review of Systems  Constitutional: Negative for chills, fever, malaise/fatigue and weight loss.  HENT: Negative for congestion, ear discharge and nosebleeds.   Eyes: Negative for blurred vision.  Respiratory: Negative for cough, hemoptysis, sputum production, shortness of breath and wheezing.   Cardiovascular: Negative for chest pain, palpitations, orthopnea and claudication.  Gastrointestinal: Negative for abdominal pain, blood in stool, constipation, diarrhea, heartburn, melena, nausea and vomiting.  Genitourinary: Negative for dysuria, flank pain, frequency, hematuria and urgency.  Musculoskeletal: Negative for back pain, joint pain and myalgias.  Skin: Negative for rash.  Neurological: Negative for dizziness, tingling, focal weakness, seizures, weakness and headaches.  Endo/Heme/Allergies: Does not bruise/bleed easily.  Psychiatric/Behavioral: Negative for depression and suicidal ideas. The patient does not have insomnia.       No Known Allergies   Past Medical History:  Diagnosis Date  . Developmental delay   . GERD (gastroesophageal reflux disease)   . Hyperlipidemia   . Hypertension   . Osteoporosis  Past Surgical History:  Procedure Laterality Date  . ABDOMINAL HYSTERECTOMY    . BREAST BIOPSY Left 07/19/2017   invasive mammary carcinoma  . BREAST BIOPSY Left 07/19/2017     invasive mammary carcinoma  . SHOULDER SURGERY      Social History   Socioeconomic History  . Marital status: Single    Spouse name: Not on file  . Number of children: Not on file  . Years of education: Not on file  . Highest education level: Not on file  Occupational History  . Not on file  Social Needs  . Financial resource strain: Not on file  . Food insecurity:    Worry: Not on file    Inability: Not on file  . Transportation needs:    Medical: Not on file    Non-medical: Not on file  Tobacco Use  . Smoking status: Never Smoker  . Smokeless tobacco: Never Used  Substance and Sexual Activity  . Alcohol use: No  . Drug use: No  . Sexual activity: Never  Lifestyle  . Physical activity:    Days per week: Not on file    Minutes per session: Not on file  . Stress: Not on file  Relationships  . Social connections:    Talks on phone: Not on file    Gets together: Not on file    Attends religious service: Not on file    Active member of club or organization: Not on file    Attends meetings of clubs or organizations: Not on file    Relationship status: Not on file  . Intimate partner violence:    Fear of current or ex partner: Not on file    Emotionally abused: Not on file    Physically abused: Not on file    Forced sexual activity: Not on file  Other Topics Concern  . Not on file  Social History Narrative   Lives at a family care home. No living relatives. Guardian of the state.      Lives [place] [facility] [place] for past 6 years.     Family History  Problem Relation Age of Onset  . Diabetes Mother   . Diabetes Father   . Cancer Sister      Current Outpatient Medications:  .  amLODipine (NORVASC) 10 MG tablet, TAKE 1 TABLET BY MOUTH DAILY, Disp: 90 tablet, Rfl: 1 .  atorvastatin (LIPITOR) 40 MG tablet, TAKE 1 TABLET BY MOUTH ONCE DAILY, Disp: 90 tablet, Rfl: 1 .  esomeprazole (NEXIUM) 20 MG capsule, Take 1 capsule (20 mg total) by mouth daily. TAKE 1 CAPSULE BY  MOUTH ONCE DAILY AT 8 AM, Disp: 90 capsule, Rfl: 1 .  IBRANCE 75 MG capsule, TAKE 1 CAPSULE (75 MG TOTAL) BY MOUTH DAILY WITH BREAKFAST. TAKE WHOLE WITH FOOD. TAKE FOR 21 DAYS ON, 7 DAYS OFF, REPEAT EVERY 28 DAYS., Disp: 21 capsule, Rfl: 1 .  Menthol, Topical Analgesic, (BIOFREEZE) 4 % GEL, 1 application to painful area every 4 hours as needed, Disp: 89 mL, Rfl: 2 .  mirtazapine (REMERON) 45 MG tablet, TAKE 1 TABLET BY MOUTH AT BEDTIME, Disp: 90 tablet, Rfl: 3 .  prochlorperazine (COMPAZINE) 10 MG tablet, TAKE 1 TABLET BY MOUTH EVERY 8 HOURS AS NEEDED FOR NAUSEA OR VOMITING., Disp: 30 tablet, Rfl: 0 .  tamoxifen (NOLVADEX) 20 MG tablet, TAKE 1 TABLET BY MOUTH ONCE DAILY, Disp: 30 tablet, Rfl: 3  Physical exam:  Vitals:   02/23/18 1424  BP: 123/81  Pulse:  99  Resp: 16  Temp: (!) 97.5 F (36.4 C)  TempSrc: Tympanic  Weight: 83 lb 2 oz (37.7 kg)   Physical Exam Constitutional:      Comments: Thin frail elderly woman sitting in a wheelchair.  Appears in no acute distress  HENT:     Head: Normocephalic and atraumatic.  Eyes:     Pupils: Pupils are equal, round, and reactive to light.  Neck:     Musculoskeletal: Normal range of motion.  Cardiovascular:     Rate and Rhythm: Normal rate and regular rhythm.     Heart sounds: Normal heart sounds.  Pulmonary:     Effort: Pulmonary effort is normal.     Breath sounds: Normal breath sounds.  Abdominal:     General: Bowel sounds are normal.     Palpations: Abdomen is soft.  Skin:    General: Skin is warm and dry.  Neurological:     Mental Status: She is alert.     Comments: Oriented to self   Left breast mass appears smaller in the satellite lesions have also improved.  There is no skin breakdown or ulceration  CMP Latest Ref Rng & Units 02/23/2018  Glucose 70 - 99 mg/dL 162(H)  BUN 8 - 23 mg/dL 19  Creatinine 0.44 - 1.00 mg/dL 0.89  Sodium 135 - 145 mmol/L 137  Potassium 3.5 - 5.1 mmol/L 3.6  Chloride 98 - 111 mmol/L 104  CO2 22  - 32 mmol/L 23  Calcium 8.9 - 10.3 mg/dL 9.0  Total Protein 6.5 - 8.1 g/dL 7.4  Total Bilirubin 0.3 - 1.2 mg/dL 1.0  Alkaline Phos 38 - 126 U/L 72  AST 15 - 41 U/L 43(H)  ALT 0 - 44 U/L 33   CBC Latest Ref Rng & Units 02/23/2018  WBC 4.0 - 10.5 K/uL 3.4(L)  Hemoglobin 12.0 - 15.0 g/dL 14.5  Hematocrit 36.0 - 46.0 % 42.4  Platelets 150 - 400 K/uL 123(L)      Assessment and plan- Patient is a 86 y.o. female withinvasive mammary carcinoma of the left breast stage IV cT4b cN1 M1 ER PR positive HER-2/neu negative with lung metastases. She is here for routine follow-up of breast cancer on Ibrance and letrozole  Patient is tolerating Ibrance at reduced dose of 75 mg along with tamoxifen well without any significant side effects.  She has mild leukopenia but her ANC is greater than 1.  She can continue Ibrance at current dose.  Mild thrombocytopenia also likely secondary to Ibrance continue to monitor.  I will repeat CT chest abdomen pelvis as well as bone scans in 1 month's time and see her thereafter with CBC with differential, CMP, CA-27-29 and CA-15-3.  Clinically she appears to be responding to this combination and her primary breast mass appears smaller in size.     Visit Diagnosis 1. High risk medication use   2. Malignant neoplasm metastatic to lung, unspecified laterality ([place])   3. Drug-induced thrombocytopenia   4. Drug-induced leukopenia (H[facility])      Dr. Randa Evens, MD, MPH [facility] at [facility] [phone] 02/27/2018 8:49 AM

## 2018-03-12 ENCOUNTER — Other Ambulatory Visit: Payer: Self-pay | Admitting: Family

## 2018-03-12 ENCOUNTER — Other Ambulatory Visit: Payer: Self-pay | Admitting: Oncology

## 2018-03-14 ENCOUNTER — Other Ambulatory Visit: Payer: Self-pay | Admitting: Oncology

## 2018-03-14 NOTE — Telephone Encounter (Signed)
...   Ref Range & Units 2wk ago (02/23/18) 49mo ago (01/16/18) 46mo ago (09/21/17) 30mo ago (09/07/17) 70mo ago (08/21/17) 96mo ago (07/28/17) 28yr ago (08/03/16)  Sodium 135 - 145 mmol/L 137  141  134Low   137  138  137  133Low  R  Potassium 3.5 - 5.1 mmol/L 3.6  3.0Low   3.5  4.1  3.5  4.1  3.8 R  Chloride 98 - 111 mmol/L 104  107  100  106  106  104 CM 101 R  CO2 22 - 32 mmol/L 23  20Low   24  23  20Low   18Low   22 R  Glucose, Bld 70 - 99 mg/dL 162High   152High   201High   175High   193High   157High  CM 215High    BUN 8 - 23 mg/dL 19  19  22   28High   23  22 CM 24High  R  Creatinine, Ser 0.44 - 1.00 mg/dL 0.89  0.88  0.82  0.95  0.83  0.95  0.83 R  Calcium 8.9 - 10.3 mg/dL 9.0  9.0  9.2  9.3  9.0  9.6  9.8 R  Total Protein 6.5 - 8.1 g/dL 7.4  7.1  7.4  7.3  7.4  8.4High   7.7 R  Albumin 3.5 - 5.0 g/dL 3.9  3.9  3.8  4.2  4.2  4.6  4.5 R  AST 15 - 41 U/L 43High   38  43High   33  42High   47High   33 R  ALT 0 - 44 U/L 33  30  34  28  34  42 CM 37High  R  Alkaline Phosphatase 38 - 126 U/L 72  85  86  94  95  131High   108 R  Total Bilirubin 0.3 - 1.2 mg/dL 1.0  0.7  1.1  1.0  0.9  1.1  0.9 R  GFR calc non Af Amer >60 mL/min 60Low   >60  >60  54Low   >60  54Low     GFR calc Af Amer >60 mL/min >60  >60  >60 CM >60 CM >60 CM >60 CM   Anion gap 5 - 15 10  14  CM 10 CM 8 CM 12 CM 15 CM   Comment: Performed at [facility], [address] [place],  [address]  Resulting Agency  [facility] CLIN LAB [place] CLIN LAB [place] CLIN LAB [place] CLIN LAB [place] CLIN LAB [place] CLIN LAB [place] [facility]      Specimen Collected: 02/23/18 13:59  Last Resulted: 02/23/18 14:39       ...   Ref Range & Units 2wk ago (02/23/18) 43mo ago (01/16/18) 55mo ago (11/30/17) 29mo ago (11/16/17) 34mo ago (10/27/17) 70mo ago (09/21/17) 62mo ago (09/07/17)  WBC 4.0 - 10.5 K/uL 3.4Low   4.0  5.3  3.1Low   2.6Low   4.4 R 2.9Low  R  RBC 3.87 - 5.11 MIL/uL 3.97  3.97  4.05  4.00  4.32  4.39 R 4.61 R  Hemoglobin 12.0 - 15.0 g/dL 14.5  14.6   14.3  14.2  14.9  14.9 R 15.6 R  HCT 36.0 - 46.0 % 42.4  42.8  43.2  41.3  44.1  43.6 R 46.0 R  MCV 80.0 - 100.0 fL 106.8High   107.8High   106.7High   103.3High   102.1High   99.3  99.9   MCH 26.0 - 34.0 pg  36.5High   36.8High   35.3High   35.5High   34.5High   34.0  33.9   MCHC 30.0 - 36.0 g/dL 34.2  34.1  33.1  34.4  33.8  34.3 R 33.9 R  RDW 11.5 - 15.5 % 13.1  13.4  15.9High   16.2High   16.5High   14.5 R 13.6 R  Platelets 150 - 400 K/uL 123Low   142Low   179  185  90Low   241 R 99Low  R  nRBC 0.0 - 0.2 % 0.0  0.0  0.0  0.0  0.0     Neutrophils Relative % % 69  64  77  67  61  77  79   Neutro Abs 1.7 - 7.7 K/uL 2.4  2.6  4.1  2.1  1.6Low   3.4 R 2.3 R  Lymphocytes Relative % 12  26  10  21  30  9  14    Lymphs Abs 0.7 - 4.0 K/uL 0.4Low   1.1  0.6Low   0.7  0.8  0.4Low  R 0.4Low  R  Monocytes Relative % 17  7  11  10  7  13  6    Monocytes Absolute 0.1 - 1.0 K/uL 0.6  0.3  0.6  0.3  0.2  0.6 R 0.2 R  Eosinophils Relative % 0  1  0  1  1  0  0   Eosinophils Absolute 0.0 - 0.5 K/uL 0.0  0.0  0.0  0.0  0.0  0.0 R 0.0 R  Basophils Relative % 1  1  2  1  1  1  1    Basophils Absolute 0.0 - 0.1 K/uL 0.0  0.0  0.1  0.0  0.0  0.0 R, CM 0.0 R, CM  Immature Granulocytes % 1  1  0  0  0     Abs Immature Granulocytes 0.00 - 0.07 K/uL 0.02  0.03 CM 0.02 CM 0.01 CM 0.01 CM    Comment: Performed at [facility], [address] [place], [place] [address]  Resulting Agency  [facility] CLIN LAB [place] CLIN LAB [place] CLIN LAB [place] CLIN LAB [place] CLIN LAB [place] CLIN LAB [place] CLIN LAB      Specimen Collected: 02/23/18 13:59  Last Resulted: 02/23/18 14:26

## 2018-03-20 MED FILL — IBRANCE 75 MG CAPSULE: 75 | 28 days supply | Qty: 21 | Fill #0

## 2018-03-21 ENCOUNTER — Ambulatory Visit
Admission: RE | Admit: 2018-03-21 | Discharge: 2018-03-21 | Disposition: A | Discharge status: Home / Self Care | Payer: Medicare Other | Source: Ambulatory Visit | Attending: Oncology | Admitting: Oncology

## 2018-03-21 ENCOUNTER — Other Ambulatory Visit: Payer: Self-pay

## 2018-03-21 DIAGNOSIS — I700 Atherosclerosis of aorta: Secondary | ICD-10-CM | POA: Insufficient documentation

## 2018-03-21 DIAGNOSIS — Z79899 Other long term (current) drug therapy: Secondary | ICD-10-CM | POA: Diagnosis not present

## 2018-03-21 DIAGNOSIS — R918 Other nonspecific abnormal finding of lung field: Secondary | ICD-10-CM | POA: Diagnosis not present

## 2018-03-21 DIAGNOSIS — C7800 Secondary malignant neoplasm of unspecified lung: Secondary | ICD-10-CM | POA: Insufficient documentation

## 2018-03-21 DIAGNOSIS — C50919 Malignant neoplasm of unspecified site of unspecified female breast: Secondary | ICD-10-CM | POA: Diagnosis not present

## 2018-03-21 DIAGNOSIS — C50912 Malignant neoplasm of unspecified site of left female breast: Secondary | ICD-10-CM | POA: Diagnosis not present

## 2018-03-21 DIAGNOSIS — R911 Solitary pulmonary nodule: Secondary | ICD-10-CM | POA: Diagnosis not present

## 2018-03-21 DIAGNOSIS — I2510 Atherosclerotic heart disease of native coronary artery without angina pectoris: Secondary | ICD-10-CM | POA: Diagnosis not present

## 2018-03-21 HISTORY — DX: Malignant (primary) neoplasm, unspecified: C80.1

## 2018-03-21 MED ORDER — IOHEXOL 300 MG/ML  SOLN
75.0000 mL | Freq: Once | INTRAMUSCULAR | Status: AC | PRN
  Administered 2018-03-21: 60 mL via INTRAVENOUS

## 2018-03-23 ENCOUNTER — Inpatient Hospital Stay: Payer: Medicare Other | Admitting: Oncology

## 2018-03-23 ENCOUNTER — Inpatient Hospital Stay: Payer: Medicare Other

## 2018-03-29 ENCOUNTER — Inpatient Hospital Stay: Payer: Medicare Other

## 2018-03-29 ENCOUNTER — Inpatient Hospital Stay: Payer: Medicare Other | Admitting: Oncology

## 2018-04-11 MED FILL — IBRANCE 75 MG CAPSULE: 75 | 28 days supply | Qty: 21 | Fill #1

## 2018-05-07 ENCOUNTER — Other Ambulatory Visit: Payer: Self-pay | Admitting: Oncology

## 2018-05-07 NOTE — Telephone Encounter (Signed)
CBC with Differential/Platelet  Order: [id number]  Status:  Final result  Visible to patient:  No (Not Released)  Next appt:  05/15/2018 at 10:00 AM in Oncology ([facility])  Dx:  Malignant neoplasm of left breast in ...   Ref Range & Units 43mo ago (02/23/18) 78mo ago (01/16/18) 8mo ago (11/30/17) 86mo ago (11/16/17) 67mo ago (10/27/17) 6mo ago (09/21/17) 26mo ago (09/07/17)  WBC 4.0 - 10.5 K/uL 3.4Low   4.0  5.3  3.1Low   2.6Low   4.4 R 2.9Low  R  RBC 3.87 - 5.11 MIL/uL 3.97  3.97  4.05  4.00  4.32  4.39 R 4.61 R  Hemoglobin 12.0 - 15.0 g/dL 14.5  14.6  14.3  14.2  14.9  14.9 R 15.6 R  HCT 36.0 - 46.0 % 42.4  42.8  43.2  41.3  44.1  43.6 R 46.0 R  MCV 80.0 - 100.0 fL 106.8High   107.8High   106.7High   103.3High   102.1High   99.3  99.9   MCH 26.0 - 34.0 pg 36.5High   36.8High   35.3High   35.5High   34.5High   34.0  33.9   MCHC 30.0 - 36.0 g/dL 34.2  34.1  33.1  34.4  33.8  34.3 R 33.9 R  RDW 11.5 - 15.5 % 13.1  13.4  15.9High   16.2High   16.5High   14.5 R 13.6 R  Platelets 150 - 400 K/uL 123Low   142Low   179  185  90Low   241 R 99Low  R  nRBC 0.0 - 0.2 % 0.0  0.0  0.0  0.0  0.0     Neutrophils Relative % % 69  64  77  67  61  77  79   Neutro Abs 1.7 - 7.7 K/uL 2.4  2.6  4.1  2.1  1.6Low   3.4 R 2.3 R  Lymphocytes Relative % 12  26  10  21  30  9  14    Lymphs Abs 0.7 - 4.0 K/uL 0.4Low   1.1  0.6Low   0.7  0.8  0.4Low  R 0.4Low  R  Monocytes Relative % 17  7  11  10  7  13  6    Monocytes Absolute 0.1 - 1.0 K/uL 0.6  0.3  0.6  0.3  0.2  0.6 R 0.2 R  Eosinophils Relative % 0  1  0  1  1  0  0   Eosinophils Absolute 0.0 - 0.5 K/uL 0.0  0.0  0.0  0.0  0.0  0.0 R 0.0 R  Basophils Relative % 1  1  2  1  1  1  1    Basophils Absolute 0.0 - 0.1 K/uL 0.0  0.0  0.1  0.0  0.0  0.0 R, CM 0.0 R, CM  Immature Granulocytes % 1  1  0  0  0     Abs Immature Granulocytes 0.00 - 0.07 K/uL 0.02  0.03 CM 0.02 CM 0.01 CM 0.01 CM    Comment: Performed at [facility], [address] [place],  [place] [address]  Resulting Agency  [facility] CLIN LAB [place] CLIN LAB [place] CLIN LAB [place] CLIN LAB [place] CLIN LAB [place] CLIN LAB [facility] CLIN LAB      Specimen Collected: 02/23/18 13:59  Last Resulted: 02/23/18 14:26     Lab Flowsheet    Order Details    View Encounter    Lab  and Collection Details    Routing    Result History      CM=Additional commentsR=Reference range differs from displayed range      Other Results from 02/23/2018   Contains abnormal data Comprehensive metabolic panel  Order: [id number]   Status:  Final result  Visible to patient:  No (Not Released)  Next appt:  05/15/2018 at 10:00 AM in Oncology ([facility])  Dx:  Malignant neoplasm of left breast in ...   Ref Range & Units 47mo ago (02/23/18) 54mo ago (01/16/18) 19mo ago (09/21/17) 3mo ago (09/07/17) 41mo ago (08/21/17) 40mo ago (07/28/17) 12yr ago (08/03/16)  Sodium 135 - 145 mmol/L 137  141  134Low   137  138  137  133Low  R  Potassium 3.5 - 5.1 mmol/L 3.6  3.0Low   3.5  4.1  3.5  4.1  3.8 R  Chloride 98 - 111 mmol/L 104  107  100  106  106  104 CM 101 R  CO2 22 - 32 mmol/L 23  20Low   24  23  20Low   18Low   22 R  Glucose, Bld 70 - 99 mg/dL 162High   152High   201High   175High   193High   157High  CM 215High    BUN 8 - 23 mg/dL 19  19  22   28High   23  22 CM 24High  R  Creatinine, Ser 0.44 - 1.00 mg/dL 0.89  0.88  0.82  0.95  0.83  0.95  0.83 R  Calcium 8.9 - 10.3 mg/dL 9.0  9.0  9.2  9.3  9.0  9.6  9.8 R  Total Protein 6.5 - 8.1 g/dL 7.4  7.1  7.4  7.3  7.4  8.4High   7.7 R  Albumin 3.5 - 5.0 g/dL 3.9  3.9  3.8  4.2  4.2  4.6  4.5 R  AST 15 - 41 U/L 43High   38  43High   33  42High   47High   33 R  ALT 0 - 44 U/L 33  30  34  28  34  42 CM 37High  R  Alkaline Phosphatase 38 - 126 U/L 72  85  86  94  95  131High   108 R  Total Bilirubin 0.3 - 1.2 mg/dL 1.0  0.7  1.1  1.0  0.9  1.1  0.9 R  GFR calc non Af Amer >60 mL/min 60Low   >60  >60  54Low   >60  54Low     GFR calc Af Amer >60 mL/min >60  >60  >60 CM >60 CM >60 CM >60 CM    Anion gap 5 - 15 10  14  CM 10 CM 8 CM 12 CM 15 CM   Comment: Performed at [facility], [address] [place], [place] [address]  Resulting Agency  [facility] CLIN LAB [place] CLIN LAB [place] CLIN LAB [place] CLIN LAB [place] CLIN LAB [place] CLIN LAB [place] [facility]      Specimen Collected: 02/23/18 13:59  Last Resulted: 02/23/18 14:39

## 2018-05-10 MED FILL — IBRANCE 75 MG CAPSULE: 75 | 28 days supply | Qty: 21 | Fill #0

## 2018-05-11 ENCOUNTER — Ambulatory Visit: Payer: Self-pay | Admitting: Family

## 2018-05-15 ENCOUNTER — Inpatient Hospital Stay: Payer: Medicare Other

## 2018-05-15 ENCOUNTER — Inpatient Hospital Stay: Payer: Medicare Other | Admitting: Oncology

## 2018-06-06 MED FILL — IBRANCE 75 MG CAPSULE: 75 | 28 days supply | Qty: 21 | Fill #1

## 2018-07-06 ENCOUNTER — Other Ambulatory Visit: Payer: Self-pay | Admitting: Oncology

## 2018-07-09 ENCOUNTER — Other Ambulatory Visit: Payer: Self-pay | Admitting: Family

## 2018-07-09 ENCOUNTER — Other Ambulatory Visit: Payer: Self-pay | Admitting: Oncology

## 2018-07-09 ENCOUNTER — Telehealth: Payer: Self-pay | Admitting: Pharmacist

## 2018-07-09 DIAGNOSIS — C50912 Malignant neoplasm of unspecified site of left female breast: Secondary | ICD-10-CM

## 2018-07-09 MED ORDER — PALBOCICLIB 75 MG PO TABS: 75.0000 mg | ORAL_TABLET | Freq: Every day | ORAL | 1 refills | Status: DC

## 2018-07-09 MED FILL — IBRANCE 75 MG TABS: 75 | 28 days supply | Qty: 21 | Fill #0

## 2018-07-09 NOTE — Telephone Encounter (Signed)
Prescription for Ibrance TABLETS sent in, capsule rx suspended.

## 2018-07-16 ENCOUNTER — Telehealth: Payer: Self-pay | Admitting: Family

## 2018-07-16 NOTE — Telephone Encounter (Signed)
Call patient /caregiver I received a fax in regards to supplies developed excoriation and skin breakdown.  I am not really sure what this is regarding.  Is Patient having pressure ulcers, urinary incontinence? Does she use barrier cream such as Desitin?

## 2018-07-18 NOTE — Telephone Encounter (Signed)
Please give whatever is needed for [facility] request for supplies as needed for Kathy Mckay Thank you

## 2018-07-18 NOTE — Telephone Encounter (Signed)
I have sent OV notes to [facility].

## 2018-07-23 ENCOUNTER — Other Ambulatory Visit: Payer: Self-pay | Admitting: Family

## 2018-08-06 MED FILL — IBRANCE 75 MG TABS: 75 | 28 days supply | Qty: 21 | Fill #1

## 2018-08-31 ENCOUNTER — Other Ambulatory Visit: Payer: Self-pay | Admitting: Oncology

## 2018-08-31 ENCOUNTER — Other Ambulatory Visit: Payer: Self-pay

## 2018-08-31 ENCOUNTER — Telehealth: Payer: Self-pay | Admitting: *Deleted

## 2018-08-31 DIAGNOSIS — C50912 Malignant neoplasm of unspecified site of left female breast: Secondary | ICD-10-CM

## 2018-08-31 NOTE — Telephone Encounter (Signed)
I called [facility] and spoke to [facility] and I told her that pt needs refill of Ibrance but md really wants pt to come and get blood drawn. They were already contacted and will be here 8/24 9:45. They would like to go back home and have video chat later and I changed her appt to see md to video and it is 11:30.

## 2018-08-31 NOTE — Telephone Encounter (Signed)
Patient has appointment Monday 8/24 CBC with Differential/Platelet Order: [id number] Status:  Final result Visible to patient:  No (not released) Next appt:  09/03/2018 at 09:45 AM in Oncology ([facility]) Dx:  Malignant neoplasm of left breast in ...  Ref Range & Units 76mo ago 37mo ago 47mo ago  WBC 4.0 - 10.5 K/uL 3.4Low   4.0  5.3   RBC 3.87 - 5.11 MIL/uL 3.97  3.97  4.05   Hemoglobin 12.0 - 15.0 g/dL 14.5  14.6  14.3   HCT 36.0 - 46.0 % 42.4  42.8  43.2   MCV 80.0 - 100.0 fL 106.8High   107.8High   106.7High    MCH 26.0 - 34.0 pg 36.5High   36.8High   35.3High    MCHC 30.0 - 36.0 g/dL 34.2  34.1  33.1   RDW 11.5 - 15.5 % 13.1  13.4  15.9High    Platelets 150 - 400 K/uL 123Low   142Low   179   nRBC 0.0 - 0.2 % 0.0  0.0  0.0   Neutrophils Relative % % 69  64  77   Neutro Abs 1.7 - 7.7 K/uL 2.4  2.6  4.1   Lymphocytes Relative % 12  26  10    Lymphs Abs 0.7 - 4.0 K/uL 0.4Low   1.1  0.6Low    Monocytes Relative % 17  7  11    Monocytes Absolute 0.1 - 1.0 K/uL 0.6  0.3  0.6   Eosinophils Relative % 0  1  0   Eosinophils Absolute 0.0 - 0.5 K/uL 0.0  0.0  0.0   Basophils Relative % 1  1  2    Basophils Absolute 0.0 - 0.1 K/uL 0.0  0.0  0.1   Immature Granulocytes % 1  1  0   Abs Immature Granulocytes 0.00 - 0.07 K/uL 0.02  0.03 CM  0.02 CM   Comment: Performed at [facility], [address] [place], [place] [address]  Resulting Agency  [facility] CLIN LAB [facility] CLIN LAB [facility] CLIN LAB      Specimen Collected: 02/23/18 13:59 Last Resulted: 02/23/18 14:26     Lab Flowsheet   Order Details   View Encounter   Lab and Collection Details   Routing   Result History     CM=Additional comments      Other Results from 02/23/2018  Contains abnormal data Comprehensive metabolic panel Order: [id number]  Status:  Final result Visible to patient:  No (not released) Next appt:  09/03/2018 at 09:45 AM in Oncology ([facility]) Dx:  Malignant neoplasm of left breast in ...  Ref Range  & Units 15mo ago 41mo ago 62mo ago  Sodium 135 - 145 mmol/L 137  141  134Low    Potassium 3.5 - 5.1 mmol/L 3.6  3.0Low   3.5   Chloride 98 - 111 mmol/L 104  107  100   CO2 22 - 32 mmol/L 23  20Low   24   Glucose, Bld 70 - 99 mg/dL 162High   152High   201High    BUN 8 - 23 mg/dL 19  19  22    Creatinine, Ser 0.44 - 1.00 mg/dL 0.89  0.88  0.82   Calcium 8.9 - 10.3 mg/dL 9.0  9.0  9.2   Total Protein 6.5 - 8.1 g/dL 7.4  7.1  7.4   Albumin 3.5 - 5.0 g/dL 3.9  3.9  3.8   AST 15 - 41 U/L 43High   38  43High    ALT 0 - 44 U/L 33  30  34   Alkaline Phosphatase 38 - 126 U/L 72  85  86   Total Bilirubin 0.3 - 1.2 mg/dL 1.0  0.7  1.1   GFR calc non Af Amer >60 mL/min 60Low   >60  >60   GFR calc Af Amer >60 mL/min >60  >60  >60 CM   Anion gap 5 - 15 10  14  CM  10 CM   Comment: Performed at [facility], [address] [place], [place] [address]  Resulting Agency  [facility] CLIN LAB [facility] CLIN LAB [facility] CLIN LAB      Specimen Collected: 02/23/18 13:59 Last Resulted: 02/23/18 14:39

## 2018-09-03 ENCOUNTER — Encounter: Payer: Self-pay | Admitting: Oncology

## 2018-09-03 ENCOUNTER — Other Ambulatory Visit: Payer: Self-pay | Admitting: *Deleted

## 2018-09-03 ENCOUNTER — Other Ambulatory Visit: Payer: Self-pay

## 2018-09-03 ENCOUNTER — Inpatient Hospital Stay (HOSPITAL_BASED_OUTPATIENT_CLINIC_OR_DEPARTMENT_OTHER): Payer: Medicare Other | Admitting: Oncology

## 2018-09-03 ENCOUNTER — Inpatient Hospital Stay: Payer: Medicare Other | Admitting: Oncology

## 2018-09-03 ENCOUNTER — Inpatient Hospital Stay: Payer: Medicare Other | Attending: Oncology

## 2018-09-03 DIAGNOSIS — Z170 Estrogen receptor positive status [ER+]: Secondary | ICD-10-CM | POA: Insufficient documentation

## 2018-09-03 DIAGNOSIS — Z79899 Other long term (current) drug therapy: Secondary | ICD-10-CM | POA: Diagnosis not present

## 2018-09-03 DIAGNOSIS — C50912 Malignant neoplasm of unspecified site of left female breast: Secondary | ICD-10-CM

## 2018-09-03 DIAGNOSIS — R590 Localized enlarged lymph nodes: Secondary | ICD-10-CM | POA: Insufficient documentation

## 2018-09-03 DIAGNOSIS — D702 Other drug-induced agranulocytosis: Secondary | ICD-10-CM | POA: Diagnosis not present

## 2018-09-03 DIAGNOSIS — Z809 Family history of malignant neoplasm, unspecified: Secondary | ICD-10-CM | POA: Diagnosis not present

## 2018-09-03 DIAGNOSIS — E785 Hyperlipidemia, unspecified: Secondary | ICD-10-CM | POA: Insufficient documentation

## 2018-09-03 DIAGNOSIS — C7800 Secondary malignant neoplasm of unspecified lung: Secondary | ICD-10-CM | POA: Diagnosis not present

## 2018-09-03 DIAGNOSIS — Z79810 Long term (current) use of selective estrogen receptor modulators (SERMs): Secondary | ICD-10-CM | POA: Insufficient documentation

## 2018-09-03 DIAGNOSIS — M810 Age-related osteoporosis without current pathological fracture: Secondary | ICD-10-CM | POA: Insufficient documentation

## 2018-09-03 DIAGNOSIS — I10 Essential (primary) hypertension: Secondary | ICD-10-CM | POA: Diagnosis not present

## 2018-09-03 DIAGNOSIS — Z833 Family history of diabetes mellitus: Secondary | ICD-10-CM | POA: Insufficient documentation

## 2018-09-03 DIAGNOSIS — N6310 Unspecified lump in the right breast, unspecified quadrant: Secondary | ICD-10-CM | POA: Diagnosis not present

## 2018-09-03 DIAGNOSIS — R110 Nausea: Secondary | ICD-10-CM | POA: Diagnosis not present

## 2018-09-03 DIAGNOSIS — D696 Thrombocytopenia, unspecified: Secondary | ICD-10-CM | POA: Insufficient documentation

## 2018-09-03 DIAGNOSIS — R234 Changes in skin texture: Secondary | ICD-10-CM | POA: Diagnosis not present

## 2018-09-03 LAB — CBC WITH DIFFERENTIAL/PLATELET
Abs Immature Granulocytes: 0.01 10*3/uL (ref 0.00–0.07)
Basophils Absolute: 0 10*3/uL (ref 0.0–0.1)
Basophils Relative: 1 %
Eosinophils Absolute: 0 10*3/uL (ref 0.0–0.5)
Eosinophils Relative: 1 %
HCT: 42.2 % (ref 36.0–46.0)
Hemoglobin: 14.5 g/dL (ref 12.0–15.0)
Immature Granulocytes: 0 %
Lymphocytes Relative: 26 %
Lymphs Abs: 0.7 10*3/uL (ref 0.7–4.0)
MCH: 38 pg — ABNORMAL HIGH (ref 26.0–34.0)
MCHC: 34.4 g/dL (ref 30.0–36.0)
MCV: 110.5 fL — ABNORMAL HIGH (ref 80.0–100.0)
Monocytes Absolute: 0.5 10*3/uL (ref 0.1–1.0)
Monocytes Relative: 17 %
Neutro Abs: 1.5 10*3/uL — ABNORMAL LOW (ref 1.7–7.7)
Neutrophils Relative %: 55 %
Platelets: 92 10*3/uL — ABNORMAL LOW (ref 150–400)
RBC: 3.82 MIL/uL — ABNORMAL LOW (ref 3.87–5.11)
RDW: 13.9 % (ref 11.5–15.5)
WBC: 2.8 10*3/uL — ABNORMAL LOW (ref 4.0–10.5)
nRBC: 0 % (ref 0.0–0.2)

## 2018-09-03 LAB — COMPREHENSIVE METABOLIC PANEL
ALT: 27 U/L (ref 0–44)
AST: 36 U/L (ref 15–41)
Albumin: 4.3 g/dL (ref 3.5–5.0)
Alkaline Phosphatase: 80 U/L (ref 38–126)
Anion gap: 11 (ref 5–15)
BUN: 19 mg/dL (ref 8–23)
CO2: 23 mmol/L (ref 22–32)
Calcium: 9.1 mg/dL (ref 8.9–10.3)
Chloride: 104 mmol/L (ref 98–111)
Creatinine, Ser: 0.76 mg/dL (ref 0.44–1.00)
GFR calc Af Amer: >60 mL/min (ref >60)
GFR calc non Af Amer: >60 mL/min (ref >60)
Glucose, Bld: 165 mg/dL — ABNORMAL HIGH (ref 70–99)
Potassium: 3.8 mmol/L (ref 3.5–5.1)
Sodium: 138 mmol/L (ref 135–145)
Total Bilirubin: 0.8 mg/dL (ref 0.3–1.2)
Total Protein: 7.6 g/dL (ref 6.5–8.1)

## 2018-09-03 MED ORDER — PALBOCICLIB 75 MG PO TABS: 75.0000 mg | ORAL_TABLET | Freq: Every day | ORAL | 1 refills | Status: DC

## 2018-09-03 MED FILL — IBRANCE 75 MG TABS: 75 | 28 days supply | Qty: 21 | Fill #0

## 2018-09-03 NOTE — Progress Notes (Signed)
I spoke to [facility] the person in charge at the [facility]. She states that they have not had any issues with meds for the pt. She is doing the same. No concerns.

## 2018-09-03 NOTE — Progress Notes (Deleted)
Hematology/Oncology Consult note [facility]  [phone] [phone]  Patient Care Team: Burnard Hawthorne, FNP as PCP - General (Family Medicine)   Name of the patient: Kathy Mckay  [id number]  12/07/1932   Date of visit: 09/03/18   ECOG PS- *** Pain scale- *** Opioid associated constipation- ***  Review of systems- ROS   Current treatment- ***  No Known Allergies   Past Medical History:  Diagnosis Date  . Breast cancer ([place])   . Cancer ([place])   . Developmental delay   . GERD (gastroesophageal reflux disease)   . Hyperlipidemia   . Hypertension   . Osteoporosis      Past Surgical History:  Procedure Laterality Date  . ABDOMINAL HYSTERECTOMY    . BREAST BIOPSY Left 07/19/2017   invasive mammary carcinoma  . BREAST BIOPSY Left 07/19/2017   invasive mammary carcinoma  . SHOULDER SURGERY      Social History   Socioeconomic History  . Marital status: Single    Spouse name: Not on file  . Number of children: Not on file  . Years of education: Not on file  . Highest education level: Not on file  Occupational History  . Not on file  Social Needs  . Financial resource strain: Not on file  . Food insecurity    Worry: Not on file    Inability: Not on file  . Transportation needs    Medical: Not on file    Non-medical: Not on file  Tobacco Use  . Smoking status: Never Smoker  . Smokeless tobacco: Never Used  Substance and Sexual Activity  . Alcohol use: No  . Drug use: No  . Sexual activity: Never  Lifestyle  . Physical activity    Days per week: Not on file    Minutes per session: Not on file  . Stress: Not on file  Relationships  . Social Herbalist on phone: Not on file    Gets together: Not on file    Attends religious service: Not on file    Active member of club or organization: Not on file    Attends meetings of clubs or organizations: Not on file    Relationship status: Not on file   . Intimate partner violence    Fear of current or ex partner: Not on file    Emotionally abused: Not on file    Physically abused: Not on file    Forced sexual activity: Not on file  Other Topics Concern  . Not on file  Social History Narrative   Lives at a family care home. No living relatives. Guardian of the state.      Lives [place] [facility] for past 6 years.     Family History  Problem Relation Age of Onset  . Diabetes Mother   . Diabetes Father   . Cancer Sister      Current Outpatient Medications:  .  amLODipine (NORVASC) 10 MG tablet, TAKE 1 TABLET BY MOUTH DAILY, Disp: 90 tablet, Rfl: 1 .  atorvastatin (LIPITOR) 40 MG tablet, TAKE 1 TABLET BY MOUTH AT BEDTIME FOR CHOLESTEROL, Disp: 90 tablet, Rfl: 1 .  esomeprazole (NEXIUM) 40 MG capsule, TAKE 1 CAPSULE BY MOUTH EVERY DAY AT 8:00 AM, Disp: 90 capsule, Rfl: 0 .  Menthol, Topical Analgesic, (BIOFREEZE) 4 % GEL, 1 application to painful area every 4 hours as needed, Disp: 89 mL, Rfl: 2 .  mirtazapine (REMERON) 45  MG tablet, TAKE 1 TABLET BY MOUTH AT BEDTIME, Disp: 90 tablet, Rfl: 3 .  prochlorperazine (COMPAZINE) 10 MG tablet, TAKE 1 TABLET BY MOUTH EVERY 8 HOURS AS NEEDED FOR NAUSEA OR VOMITING., Disp: 30 tablet, Rfl: 0 .  tamoxifen (NOLVADEX) 20 MG tablet, TAKE 1 TABLET BY MOUTH ONCE DAILY, Disp: 30 tablet, Rfl: 3 .  palbociclib (IBRANCE) 75 MG tablet, Take 1 tablet (75 mg total) by mouth daily. Take for 21 days on, 7 days off, repeat every 28 days., Disp: 21 tablet, Rfl: 1  Physical exam: There were no vitals filed for this visit. Physical Exam   CMP Latest Ref Rng & Units 09/03/2018  Glucose 70 - 99 mg/dL 165(H)  BUN 8 - 23 mg/dL 19  Creatinine 0.44 - 1.00 mg/dL 0.76  Sodium 135 - 145 mmol/L 138  Potassium 3.5 - 5.1 mmol/L 3.8  Chloride 98 - 111 mmol/L 104  CO2 22 - 32 mmol/L 23  Calcium 8.9 - 10.3 mg/dL 9.1  Total Protein 6.5 - 8.1 g/dL 7.6  Total Bilirubin 0.3 - 1.2 mg/dL 0.8  Alkaline Phos 38 - 126 U/L 80   AST 15 - 41 U/L 36  ALT 0 - 44 U/L 27   CBC Latest Ref Rng & Units 09/03/2018  WBC 4.0 - 10.5 K/uL 2.8(L)  Hemoglobin 12.0 - 15.0 g/dL 14.5  Hematocrit 36.0 - 46.0 % 42.2  Platelets 150 - 400 K/uL 92(L)    No images are attached to the encounter.  No results found.   Assessment and plan- Patient is a 85 y.o. female ***   Visit Diagnosis 1. Malignant neoplasm of left breast in female, estrogen receptor positive, unspecified site of breast ([place])      Dr. Randa Evens, MD, MPH [facility] [id number] 09/03/2018 12:06 PM

## 2018-09-03 NOTE — Progress Notes (Signed)
I connected with Joeseph Amor on 09/03/18 at 11:30 AM EDT by video enabled telemedicine visit and verified that I am speaking with the correct person using two identifiers.   I discussed the limitations, risks, security and privacy concerns of performing an evaluation and management service by telemedicine and the availability of in-person appointments. I also discussed with the patient that there may be a patient responsible charge related to this service. The patient expressed understanding and agreed to proceed.  Other persons participating in the visit and their role in the encounter:  Patients caregiver [name]  Patient's location:  Group home Provider's location:  [facility]  Diagnosis- stage IV invasive mammary carcinoma of the left breast cT4b cN1 M1 with lung metastases ER PR positive HER-2/neu negative  Chief complaint/ Reason for visit- routine f/u of breast cancer on tamoxifen and ibrance  Heme/Onc history: patient is a 85 year old female with developmental disorder. She is a ward of the state and lives in a group home. She is here with her caregiver Ms. Tyree. She has been at the group home for the last 4 years. She does not have any living family other than a distant cousin. Her assigned social worker Ms. Glennon Mac is her healthcare proxy. Her past medical history is significant for hypertension hyperlipidemia and osteoporosis among other medical problems. Her caregiver states that overall this patient is a happy-go-lucky person and has been doing well at her nursing home. She has not had any recent falls or hospitalizations. She recently underwent a screening mammogram which picked up a mass in her left breast. Patient has not had a prior mammogram before this for years.  Mammogram and ultrasound showed 2.9 x 2.8 x 3 cm mass in the left retroareolar region. There is extension into the skin and nipple areole or complex. Associated skin thickening. At least 2 abnormal appearing left axillary lymph  nodes with cortical thickening up to 1 cm.  Core biopsy of the breast mass and the axillary lymph node revealed invasive mammary carcinoma, grade 2 ER Greater than 90% and PR 11-50% positive and HER-2/neu negative. Extracapsular extension was noted in the lymph node biopsy.   CT chest abdomen and pelvis showed left breast mass, suspicious left axillary and internal mammary adenopathy along with bilateral pulmonary nodules measuring up to 16 mm in diameter consistent with metastatic disease. No evidence of bone metastases on bone scan  Patient was started on ibrance 100 mg. She could not tolerate the dose due to refractory nausea and dose was reduced to 75 mg.Baseline tumor marker is not elevated.  Interval history- Patient is doing well at her group home. She is tolerating tamoxifen and ibrance well per caregiver mary. No nausea/ vomiting. Appetite is normal   ROS limited as patient has developmental disorder and video  visit Review of Systems  Constitutional: Negative for chills, fever, malaise/fatigue and weight loss.  HENT: Negative for congestion, ear discharge and nosebleeds.   Eyes: Negative for blurred vision.  Respiratory: Negative for cough, hemoptysis, sputum production, shortness of breath and wheezing.   Cardiovascular: Negative for chest pain, palpitations, orthopnea and claudication.  Gastrointestinal: Negative for abdominal pain, blood in stool, constipation, diarrhea, heartburn, melena, nausea and vomiting.  Genitourinary: Negative for dysuria, flank pain, frequency, hematuria and urgency.  Musculoskeletal: Negative for back pain, joint pain and myalgias.  Skin: Negative for rash.  Neurological: Negative for dizziness, tingling, focal weakness, seizures, weakness and headaches.  Endo/Heme/Allergies: Does not bruise/bleed easily.  Psychiatric/Behavioral: Negative for depression and suicidal  ideas. The patient does not have insomnia.     No Known Allergies  Past Medical  History:  Diagnosis Date  . Breast cancer ([place])   . Cancer ([place])   . Developmental delay   . GERD (gastroesophageal reflux disease)   . Hyperlipidemia   . Hypertension   . Osteoporosis     Past Surgical History:  Procedure Laterality Date  . ABDOMINAL HYSTERECTOMY    . BREAST BIOPSY Left 07/19/2017   invasive mammary carcinoma  . BREAST BIOPSY Left 07/19/2017   invasive mammary carcinoma  . SHOULDER SURGERY      Social History   Socioeconomic History  . Marital status: Single    Spouse name: Not on file  . Number of children: Not on file  . Years of education: Not on file  . Highest education level: Not on file  Occupational History  . Not on file  Social Needs  . Financial resource strain: Not on file  . Food insecurity    Worry: Not on file    Inability: Not on file  . Transportation needs    Medical: Not on file    Non-medical: Not on file  Tobacco Use  . Smoking status: Never Smoker  . Smokeless tobacco: Never Used  Substance and Sexual Activity  . Alcohol use: No  . Drug use: No  . Sexual activity: Never  Lifestyle  . Physical activity    Days per week: Not on file    Minutes per session: Not on file  . Stress: Not on file  Relationships  . Social Herbalist on phone: Not on file    Gets together: Not on file    Attends religious service: Not on file    Active member of club or organization: Not on file    Attends meetings of clubs or organizations: Not on file    Relationship status: Not on file  . Intimate partner violence    Fear of current or ex partner: Not on file    Emotionally abused: Not on file    Physically abused: Not on file    Forced sexual activity: Not on file  Other Topics Concern  . Not on file  Social History Narrative   Lives at a family care home. No living relatives. Guardian of the state.      Lives [place] [facility] for past 6 years.     Family History  Problem Relation Age of Onset  . Diabetes Mother   .  Diabetes Father   . Cancer Sister      Current Outpatient Medications:  .  amLODipine (NORVASC) 10 MG tablet, TAKE 1 TABLET BY MOUTH DAILY, Disp: 90 tablet, Rfl: 1 .  atorvastatin (LIPITOR) 40 MG tablet, TAKE 1 TABLET BY MOUTH AT BEDTIME FOR CHOLESTEROL, Disp: 90 tablet, Rfl: 1 .  esomeprazole (NEXIUM) 40 MG capsule, TAKE 1 CAPSULE BY MOUTH EVERY DAY AT 8:00 AM, Disp: 90 capsule, Rfl: 0 .  Menthol, Topical Analgesic, (BIOFREEZE) 4 % GEL, 1 application to painful area every 4 hours as needed, Disp: 89 mL, Rfl: 2 .  mirtazapine (REMERON) 45 MG tablet, TAKE 1 TABLET BY MOUTH AT BEDTIME, Disp: 90 tablet, Rfl: 3 .  prochlorperazine (COMPAZINE) 10 MG tablet, TAKE 1 TABLET BY MOUTH EVERY 8 HOURS AS NEEDED FOR NAUSEA OR VOMITING., Disp: 30 tablet, Rfl: 0 .  tamoxifen (NOLVADEX) 20 MG tablet, TAKE 1 TABLET BY MOUTH ONCE DAILY, Disp: 30 tablet, Rfl: 3 .  palbociclib (IBRANCE) 75 MG tablet, Take 1 tablet (75 mg total) by mouth daily. Take for 21 days on, 7 days off, repeat every 28 days., Disp: 21 tablet, Rfl: 1   CMP Latest Ref Rng & Units 09/03/2018  Glucose 70 - 99 mg/dL 165(H)  BUN 8 - 23 mg/dL 19  Creatinine 0.44 - 1.00 mg/dL 0.76  Sodium 135 - 145 mmol/L 138  Potassium 3.5 - 5.1 mmol/L 3.8  Chloride 98 - 111 mmol/L 104  CO2 22 - 32 mmol/L 23  Calcium 8.9 - 10.3 mg/dL 9.1  Total Protein 6.5 - 8.1 g/dL 7.6  Total Bilirubin 0.3 - 1.2 mg/dL 0.8  Alkaline Phos 38 - 126 U/L 80  AST 15 - 41 U/L 36  ALT 0 - 44 U/L 27   CBC Latest Ref Rng & Units 09/03/2018  WBC 4.0 - 10.5 K/uL 2.8(L)  Hemoglobin 12.0 - 15.0 g/dL 14.5  Hematocrit 36.0 - 46.0 % 42.2  Platelets 150 - 400 K/uL 92(L)     Observation/objective: appears in no acute distress over video visit today. Breathing is non labored  Assessment and plan:withinvasive mammary carcinoma of the left breast stage IV cT4b cN1 M1 ER PR positive HER-2/neu negative with lung metastases. This is a routine f/u of breast cancer on ibrance and  tamoxifen  I have reviewed CT chest abdomen pelvis with contrast images that was done back in march 2020. I discussed findings with her caregiver. Left breast mass and adenopathy had improved and lung nodules had almost resolved. She will continue ibrance and tamoxifen at this time. She has leukopenia / neutropenia but ANC >1. Mild thrombocytopenia. Ok to continue ibrance at current dose  Discussed the need to monitor cbc monthly as she is on ibrance. Caregiver hesitant to come for in person visits due to covid  Follow-up instructions: repeat cbc with diff , cmp in 1 month and 2 months. I will see her in my office in 2 months  I discussed the assessment and treatment plan with the patient. The patient was provided an opportunity to ask questions and all were answered. The patient agreed with the plan and demonstrated an understanding of the instructions.   The patient was advised to call back or seek an in-person evaluation if the symptoms worsen or if the condition fails to improve as anticipated.    Visit Diagnosis: 1. High risk medication use   2. Malignant neoplasm of left breast in female, estrogen receptor positive, unspecified site of breast ([place])   3. Drug-induced neutropenia ([place])     Dr. Randa Evens, MD, MPH [facility] [phone] 09/03/2018 12:09 PM

## 2018-09-04 LAB — CANCER ANTIGEN 15-3: CA 15-3: 29.4 U/mL — ABNORMAL HIGH (ref 0.0–25.0)

## 2018-09-04 LAB — CANCER ANTIGEN 27.29: CA 27.29: 37.4 U/mL (ref 0.0–38.6)

## 2018-09-07 ENCOUNTER — Other Ambulatory Visit: Payer: Self-pay | Admitting: Family

## 2018-10-01 MED FILL — IBRANCE 75 MG TABS: 75 | 28 days supply | Qty: 21 | Fill #1

## 2018-10-04 ENCOUNTER — Inpatient Hospital Stay: Payer: Medicare Other | Attending: Oncology

## 2018-10-08 ENCOUNTER — Telehealth: Payer: Self-pay | Admitting: Family

## 2018-10-08 NOTE — Telephone Encounter (Signed)
Marcello Moores w/[facility] [phone] is requesting a Miralax prescription for the patient.

## 2018-10-08 NOTE — Telephone Encounter (Signed)
Please find out what issue she is having with constipation. Is she having any other symptoms?

## 2018-10-08 NOTE — Telephone Encounter (Signed)
Kathy Mckay w/[facility] [phone] is requesting a Miralax prescription for the patient

## 2018-10-09 NOTE — Telephone Encounter (Signed)
[name] called back after hours , stated that pt is having minor constipation for about a week and 1/2 but no other symptoms  Best number for mary was [place]

## 2018-10-09 NOTE — Telephone Encounter (Signed)
Kathy Mckay's number [phone]

## 2018-10-09 NOTE — Telephone Encounter (Signed)
I called number provided for patient & was directed to [name] who usually handles patient care. I left message for her to call back.

## 2018-10-10 NOTE — Telephone Encounter (Signed)
Left message to return call to patient.

## 2018-10-10 NOTE — Telephone Encounter (Signed)
Kathy Mckay returning call to [place].

## 2018-10-15 MED ORDER — POLYETHYLENE GLYCOL 3350 17 GM/SCOOP PO POWD: 17.0000 g | Freq: Every day | ORAL | 0 refills | Status: DC | PRN

## 2018-10-15 NOTE — Telephone Encounter (Signed)
MiraLAX sent to pharmacy.  Patient should follow-up if this is not beneficial.

## 2018-10-15 NOTE — Telephone Encounter (Signed)
Caregiver stated patient is having hard stools is why asking for Miralax Script no other symptoms please advise can fill Miralax.

## 2018-10-25 ENCOUNTER — Other Ambulatory Visit: Payer: Self-pay | Admitting: Oncology

## 2018-10-25 DIAGNOSIS — Z170 Estrogen receptor positive status [ER+]: Secondary | ICD-10-CM

## 2018-10-25 DIAGNOSIS — C50912 Malignant neoplasm of unspecified site of left female breast: Secondary | ICD-10-CM

## 2018-10-26 MED FILL — IBRANCE 75 MG TABS: 75 | 28 days supply | Qty: 21 | Fill #0

## 2018-11-02 ENCOUNTER — Telehealth: Payer: Self-pay | Admitting: Oncology

## 2018-11-02 ENCOUNTER — Inpatient Hospital Stay: Payer: Medicare Other | Admitting: Oncology

## 2018-11-02 ENCOUNTER — Inpatient Hospital Stay: Payer: Medicare Other | Attending: Oncology

## 2018-11-02 NOTE — Telephone Encounter (Signed)
LM to try and R/S NS appt-ltg °

## 2018-11-07 ENCOUNTER — Other Ambulatory Visit: Payer: Self-pay | Admitting: Family

## 2018-11-07 ENCOUNTER — Other Ambulatory Visit: Payer: Self-pay | Admitting: Oncology

## 2018-11-14 ENCOUNTER — Telehealth: Payer: Self-pay | Admitting: *Deleted

## 2018-11-14 ENCOUNTER — Other Ambulatory Visit: Payer: Self-pay | Admitting: *Deleted

## 2018-11-14 NOTE — Telephone Encounter (Signed)
I called and spoke to [facility] and made her aware that pt needs to be monitored with her CBC/d labs in order to continue the ibrance. The doctor is worried about if her counts are too low then she should be holding the med. Or reducing the med. It is too dangerous to cont. Giving ibrance and have no labs to check. She is happy to bring her over next week and chose 11/11 at 1 pm. After the appt once we get the labs then we can arrange video visit to discuss ibrance and how pt. Is doing. [name] will have her here next week.

## 2018-11-21 ENCOUNTER — Inpatient Hospital Stay: Payer: Medicare Other

## 2018-11-27 ENCOUNTER — Other Ambulatory Visit: Payer: Self-pay

## 2018-11-27 MED FILL — IBRANCE 75 MG TABS: 75 | 28 days supply | Qty: 21 | Fill #1

## 2018-11-28 ENCOUNTER — Inpatient Hospital Stay: Payer: Medicare Other | Attending: Oncology

## 2018-12-04 ENCOUNTER — Other Ambulatory Visit: Payer: Self-pay | Admitting: Family

## 2018-12-20 ENCOUNTER — Other Ambulatory Visit: Payer: Self-pay | Admitting: Oncology

## 2018-12-20 DIAGNOSIS — Z170 Estrogen receptor positive status [ER+]: Secondary | ICD-10-CM

## 2018-12-20 DIAGNOSIS — C50912 Malignant neoplasm of unspecified site of left female breast: Secondary | ICD-10-CM

## 2018-12-20 NOTE — Telephone Encounter (Signed)
Patient has no showed for several appointments. Has lab only appointment 12/15, but no follow up appts sceheduled  CBC with Differential/Platelet Order: [id number]  Status: Final result Visible to patient: No (not released) Next appt: 12/25/2018 at 11:30 AM in Oncology ([facility]) Dx: Malignant neoplasm of left breast in ...    Ref Range & Units 3 mo ago 10 mo ago 11 mo ago  WBC 4.0 - 10.5 K/uL 2.8 3.4 4.0   RBC 3.87 - 5.11 MIL/uL 3.82 3.97  3.97   Hemoglobin 12.0 - 15.0 g/dL 14.5  14.5  14.6   HCT 36.0 - 46.0 % 42.2  42.4  42.8   MCV 80.0 - 100.0 fL 110.5 106.8 107.8  MCH 26.0 - 34.0 pg 38.0 36.5 36.8  MCHC 30.0 - 36.0 g/dL 34.4  34.2  34.1   RDW 11.5 - 15.5 % 13.9  13.1  13.4   Platelets 150 - 400 K/uL 92 123 142  nRBC 0.0 - 0.2 % 0.0  0.0  0.0   Neutrophils Relative % % 55  69  64   Neutro Abs 1.7 - 7.7 K/uL 1.5 2.4  2.6   Lymphocytes Relative % 26  12  26    Lymphs Abs 0.7 - 4.0 K/uL 0.7  0.4 1.1   Monocytes Relative % 17  17  7    Monocytes Absolute 0.1 - 1.0 K/uL 0.5  0.6  0.3   Eosinophils Relative % 1  0  1   Eosinophils Absolute 0.0 - 0.5 K/uL 0.0  0.0  0.0   Basophils Relative % 1  1  1    Basophils Absolute 0.0 - 0.1 K/uL 0.0  0.0  0.0   Immature Granulocytes % 0  1  1   Abs Immature Granulocytes 0.00 - 0.07 K/uL 0.01  0.02 CM  0.03 CM   Comment: Performed at [facility], [address] [place], [place] [address]  Resulting Agency  [facility] CLIN LAB [facility] CLIN LAB [facility] LAB     Specimen Collected: 09/03/18 10:12 Last Resulted: 09/03/18 10:23  Lab Flowsheet  Order Details  View Encounter  Lab and Collection Details  Routing  Result History   CM=Additional comments    Other Results from 09/03/2018 Cancer antigen 15-3  Status: Final result Visible to patient: No (not released) Next appt: 12/25/2018 at 11:30 AM in Oncology ([facility]) Dx: Malignant neoplasm of left breast in ...  Order: [id number]    Ref Range & Units 3 mo ago 1 yr ago  CA 15-3 0.0 - 25.0 U/mL  29.4 23.2 CM   Comment: (NOTE)  Roche Diagnostics Electrochemiluminescence Immunoassay (ECLIA)  Values obtained with different assay methods or kits cannot be  used interchangeably. Results cannot be interpreted as absolute  evidence of the presence or absence of malignant disease.  Performed At: [facility]  [address] [place], [place] [id number]  Rush Farmer MD [id number]    Resulting Agency  [facility] CLIN LAB [facility] LAB     Specimen Collected: 09/03/18 10:12 Last Resulted: 09/04/18 06:36  Lab Flowsheet  Order Details  View Encounter  Lab and Collection Details  Routing  Result History   CM=Additional comments       Cancer antigen 27.29  Status: Final result Visible to patient: No (not released) Next appt: 12/25/2018 at 11:30 AM in Oncology ([facility]) Dx: Malignant neoplasm of left breast in ...  Order: [id number]    Ref Range & Units 3 mo ago 1 yr  ago  CA 27.29 0.0 - 38.6 U/mL 37.4  29.6 CM   Comment: (NOTE)  [employer]aur Immunochemiluminometric Methodology [facility])  Values obtained with different assay methods or kits cannot be used  interchangeably. Results cannot be interpreted as absolute evidence  of the presence or absence of malignant disease.  Performed At: [facility]  [address] [place], [place] [id number]  Rush Farmer MD [id number]    Resulting Agency  [facility] CLIN LAB [facility] LAB     Specimen Collected: 09/03/18 10:12 Last Resulted: 09/04/18 09:37  Lab Flowsheet  Order Details  View Encounter  Lab and Collection Details  Routing  Result History   CM=Additional comments       Comprehensive metabolic panel  Status: Final result Visible to patient: No (not released) Next appt: 12/25/2018 at 11:30 AM in Oncology ([facility]) Dx: Malignant neoplasm of left breast in ...  Order: [id number]    Ref Range & Units 3 mo ago 10 mo ago 11 mo ago  Sodium 135 - 145 mmol/L 138  137  141   Potassium 3.5 - 5.1 mmol/L 3.8  3.6  3.0   Chloride 98 - 111 mmol/L 104  104  107   CO2 22 - 32 mmol/L 23  23  20   Glucose, Bld 70 - 99 mg/dL 165 162 152  BUN 8 - 23 mg/dL 19  19  19    Creatinine, Ser 0.44 - 1.00 mg/dL 0.76  0.89  0.88   Calcium 8.9 - 10.3 mg/dL 9.1  9.0  9.0   Total Protein 6.5 - 8.1 g/dL 7.6  7.4  7.1   Albumin 3.5 - 5.0 g/dL 4.3  3.9  3.9   AST 15 - 41 U/L 36  43 38   ALT 0 - 44 U/L 27  33  30   Alkaline Phosphatase 38 - 126 U/L 80  72  85   Total Bilirubin 0.3 - 1.2 mg/dL 0.8  1.0  0.7   GFR calc non Af Amer >60 mL/min >60  60 mL/min" class="[id number]"60 60 mL/min" class="[id number]">60   GFR calc Af Amer >60 mL/min >60  60 mL/min" class="[id number]">60  60 mL/min" class="[id number]">60   Anion gap 5 - 15 11  10  CM  14 CM   Comment: Performed at [facility], [address] [place], [place] [address]  Resulting Agency  [facility] CLIN LAB [facility] CLIN LAB [facility] CLIN LAB     Specimen Collected: 09/03/18 10:12 Last Resulted: 09/03/18 10:36

## 2018-12-24 MED FILL — IBRANCE 75 MG TABS: 75 | 28 days supply | Qty: 21 | Fill #0

## 2018-12-25 ENCOUNTER — Other Ambulatory Visit: Payer: Self-pay

## 2018-12-25 ENCOUNTER — Inpatient Hospital Stay: Payer: Medicare Other | Attending: Oncology

## 2018-12-25 DIAGNOSIS — C50912 Malignant neoplasm of unspecified site of left female breast: Secondary | ICD-10-CM | POA: Insufficient documentation

## 2018-12-25 DIAGNOSIS — Z79899 Other long term (current) drug therapy: Secondary | ICD-10-CM | POA: Insufficient documentation

## 2018-12-25 DIAGNOSIS — Z170 Estrogen receptor positive status [ER+]: Secondary | ICD-10-CM | POA: Insufficient documentation

## 2018-12-25 LAB — CBC WITH DIFFERENTIAL/PLATELET
Abs Immature Granulocytes: 0.01 10*3/uL (ref 0.00–0.07)
Basophils Absolute: 0 10*3/uL (ref 0.0–0.1)
Basophils Relative: 1 %
Eosinophils Absolute: 0 10*3/uL (ref 0.0–0.5)
Eosinophils Relative: 1 %
HCT: 42.5 % (ref 36.0–46.0)
Hemoglobin: 13.9 g/dL (ref 12.0–15.0)
Immature Granulocytes: 0 %
Lymphocytes Relative: 30 %
Lymphs Abs: 1 10*3/uL (ref 0.7–4.0)
MCH: 37.5 pg — ABNORMAL HIGH (ref 26.0–34.0)
MCHC: 32.7 g/dL (ref 30.0–36.0)
MCV: 114.6 fL — ABNORMAL HIGH (ref 80.0–100.0)
Monocytes Absolute: 0.6 10*3/uL (ref 0.1–1.0)
Monocytes Relative: 16 %
Neutro Abs: 1.8 10*3/uL (ref 1.7–7.7)
Neutrophils Relative %: 52 %
Platelets: 104 10*3/uL — ABNORMAL LOW (ref 150–400)
RBC: 3.71 MIL/uL — ABNORMAL LOW (ref 3.87–5.11)
RDW: 13.3 % (ref 11.5–15.5)
WBC: 3.5 10*3/uL — ABNORMAL LOW (ref 4.0–10.5)
nRBC: 0 % (ref 0.0–0.2)

## 2018-12-26 ENCOUNTER — Encounter: Payer: Self-pay | Admitting: Oncology

## 2018-12-26 ENCOUNTER — Inpatient Hospital Stay (HOSPITAL_BASED_OUTPATIENT_CLINIC_OR_DEPARTMENT_OTHER): Payer: Medicare Other | Admitting: Oncology

## 2018-12-26 DIAGNOSIS — C50912 Malignant neoplasm of unspecified site of left female breast: Secondary | ICD-10-CM

## 2018-12-26 DIAGNOSIS — C7800 Secondary malignant neoplasm of unspecified lung: Secondary | ICD-10-CM

## 2018-12-26 DIAGNOSIS — Z170 Estrogen receptor positive status [ER+]: Secondary | ICD-10-CM | POA: Diagnosis not present

## 2018-12-26 DIAGNOSIS — Z79899 Other long term (current) drug therapy: Secondary | ICD-10-CM | POA: Diagnosis not present

## 2018-12-26 NOTE — Progress Notes (Signed)
Per [name] that works at the [facility]. She states that pt meds has not changed in a long time. She hardly ever says a word. She has not fallen. She has lived at family care home for years. She is the same as she was the last time she was seen in our office on 02/23/18. No new info to report. The pt. Never tells the staff that she is hurting so they assume she has no pain

## 2018-12-28 NOTE — Progress Notes (Signed)
I connected with Kathy Mckay on 12/28/18 at  9:30 AM EST by video enabled telemedicine visit and verified that I am speaking with the correct person using two identifiers.   I discussed the limitations, risks, security and privacy concerns of performing an evaluation and management service by telemedicine and the availability of in-person appointments. I also discussed with the patient that there may be a patient responsible charge related to this service. The patient expressed understanding and agreed to proceed.  Other persons participating in the visit and their role in the encounter:  Patients caregiver Kathy Mckay  Patient's location:  Group home Provider's location:  Home  Diagnosis- stage IV invasive mammary carcinoma of the left breast cT4b cN1 M1 with lung metastases ER PR positive HER-2/neu negative  Chief Complaint:  Routine f/u of breast cancer on ibrance  History of present illness: patient is a 85 year old female with developmental disorder. She is a ward of the state and lives in a group home. She is here with her caregiver Ms. Tyree. She has been at the group home for the last 4 years. She does not have any living family other than a distant cousin. Her assigned social worker Ms. Glennon Mac is her healthcare proxy. Her past medical history is significant for hypertension hyperlipidemia and osteoporosis among other medical problems. Her caregiver states that overall this patient is a happy-go-lucky person and has been doing well at her nursing home. She has not had any recent falls or hospitalizations. She recently underwent a screening mammogram which picked up a mass in her left breast. Patient has not had a prior mammogram before this for years.  Mammogram and ultrasound showed 2.9 x 2.8 x 3 cm mass in the left retroareolar region. There is extension into the skin and nipple areole or complex. Associated skin thickening. At least 2 abnormal appearing left axillary lymph nodes with cortical  thickening up to 1 cm.  Core biopsy of the breast mass and the axillary lymph node revealed invasive mammary carcinoma, grade 2 ER Greater than 90% and PR 11-50% positive and HER-2/neu negative. Extracapsular extension was noted in the lymph node biopsy.   CT chest abdomen and pelvis showed left breast mass, suspicious left axillary and internal mammary adenopathy along with bilateral pulmonary nodules measuring up to 16 mm in diameter consistent with metastatic disease. No evidence of bone metastases on bone scan  Patient was started on ibrance 100 mg. She could not tolerate the dose due to refractory nausea and dose was reduced to 75 mg.Baseline tumor marker is not elevated.  Interval history : Patient is cognitive impairment and can give limited history but feels well overall and denies any complaints.  Her caregiver Kathy Mckay also does not report any specific concerns at the group home.  She has been compliant with Ibrance and tamoxifen and tolerating it well without any significant side effects   Review of Systems  Constitutional: Negative for chills, fever, malaise/fatigue and weight loss.  HENT: Negative for congestion, ear discharge and nosebleeds.   Eyes: Negative for blurred vision.  Respiratory: Negative for cough, hemoptysis, sputum production, shortness of breath and wheezing.   Cardiovascular: Negative for chest pain, palpitations, orthopnea and claudication.  Gastrointestinal: Negative for abdominal pain, blood in stool, constipation, diarrhea, heartburn, melena, nausea and vomiting.  Genitourinary: Negative for dysuria, flank pain, frequency, hematuria and urgency.  Musculoskeletal: Negative for back pain, joint pain and myalgias.  Skin: Negative for rash.  Neurological: Negative for dizziness, tingling, focal weakness, seizures, weakness and  headaches.  Endo/Heme/Allergies: Does not bruise/bleed easily.  Psychiatric/Behavioral: Negative for depression and suicidal ideas. The  patient does not have insomnia.     No Known Allergies  Past Medical History:  Diagnosis Date  . Breast cancer ([place])   . Cancer ([place])   . Developmental delay   . GERD (gastroesophageal reflux disease)   . Hyperlipidemia   . Hypertension   . Osteoporosis     Past Surgical History:  Procedure Laterality Date  . ABDOMINAL HYSTERECTOMY    . BREAST BIOPSY Left 07/19/2017   invasive mammary carcinoma  . BREAST BIOPSY Left 07/19/2017   invasive mammary carcinoma  . SHOULDER SURGERY      Social History   Socioeconomic History  . Marital status: Single    Spouse name: Not on file  . Number of children: Not on file  . Years of education: Not on file  . Highest education level: Not on file  Occupational History  . Not on file  Tobacco Use  . Smoking status: Never Smoker  . Smokeless tobacco: Never Used  Substance and Sexual Activity  . Alcohol use: No  . Drug use: No  . Sexual activity: Never  Other Topics Concern  . Not on file  Social History Narrative   Lives at a family care home. No living relatives. Guardian of the state.      Lives [place] [facility] for past 6 years.    Social Determinants of Health   Financial Resource Strain:   . Difficulty of Paying Living Expenses: Not on file  Food Insecurity:   . Worried About Charity fundraiser in the Last Year: Not on file  . Ran Out of Food in the Last Year: Not on file  Transportation Needs:   . Lack of Transportation (Medical): Not on file  . Lack of Transportation (Non-Medical): Not on file  Physical Activity:   . Days of Exercise per Week: Not on file  . Minutes of Exercise per Session: Not on file  Stress:   . Feeling of Stress : Not on file  Social Connections:   . Frequency of Communication with Friends and Family: Not on file  . Frequency of Social Gatherings with Friends and Family: Not on file  . Attends Religious Services: Not on file  . Active Member of Clubs or Organizations: Not on file  .  Attends Archivist Meetings: Not on file  . Marital Status: Not on file  Intimate Partner Violence:   . Fear of Current or Ex-Partner: Not on file  . Emotionally Abused: Not on file  . Physically Abused: Not on file  . Sexually Abused: Not on file    Family History  Problem Relation Age of Onset  . Diabetes Mother   . Diabetes Father   . Cancer Sister      Current Outpatient Medications:  .  amLODipine (NORVASC) 10 MG tablet, TAKE 1 TABLET BY MOUTH DAILY, Disp: 90 tablet, Rfl: 1 .  atorvastatin (LIPITOR) 40 MG tablet, TAKE 1 TABLET BY MOUTH AT BEDTIME FOR CHOLESTEROL, Disp: 90 tablet, Rfl: 1 .  esomeprazole (NEXIUM) 40 MG capsule, TAKE 1 CAPSULE BY MOUTH EVERY DAY AT 8:00 AM, Disp: 90 capsule, Rfl: 0 .  IBRANCE 75 MG tablet, TAKE 1 TABLET (75 MG TOTAL) BY MOUTH DAILY. TAKE FOR 21 DAYS ON, 7 DAYS OFF, REPEAT EVERY 28 DAYS., Disp: 21 tablet, Rfl: 1 .  Menthol, Topical Analgesic, (BIOFREEZE) 4 % GEL, 1 application to painful  area every 4 hours as needed, Disp: 89 mL, Rfl: 2 .  mirtazapine (REMERON) 45 MG tablet, TAKE 1 TABLET BY MOUTH AT BEDTIME, Disp: 90 tablet, Rfl: 0 .  polyethylene glycol powder (GLYCOLAX/MIRALAX) 17 GM/SCOOP powder, Take 17 g by mouth daily as needed for mild constipation., Disp: 500 g, Rfl: 0 .  prochlorperazine (COMPAZINE) 10 MG tablet, TAKE 1 TABLET BY MOUTH EVERY 8 HOURS AS NEEDED FOR NAUSEA OR VOMITING., Disp: 30 tablet, Rfl: 0 .  tamoxifen (NOLVADEX) 20 MG tablet, TAKE 1 TABLET BY MOUTH ONCE DAILY, Disp: 30 tablet, Rfl: 3  No results found.  No images are attached to the encounter.   CMP Latest Ref Rng & Units 09/03/2018  Glucose 70 - 99 mg/dL 165(H)  BUN 8 - 23 mg/dL 19  Creatinine 0.44 - 1.00 mg/dL 0.76  Sodium 135 - 145 mmol/L 138  Potassium 3.5 - 5.1 mmol/L 3.8  Chloride 98 - 111 mmol/L 104  CO2 22 - 32 mmol/L 23  Calcium 8.9 - 10.3 mg/dL 9.1  Total Protein 6.5 - 8.1 g/dL 7.6  Total Bilirubin 0.3 - 1.2 mg/dL 0.8  Alkaline Phos 38 - 126  U/L 80  AST 15 - 41 U/L 36  ALT 0 - 44 U/L 27   CBC Latest Ref Rng & Units 12/25/2018  WBC 4.0 - 10.5 K/uL 3.5(L)  Hemoglobin 12.0 - 15.0 g/dL 13.9  Hematocrit 36.0 - 46.0 % 42.5  Platelets 150 - 400 K/uL 104(L)     Observation/objective: Appears in no acute distress of a video visit today.  Breathing is nonlabored  Assessment and plan: Patient is a 85 year old female withinvasive mammary carcinoma of the left breast stage IV cT4b cN1 M1 ER PR positive HER-2/neu negative with lung metastases.  This is a visit to assess tolerance to [facility] and ongoing follow-up of breast cancer  Recent CBC from 12/25/2018 shows mild leukopenia but ANC remains greater than 1.  She also has mild thrombocytopenia which is overall stable.  Suspect macrocytosis secondary to Ibrance.  It is difficult for the patient to come and get her blood work checked routinely.  She also is in a group home and they are scared about Covid risk with every blood work.  Patient will continue Ibrance at 75 mg 3 weeks on 1 week off along with tamoxifen.  I will consider repeating CT scans in 3 to 4 months time  Follow-up instructions: Labs: CBC with differential, CMP and tumor markers followed by video visit in 2 months  I discussed the assessment and treatment plan with the patient. The patient was provided an opportunity to ask questions and all were answered. The patient agreed with the plan and demonstrated an understanding of the instructions.   The patient was advised to call back or seek an in-person evaluation if the symptoms worsen or if the condition fails to improve as anticipated.   Visit Diagnosis: 1. High risk medication use   2. Malignant neoplasm of left breast in female, estrogen receptor positive, unspecified site of breast ([place])   3. Malignant neoplasm metastatic to lung, unspecified laterality [facility])     Dr. Randa Evens, MD, MPH [facility] at [facility] [phone] 12/28/2018 12:13  PM

## 2019-01-16 MED FILL — IBRANCE 75 MG TABS: 75 | 28 days supply | Qty: 21 | Fill #1

## 2019-01-31 ENCOUNTER — Other Ambulatory Visit: Payer: Self-pay | Admitting: Family

## 2019-02-08 ENCOUNTER — Other Ambulatory Visit: Payer: Self-pay | Admitting: Oncology

## 2019-02-08 DIAGNOSIS — Z170 Estrogen receptor positive status [ER+]: Secondary | ICD-10-CM

## 2019-02-08 DIAGNOSIS — C50912 Malignant neoplasm of unspecified site of left female breast: Secondary | ICD-10-CM

## 2019-02-08 NOTE — Telephone Encounter (Signed)
CBC with Differential/Platelet Order: [id number] Status:  Final result  Visible to patient:  Yes (MyChart)  Next appt:  02/26/2019 at 10:45 AM in Oncology ([facility])  Dx:  High risk medication use; Malignant n...  Ref Range & Units 1 mo ago  WBC 4.0 - 10.5 K/uL 3.5Low    RBC 3.87 - 5.11 MIL/uL 3.71Low    Hemoglobin 12.0 - 15.0 g/dL 13.9   HCT 36.0 - 46.0 % 42.5   MCV 80.0 - 100.0 fL 114.6High    MCH 26.0 - 34.0 pg 37.5High    MCHC 30.0 - 36.0 g/dL 32.7   RDW 11.5 - 15.5 % 13.3   Platelets 150 - 400 K/uL 104Low    nRBC 0.0 - 0.2 % 0.0   Neutrophils Relative % % 52   Neutro Abs 1.7 - 7.7 K/uL 1.8   Lymphocytes Relative % 30   Lymphs Abs 0.7 - 4.0 K/uL 1.0   Monocytes Relative % 16   Monocytes Absolute 0.1 - 1.0 K/uL 0.6   Eosinophils Relative % 1   Eosinophils Absolute 0.0 - 0.5 K/uL 0.0   Basophils Relative % 1   Basophils Absolute 0.0 - 0.1 K/uL 0.0   Immature Granulocytes % 0   Abs Immature Granulocytes 0.00 - 0.07 K/uL 0.01   Comment: Performed at [facility], [address] [place], [place] [address]  Resulting Agency  [facility]      Specimen Collected: 12/25/18 11:12  Last Resulted: 12/25/18 11:20

## 2019-02-13 MED FILL — IBRANCE 75 MG TABS: 75 | 28 days supply | Qty: 21 | Fill #0

## 2019-02-22 ENCOUNTER — Telehealth: Payer: Self-pay | Admitting: *Deleted

## 2019-02-22 NOTE — Telephone Encounter (Signed)
Called [name] at [facility] to give her a reminder that pt has labs next tues 10:45 and then next Thursday to have my chart video at 9:30.  She is agreeable to this and if she has problems she will call us back.

## 2019-02-26 ENCOUNTER — Inpatient Hospital Stay: Payer: Medicare Other

## 2019-02-27 ENCOUNTER — Inpatient Hospital Stay: Payer: Medicare Other

## 2019-02-28 ENCOUNTER — Inpatient Hospital Stay: Payer: Medicare Other | Admitting: Oncology

## 2019-03-01 ENCOUNTER — Other Ambulatory Visit: Payer: Self-pay

## 2019-03-04 ENCOUNTER — Other Ambulatory Visit: Payer: Self-pay | Admitting: Oncology

## 2019-03-04 ENCOUNTER — Other Ambulatory Visit: Payer: Self-pay

## 2019-03-04 ENCOUNTER — Inpatient Hospital Stay: Payer: Medicare Other | Attending: Oncology

## 2019-03-04 ENCOUNTER — Other Ambulatory Visit: Payer: Self-pay | Admitting: Family

## 2019-03-04 DIAGNOSIS — Z170 Estrogen receptor positive status [ER+]: Secondary | ICD-10-CM | POA: Insufficient documentation

## 2019-03-04 DIAGNOSIS — C50912 Malignant neoplasm of unspecified site of left female breast: Secondary | ICD-10-CM | POA: Diagnosis not present

## 2019-03-04 DIAGNOSIS — Z79899 Other long term (current) drug therapy: Secondary | ICD-10-CM | POA: Insufficient documentation

## 2019-03-04 LAB — CBC WITH DIFFERENTIAL/PLATELET
Abs Immature Granulocytes: 0 10*3/uL (ref 0.00–0.07)
Band Neutrophils: 2 %
Basophils Absolute: 0 10*3/uL (ref 0.0–0.1)
Basophils Relative: 0 %
Eosinophils Absolute: 0.1 10*3/uL (ref 0.0–0.5)
Eosinophils Relative: 2 %
HCT: 41.2 % (ref 36.0–46.0)
Hemoglobin: 13.7 g/dL (ref 12.0–15.0)
Lymphocytes Relative: 26 %
Lymphs Abs: 1.2 10*3/uL (ref 0.7–4.0)
MCH: 37.8 pg — ABNORMAL HIGH (ref 26.0–34.0)
MCHC: 33.3 g/dL (ref 30.0–36.0)
MCV: 113.8 fL — ABNORMAL HIGH (ref 80.0–100.0)
Monocytes Absolute: 0.3 10*3/uL (ref 0.1–1.0)
Monocytes Relative: 6 %
Neutro Abs: 3 10*3/uL (ref 1.7–7.7)
Neutrophils Relative %: 64 %
Platelets: 197 10*3/uL (ref 150–400)
RBC: 3.62 MIL/uL — ABNORMAL LOW (ref 3.87–5.11)
RDW: 13.6 % (ref 11.5–15.5)
Smear Review: NORMAL
WBC: 4.6 10*3/uL (ref 4.0–10.5)
nRBC: 0 % (ref 0.0–0.2)

## 2019-03-04 LAB — COMPREHENSIVE METABOLIC PANEL
ALT: 38 U/L (ref 0–44)
AST: 36 U/L (ref 15–41)
Albumin: 4 g/dL (ref 3.5–5.0)
Alkaline Phosphatase: 98 U/L (ref 38–126)
Anion gap: 11 (ref 5–15)
BUN: 29 mg/dL — ABNORMAL HIGH (ref 8–23)
CO2: 23 mmol/L (ref 22–32)
Calcium: 8.9 mg/dL (ref 8.9–10.3)
Chloride: 106 mmol/L (ref 98–111)
Creatinine, Ser: 0.79 mg/dL (ref 0.44–1.00)
GFR calc Af Amer: >60 mL/min (ref >60)
GFR calc non Af Amer: >60 mL/min (ref >60)
Glucose, Bld: 112 mg/dL — ABNORMAL HIGH (ref 70–99)
Potassium: 3.6 mmol/L (ref 3.5–5.1)
Sodium: 140 mmol/L (ref 135–145)
Total Bilirubin: 0.7 mg/dL (ref 0.3–1.2)
Total Protein: 7.1 g/dL (ref 6.5–8.1)

## 2019-03-05 LAB — CANCER ANTIGEN 15-3: CA 15-3: 27.6 U/mL — ABNORMAL HIGH (ref 0.0–25.0)

## 2019-03-11 ENCOUNTER — Other Ambulatory Visit: Payer: Self-pay

## 2019-03-11 ENCOUNTER — Encounter: Payer: Self-pay | Admitting: Oncology

## 2019-03-11 ENCOUNTER — Inpatient Hospital Stay: Payer: Medicare Other | Attending: Oncology | Admitting: Oncology

## 2019-03-11 DIAGNOSIS — C7800 Secondary malignant neoplasm of unspecified lung: Secondary | ICD-10-CM | POA: Diagnosis not present

## 2019-03-11 DIAGNOSIS — Z170 Estrogen receptor positive status [ER+]: Secondary | ICD-10-CM

## 2019-03-11 DIAGNOSIS — Z79810 Long term (current) use of selective estrogen receptor modulators (SERMs): Secondary | ICD-10-CM

## 2019-03-11 DIAGNOSIS — Z79899 Other long term (current) drug therapy: Secondary | ICD-10-CM | POA: Diagnosis not present

## 2019-03-11 DIAGNOSIS — C50912 Malignant neoplasm of unspecified site of left female breast: Secondary | ICD-10-CM | POA: Diagnosis not present

## 2019-03-11 NOTE — Progress Notes (Signed)
Patient's caregiver-Linda stated that the patient had been doing well with no complaints. Patient's last bone density was on 08/21/2017.

## 2019-03-12 NOTE — Progress Notes (Signed)
I connected with Kathy Mckay on 03/12/19 at 11:30 AM EST by video enabled telemedicine visit and verified that I am speaking with the correct person using two identifiers.   I discussed the limitations, risks, security and privacy concerns of performing an evaluation and management service by telemedicine and the availability of in-person appointments. I also discussed with the patient that there may be a patient responsible charge related to this service. The patient expressed understanding and agreed to proceed.  Other persons participating in the visit and their role in the encounter:  Patients caregiver at group home [facility]  Patient cannot give any history due to developmental disorder  Patient's location:  Group home Provider's location:  work  Risk analyst Complaint:  Routine f/u of breast cancer  History of present illness: patient is a 86 year old female with developmental disorder. She is a ward of the state and lives in a group home. She is here with her caregiver Ms. Tyree. She has been at the group home for the last 4 years. She does not have any living family other than a distant cousin. Her assigned social worker Ms. Glennon Mac is her healthcare proxy. Her past medical history is significant for hypertension hyperlipidemia and osteoporosis among other medical problems. Her caregiver states that overall this patient is a happy-go-lucky person and has been doing well at her nursing home. She has not had any recent falls or hospitalizations. She recently underwent a screening mammogram which picked up a mass in her left breast. Patient has not had a prior mammogram before this for years.  Mammogram and ultrasound showed 2.9 x 2.8 x 3 cm mass in the left retroareolar region. There is extension into the skin and nipple areole or complex. Associated skin thickening. At least 2 abnormal appearing left axillary lymph nodes with cortical thickening up to 1 cm.  Core biopsy of the breast mass and the  axillary lymph node revealed invasive mammary carcinoma, grade 2 ER Greater than 90% and PR 11-50% positive and HER-2/neu negative. Extracapsular extension was noted in the lymph node biopsy.   CT chest abdomen and pelvis showed left breast mass, suspicious left axillary and internal mammary adenopathy along with bilateral pulmonary nodules measuring up to 16 mm in diameter consistent with metastatic disease. No evidence of bone metastases on bone scan  Patient was started on ibrance 100 mg. She could not tolerate the dose due to refractory nausea and dose was reduced to 75 mg.Baseline tumor marker is not elevated.  Interval history. Patients caregiver reports left breast lesion has scant discharge at times. Patients health status unchanged. She has been eating normally. No weight loss. No hospitalizations   Review of Systems  Unable to perform ROS: Psychiatric disorder    No Known Allergies  Past Medical History:  Diagnosis Date  . Breast cancer ([place])   . Cancer ([place])   . Developmental delay   . GERD (gastroesophageal reflux disease)   . Hyperlipidemia   . Hypertension   . Osteoporosis     Past Surgical History:  Procedure Laterality Date  . ABDOMINAL HYSTERECTOMY    . BREAST BIOPSY Left 07/19/2017   invasive mammary carcinoma  . BREAST BIOPSY Left 07/19/2017   invasive mammary carcinoma  . SHOULDER SURGERY      Social History   Socioeconomic History  . Marital status: Single    Spouse name: Not on file  . Number of children: Not on file  . Years of education: Not on file  . Highest education  level: Not on file  Occupational History  . Not on file  Tobacco Use  . Smoking status: Never Smoker  . Smokeless tobacco: Never Used  Substance and Sexual Activity  . Alcohol use: No  . Drug use: No  . Sexual activity: Never  Other Topics Concern  . Not on file  Social History Narrative   Lives at a family care home. No living relatives. Guardian of the state.       Lives [place] [facility] [place] for past 6 years.    Social Determinants of Health   Financial Resource Strain:   . Difficulty of Paying Living Expenses: Not on file  Food Insecurity:   . Worried About Charity fundraiser in the Last Year: Not on file  . Ran Out of Food in the Last Year: Not on file  Transportation Needs:   . Lack of Transportation (Medical): Not on file  . Lack of Transportation (Non-Medical): Not on file  Physical Activity:   . Days of Exercise per Week: Not on file  . Minutes of Exercise per Session: Not on file  Stress:   . Feeling of Stress : Not on file  Social Connections:   . Frequency of Communication with Friends and Family: Not on file  . Frequency of Social Gatherings with Friends and Family: Not on file  . Attends Religious Services: Not on file  . Active Member of Clubs or Organizations: Not on file  . Attends Archivist Meetings: Not on file  . Marital Status: Not on file  Intimate Partner Violence:   . Fear of Current or Ex-Partner: Not on file  . Emotionally Abused: Not on file  . Physically Abused: Not on file  . Sexually Abused: Not on file    Family History  Problem Relation Age of Onset  . Diabetes Mother   . Diabetes Father   . Cancer Sister      Current Outpatient Medications:  .  amLODipine (NORVASC) 10 MG tablet, TAKE 1 TABLET BY MOUTH DAILY, Disp: 90 tablet, Rfl: 1 .  atorvastatin (LIPITOR) 40 MG tablet, TAKE 1 TABLET BY MOUTH AT BEDTIME FOR CHOLESTEROL, Disp: 90 tablet, Rfl: 1 .  esomeprazole (NEXIUM) 40 MG capsule, TAKE 1 CAPSULE BY MOUTH EVERY DAY AT 8:00 AM, Disp: 30 capsule, Rfl: 0 .  IBRANCE 75 MG tablet, TAKE 1 TABLET (75 MG TOTAL) BY MOUTH DAILY. TAKE FOR 21 DAYS ON, 7 DAYS OFF, REPEAT EVERY 28 DAYS., Disp: 21 tablet, Rfl: 1 .  Menthol, Topical Analgesic, (BIOFREEZE) 4 % GEL, 1 application to painful area every 4 hours as needed, Disp: 89 mL, Rfl: 2 .  mirtazapine (REMERON) 45 MG tablet, TAKE 1 TABLET BY MOUTH AT BEDTIME,  Disp: 90 tablet, Rfl: 0 .  polyethylene glycol powder (GLYCOLAX/MIRALAX) 17 GM/SCOOP powder, Take 17 g by mouth daily as needed for mild constipation., Disp: 500 g, Rfl: 0 .  tamoxifen (NOLVADEX) 20 MG tablet, TAKE 1 TABLET BY MOUTH ONCE DAILY, Disp: 30 tablet, Rfl: 3 .  prochlorperazine (COMPAZINE) 10 MG tablet, TAKE 1 TABLET BY MOUTH EVERY 8 HOURS AS NEEDED FOR NAUSEA OR VOMITING. (Patient not taking: Reported on 03/11/2019), Disp: 30 tablet, Rfl: 0  No results found.  No images are attached to the encounter.   CMP Latest Ref Rng & Units 03/04/2019  Glucose 70 - 99 mg/dL 112(H)  BUN 8 - 23 mg/dL 29(H)  Creatinine 0.44 - 1.00 mg/dL 0.79  Sodium 135 - 145 mmol/L 140  Potassium 3.5 - 5.1 mmol/L 3.6  Chloride 98 - 111 mmol/L 106  CO2 22 - 32 mmol/L 23  Calcium 8.9 - 10.3 mg/dL 8.9  Total Protein 6.5 - 8.1 g/dL 7.1  Total Bilirubin 0.3 - 1.2 mg/dL 0.7  Alkaline Phos 38 - 126 U/L 98  AST 15 - 41 U/L 36  ALT 0 - 44 U/L 38   CBC Latest Ref Rng & Units 03/04/2019  WBC 4.0 - 10.5 K/uL 4.6  Hemoglobin 12.0 - 15.0 g/dL 13.7  Hematocrit 36.0 - 46.0 % 41.2  Platelets 150 - 400 K/uL 197    Assessment and plan:Patient is a 86 year old female withinvasive mammary carcinoma of the left breast stage IV cT4b cN1 M1 ER PR positive HER-2/neu negative with lung metastases. This is a routine f/u visit of breast cancer on ibrance and tamoxifen  Given the concern for breast wound drainage, I will obtain systemic scans including ct chest abdomen pelvis and bone scan within 2 months  I will see her for in person visit with labs: cbc with diff/cmp and ca 15-3  She will continue with ibrance and tamoxifen for now. ANC >1. Macrocytosis due to ibrance. Tumor markers have remained stable  Follow-up instructions: as above  I discussed the assessment and treatment plan with the patient. The patient was provided an opportunity to ask questions and all were answered. The patient agreed with the plan and  demonstrated an understanding of the instructions.   The patient was advised to call back or seek an in-person evaluation if the symptoms worsen or if the condition fails to improve as anticipated.    Visit Diagnosis: 1. Malignant neoplasm of left breast in female, estrogen receptor positive, unspecified site of breast ([place])   2. High risk medication use   3. Malignant neoplasm metastatic to lung, unspecified laterality ([place])   4. Long-term current use of tamoxifen     Dr. Randa Evens, MD, MPH [facility] at [facility] Tel- [phone] 03/12/2019 8:22 AM

## 2019-03-18 MED FILL — IBRANCE 75 MG TABS: 75 | 28 days supply | Qty: 21 | Fill #1

## 2019-03-29 ENCOUNTER — Other Ambulatory Visit: Payer: Self-pay | Admitting: Family

## 2019-04-09 ENCOUNTER — Other Ambulatory Visit: Payer: Self-pay | Admitting: Oncology

## 2019-04-09 DIAGNOSIS — C50912 Malignant neoplasm of unspecified site of left female breast: Secondary | ICD-10-CM

## 2019-04-10 MED FILL — IBRANCE 75 MG TABS: 75 | 28 days supply | Qty: 21 | Fill #0

## 2019-04-19 ENCOUNTER — Inpatient Hospital Stay: Payer: Medicare Other | Attending: Oncology

## 2019-04-19 ENCOUNTER — Ambulatory Visit: Admission: RE | Admit: 2019-04-19 | Payer: Medicare Other | Source: Ambulatory Visit

## 2019-04-19 ENCOUNTER — Other Ambulatory Visit: Payer: Self-pay

## 2019-04-19 DIAGNOSIS — D510 Vitamin B12 deficiency anemia due to intrinsic factor deficiency: Secondary | ICD-10-CM

## 2019-04-19 DIAGNOSIS — Z79899 Other long term (current) drug therapy: Secondary | ICD-10-CM | POA: Insufficient documentation

## 2019-04-19 DIAGNOSIS — Z170 Estrogen receptor positive status [ER+]: Secondary | ICD-10-CM | POA: Diagnosis not present

## 2019-04-19 DIAGNOSIS — C50912 Malignant neoplasm of unspecified site of left female breast: Secondary | ICD-10-CM | POA: Diagnosis not present

## 2019-04-19 LAB — CBC WITH DIFFERENTIAL/PLATELET
Abs Immature Granulocytes: 0.02 10*3/uL (ref 0.00–0.07)
Basophils Absolute: 0.1 10*3/uL (ref 0.0–0.1)
Basophils Relative: 1 %
Eosinophils Absolute: 0.1 10*3/uL (ref 0.0–0.5)
Eosinophils Relative: 1 %
HCT: 40.9 % (ref 36.0–46.0)
Hemoglobin: 14.2 g/dL (ref 12.0–15.0)
Immature Granulocytes: 0 %
Lymphocytes Relative: 21 %
Lymphs Abs: 1.1 10*3/uL (ref 0.7–4.0)
MCH: 38.3 pg — ABNORMAL HIGH (ref 26.0–34.0)
MCHC: 34.7 g/dL (ref 30.0–36.0)
MCV: 110.2 fL — ABNORMAL HIGH (ref 80.0–100.0)
Monocytes Absolute: 0.8 10*3/uL (ref 0.1–1.0)
Monocytes Relative: 15 %
Neutro Abs: 3.2 10*3/uL (ref 1.7–7.7)
Neutrophils Relative %: 62 %
Platelets: 133 10*3/uL — ABNORMAL LOW (ref 150–400)
RBC: 3.71 MIL/uL — ABNORMAL LOW (ref 3.87–5.11)
RDW: 13.5 % (ref 11.5–15.5)
WBC: 5.2 10*3/uL (ref 4.0–10.5)
nRBC: 0 % (ref 0.0–0.2)

## 2019-04-19 LAB — COMPREHENSIVE METABOLIC PANEL
ALT: 24 U/L (ref 0–44)
AST: 31 U/L (ref 15–41)
Albumin: 3.9 g/dL (ref 3.5–5.0)
Alkaline Phosphatase: 77 U/L (ref 38–126)
Anion gap: 13 (ref 5–15)
BUN: 22 mg/dL (ref 8–23)
CO2: 25 mmol/L (ref 22–32)
Calcium: 8.8 mg/dL — ABNORMAL LOW (ref 8.9–10.3)
Chloride: 101 mmol/L (ref 98–111)
Creatinine, Ser: 0.75 mg/dL (ref 0.44–1.00)
GFR calc Af Amer: >60 mL/min (ref >60)
GFR calc non Af Amer: >60 mL/min (ref >60)
Glucose, Bld: 135 mg/dL — ABNORMAL HIGH (ref 70–99)
Potassium: 3.1 mmol/L — ABNORMAL LOW (ref 3.5–5.1)
Sodium: 139 mmol/L (ref 135–145)
Total Bilirubin: 0.9 mg/dL (ref 0.3–1.2)
Total Protein: 7.2 g/dL (ref 6.5–8.1)

## 2019-04-29 ENCOUNTER — Other Ambulatory Visit: Payer: Self-pay | Admitting: Family

## 2019-05-01 ENCOUNTER — Encounter
Admission: RE | Admit: 2019-05-01 | Discharge: 2019-05-01 | Disposition: A | Discharge status: Home / Self Care | Payer: Medicare Other | Source: Ambulatory Visit | Attending: Oncology | Admitting: Oncology

## 2019-05-01 ENCOUNTER — Ambulatory Visit: Admission: RE | Admit: 2019-05-01 | Payer: Medicare Other | Source: Ambulatory Visit

## 2019-05-01 ENCOUNTER — Encounter: Admission: RE | Admit: 2019-05-01 | Payer: Medicare Other | Source: Ambulatory Visit

## 2019-05-01 ENCOUNTER — Other Ambulatory Visit: Payer: Self-pay

## 2019-05-01 DIAGNOSIS — C50912 Malignant neoplasm of unspecified site of left female breast: Secondary | ICD-10-CM

## 2019-05-01 DIAGNOSIS — Z170 Estrogen receptor positive status [ER+]: Secondary | ICD-10-CM | POA: Insufficient documentation

## 2019-05-03 ENCOUNTER — Other Ambulatory Visit: Payer: Medicare Other

## 2019-05-08 MED FILL — IBRANCE 75 MG TABS: 75 | 28 days supply | Qty: 21 | Fill #1

## 2019-05-13 ENCOUNTER — Other Ambulatory Visit: Payer: Self-pay

## 2019-05-13 ENCOUNTER — Inpatient Hospital Stay: Payer: Medicare Other | Attending: Oncology

## 2019-05-13 ENCOUNTER — Inpatient Hospital Stay (HOSPITAL_BASED_OUTPATIENT_CLINIC_OR_DEPARTMENT_OTHER): Payer: Medicare Other | Admitting: Oncology

## 2019-05-13 VITALS — BP 157/84 | HR 98 | Temp 95.4°F | Resp 16 | Wt 83.8 lb

## 2019-05-13 DIAGNOSIS — Z79899 Other long term (current) drug therapy: Secondary | ICD-10-CM | POA: Insufficient documentation

## 2019-05-13 DIAGNOSIS — D696 Thrombocytopenia, unspecified: Secondary | ICD-10-CM | POA: Insufficient documentation

## 2019-05-13 DIAGNOSIS — Z170 Estrogen receptor positive status [ER+]: Secondary | ICD-10-CM | POA: Insufficient documentation

## 2019-05-13 DIAGNOSIS — C50012 Malignant neoplasm of nipple and areola, left female breast: Secondary | ICD-10-CM | POA: Diagnosis not present

## 2019-05-13 DIAGNOSIS — D7589 Other specified diseases of blood and blood-forming organs: Secondary | ICD-10-CM | POA: Diagnosis not present

## 2019-05-13 DIAGNOSIS — C7801 Secondary malignant neoplasm of right lung: Secondary | ICD-10-CM | POA: Insufficient documentation

## 2019-05-13 DIAGNOSIS — Z809 Family history of malignant neoplasm, unspecified: Secondary | ICD-10-CM | POA: Diagnosis not present

## 2019-05-13 DIAGNOSIS — C50912 Malignant neoplasm of unspecified site of left female breast: Secondary | ICD-10-CM

## 2019-05-13 DIAGNOSIS — C7802 Secondary malignant neoplasm of left lung: Secondary | ICD-10-CM | POA: Insufficient documentation

## 2019-05-13 DIAGNOSIS — F99 Mental disorder, not otherwise specified: Secondary | ICD-10-CM | POA: Diagnosis not present

## 2019-05-13 DIAGNOSIS — Z833 Family history of diabetes mellitus: Secondary | ICD-10-CM | POA: Diagnosis not present

## 2019-05-13 DIAGNOSIS — D72819 Decreased white blood cell count, unspecified: Secondary | ICD-10-CM | POA: Diagnosis not present

## 2019-05-13 LAB — CBC WITH DIFFERENTIAL/PLATELET
Abs Immature Granulocytes: 0.02 10*3/uL (ref 0.00–0.07)
Basophils Absolute: 0 10*3/uL (ref 0.0–0.1)
Basophils Relative: 1 %
Eosinophils Absolute: 0 10*3/uL (ref 0.0–0.5)
Eosinophils Relative: 1 %
HCT: 42.4 % (ref 36.0–46.0)
Hemoglobin: 14.1 g/dL (ref 12.0–15.0)
Immature Granulocytes: 1 %
Lymphocytes Relative: 18 %
Lymphs Abs: 0.6 10*3/uL — ABNORMAL LOW (ref 0.7–4.0)
MCH: 37.5 pg — ABNORMAL HIGH (ref 26.0–34.0)
MCHC: 33.3 g/dL (ref 30.0–36.0)
MCV: 112.8 fL — ABNORMAL HIGH (ref 80.0–100.0)
Monocytes Absolute: 0.2 10*3/uL (ref 0.1–1.0)
Monocytes Relative: 7 %
Neutro Abs: 2.4 10*3/uL (ref 1.7–7.7)
Neutrophils Relative %: 72 %
Platelets: 102 10*3/uL — ABNORMAL LOW (ref 150–400)
RBC: 3.76 MIL/uL — ABNORMAL LOW (ref 3.87–5.11)
RDW: 13.7 % (ref 11.5–15.5)
WBC: 3.3 10*3/uL — ABNORMAL LOW (ref 4.0–10.5)
nRBC: 0 % (ref 0.0–0.2)

## 2019-05-13 LAB — COMPREHENSIVE METABOLIC PANEL
ALT: 20 U/L (ref 0–44)
AST: 28 U/L (ref 15–41)
Albumin: 3.8 g/dL (ref 3.5–5.0)
Alkaline Phosphatase: 97 U/L (ref 38–126)
Anion gap: 11 (ref 5–15)
BUN: 23 mg/dL (ref 8–23)
CO2: 25 mmol/L (ref 22–32)
Calcium: 8.7 mg/dL — ABNORMAL LOW (ref 8.9–10.3)
Chloride: 105 mmol/L (ref 98–111)
Creatinine, Ser: 0.78 mg/dL (ref 0.44–1.00)
GFR calc Af Amer: >60 mL/min (ref >60)
GFR calc non Af Amer: >60 mL/min (ref >60)
Glucose, Bld: 258 mg/dL — ABNORMAL HIGH (ref 70–99)
Potassium: 3.1 mmol/L — ABNORMAL LOW (ref 3.5–5.1)
Sodium: 141 mmol/L (ref 135–145)
Total Bilirubin: 0.7 mg/dL (ref 0.3–1.2)
Total Protein: 7.2 g/dL (ref 6.5–8.1)

## 2019-05-13 MED ORDER — OXYCODONE-ACETAMINOPHEN 5-325 MG PO TABS: 1.0000 | ORAL_TABLET | Freq: Four times a day (QID) | ORAL | 0 refills | Status: DC | PRN

## 2019-05-14 LAB — CANCER ANTIGEN 15-3: CA 15-3: 30.6 U/mL — ABNORMAL HIGH (ref 0.0–25.0)

## 2019-05-15 ENCOUNTER — Encounter: Payer: Self-pay | Admitting: Oncology

## 2019-05-15 NOTE — Progress Notes (Signed)
Hematology/Oncology Consult note [facility]  [phone] [phone]  Patient Care Team: Burnard Hawthorne, FNP as PCP - General (Family Medicine) Sindy Guadeloupe, MD as Consulting Physician (Hematology and Oncology)   Name of the patient: Kathy Mckay  [id number]  1932/03/22   Date of visit: 05/15/19  Diagnosis-ER positive metastatic breast cancer with lung metastases  Chief complaint/ Reason for visit-routine follow-up of breast cancer on tamoxifen and Ibrance  Heme/Onc history: patient is a 86 year old female with developmental disorder. She is a ward of the state and lives in a group home. She is here with her caregiver Ms. Tyree. She has been at the group home for the last 4 years. She does not have any living family other than a distant cousin. Her assigned social worker Ms. Glennon Mac is her healthcare proxy. Her past medical history is significant for hypertension hyperlipidemia and osteoporosis among other medical problems. Her caregiver states that overall this patient is a happy-go-lucky person and has been doing well at her nursing home. She has not had any recent falls or hospitalizations. She recently underwent a screening mammogram which picked up a mass in her left breast. Patient has not had a prior mammogram before this for years.  Mammogram and ultrasound showed 2.9 x 2.8 x 3 cm mass in the left retroareolar region. There is extension into the skin and nipple areole or complex. Associated skin thickening. At least 2 abnormal appearing left axillary lymph nodes with cortical thickening up to 1 cm.  Core biopsy of the breast mass and the axillary lymph node revealed invasive mammary carcinoma, grade 2 ER Greater than 90% and PR 11-50% positive and HER-2/neu negative. Extracapsular extension was noted in the lymph node biopsy.   CT chest abdomen and pelvis showed left breast mass, suspicious left axillary and internal mammary adenopathy  along with bilateral pulmonary nodules measuring up to 16 mm in diameter consistent with metastatic disease. No evidence of bone metastases on bone scan  Patient was started on ibrance 100 mg. She could not tolerate the dose due to refractory nausea and dose was reduced to 75 mg.Baseline tumor marker is not elevated.  Interval history-patient is here with a caregiver today.  Caregiver notes that patient has been not eating well and needs others to persuade her to eat.  Patient denies any nausea.  Her weight has remained stable around 83 pounds over the last 1 year.  ECOG PS- 2   Review of systems- Review of Systems  Unable to perform ROS: Psychiatric disorder     No Known Allergies   Past Medical History:  Diagnosis Date  . Breast cancer ([place])   . Cancer ([place])   . Developmental delay   . GERD (gastroesophageal reflux disease)   . Hyperlipidemia   . Hypertension   . Osteoporosis      Past Surgical History:  Procedure Laterality Date  . ABDOMINAL HYSTERECTOMY    . BREAST BIOPSY Left 07/19/2017   invasive mammary carcinoma  . BREAST BIOPSY Left 07/19/2017   invasive mammary carcinoma  . SHOULDER SURGERY      Social History   Socioeconomic History  . Marital status: Single    Spouse name: Not on file  . Number of children: Not on file  . Years of education: Not on file  . Highest education level: Not on file  Occupational History  . Not on file  Tobacco Use  . Smoking status: Never Smoker  . Smokeless  tobacco: Never Used  Substance and Sexual Activity  . Alcohol use: No  . Drug use: No  . Sexual activity: Never  Other Topics Concern  . Not on file  Social History Narrative   Lives at a family care home. No living relatives. Guardian of the state.      Lives [facility] for past 6 years.    Social Determinants of Health   Financial Resource Strain:   . Difficulty of Paying Living Expenses:   Food Insecurity:   . Worried About Charity fundraiser in  the Last Year:   . Arboriculturist in the Last Year:   Transportation Needs:   . Film/video editor (Medical):   Marland Kitchen Lack of Transportation (Non-Medical):   Physical Activity:   . Days of Exercise per Week:   . Minutes of Exercise per Session:   Stress:   . Feeling of Stress :   Social Connections:   . Frequency of Communication with Friends and Family:   . Frequency of Social Gatherings with Friends and Family:   . Attends Religious Services:   . Active Member of Clubs or Organizations:   . Attends Archivist Meetings:   Marland Kitchen Marital Status:   Intimate Partner Violence:   . Fear of Current or Ex-Partner:   . Emotionally Abused:   Marland Kitchen Physically Abused:   . Sexually Abused:     Family History  Problem Relation Age of Onset  . Diabetes Mother   . Diabetes Father   . Cancer Sister      Current Outpatient Medications:  .  amLODipine (NORVASC) 10 MG tablet, TAKE 1 TABLET BY MOUTH DAILY, Disp: 90 tablet, Rfl: 1 .  atorvastatin (LIPITOR) 40 MG tablet, TAKE 1 TABLET BY MOUTH AT BEDTIME FOR CHOLESTEROL, Disp: 90 tablet, Rfl: 1 .  esomeprazole (NEXIUM) 40 MG capsule, TAKE 1 CAPSULE BY MOUTH EVERY DAY AT 8:00 AM, Disp: 30 capsule, Rfl: 0 .  IBRANCE 75 MG tablet, TAKE 1 TABLET (75 MG TOTAL) BY MOUTH DAILY. TAKE FOR 21 DAYS ON, 7 DAYS OFF, REPEAT EVERY 28 DAYS., Disp: 21 tablet, Rfl: 1 .  Menthol, Topical Analgesic, (BIOFREEZE) 4 % GEL, 1 application to painful area every 4 hours as needed, Disp: 89 mL, Rfl: 2 .  mirtazapine (REMERON) 45 MG tablet, TAKE 1 TABLET BY MOUTH AT BEDTIME, Disp: 90 tablet, Rfl: 0 .  polyethylene glycol powder (GLYCOLAX/MIRALAX) 17 GM/SCOOP powder, Take 17 g by mouth daily as needed for mild constipation., Disp: 500 g, Rfl: 0 .  prochlorperazine (COMPAZINE) 10 MG tablet, TAKE 1 TABLET BY MOUTH EVERY 8 HOURS AS NEEDED FOR NAUSEA OR VOMITING., Disp: 30 tablet, Rfl: 0 .  tamoxifen (NOLVADEX) 20 MG tablet, TAKE 1 TABLET BY MOUTH ONCE DAILY, Disp: 30 tablet,  Rfl: 3 .  oxyCODONE-acetaminophen (PERCOCET/ROXICET) 5-325 MG tablet, Take 1 tablet by mouth every 6 (six) hours as needed for severe pain., Disp: 120 tablet, Rfl: 0  Physical exam:  Vitals:   05/13/19 1014  BP: (!) 157/84  Pulse: 98  Resp: 16  Temp: (!) 95.4 F (35.2 C)  TempSrc: Tympanic  SpO2: 97%  Weight: 83 lb 12.8 oz (38 kg)   Physical Exam Constitutional:      General: She is not in acute distress.    Comments: Thin elderly woman in no acute distress  Cardiovascular:     Rate and Rhythm: Normal rate and regular rhythm.     Heart sounds: Normal heart  sounds.  Pulmonary:     Effort: Pulmonary effort is normal.     Breath sounds: Normal breath sounds.  Abdominal:     General: Bowel sounds are normal.     Palpations: Abdomen is soft.  Skin:    General: Skin is warm and dry.  Neurological:     Mental Status: She is alert.     Comments: Oriented to self   Palpable left breast mass about 3 cm in size with overlying skin involvement.  There are no open wounds or discharge.  CMP Latest Ref Rng & Units 05/13/2019  Glucose 70 - 99 mg/dL 258(H)  BUN 8 - 23 mg/dL 23  Creatinine 0.44 - 1.00 mg/dL 0.78  Sodium 135 - 145 mmol/L 141  Potassium 3.5 - 5.1 mmol/L 3.1(L)  Chloride 98 - 111 mmol/L 105  CO2 22 - 32 mmol/L 25  Calcium 8.9 - 10.3 mg/dL 8.7(L)  Total Protein 6.5 - 8.1 g/dL 7.2  Total Bilirubin 0.3 - 1.2 mg/dL 0.7  Alkaline Phos 38 - 126 U/L 97  AST 15 - 41 U/L 28  ALT 0 - 44 U/L 20   CBC Latest Ref Rng & Units 05/13/2019  WBC 4.0 - 10.5 K/uL 3.3(L)  Hemoglobin 12.0 - 15.0 g/dL 14.1  Hematocrit 36.0 - 46.0 % 42.4  Platelets 150 - 400 K/uL 102(L)         Assessment and plan- Patient is a 86 y.o. female withinvasive mammary carcinoma of the left breast stage IV cT4b cN1 M1 ER PR positive HER-2/neu negative with lung metastases. This is a routine follow-up breast cancer on tamoxifen and Ibrance  This is currently patient's week off Ibrance.  She continues to  have mild leukopenia but her ANC is greater than 1.  Mild thrombocytopenia which is essentially stable.  Hemoglobin 14.  Macrocytosis likely secondary to Ibrance.  Tumor markers have remained stable.  Patient could not go for CT scans as she has not received approval from the supervisor at the nursing home.  Once she receives that she will be going for CT and bone scans.  CBC with differential CMP followed by in person visit in 2 months.    There is skin involvement from the left breast cancer but there are no open wounds warrant any surgery at this time.   Visit Diagnosis 1. High risk medication use   2. Malignant neoplasm of left breast in female, estrogen receptor positive, unspecified site of breast ([place])      Dr. Randa Evens, MD, MPH [facility] at [facility] [phone] 05/15/2019 5:32 PM

## 2019-05-22 DIAGNOSIS — L600 Ingrowing nail: Secondary | ICD-10-CM | POA: Diagnosis not present

## 2019-05-22 DIAGNOSIS — M2011 Hallux valgus (acquired), right foot: Secondary | ICD-10-CM | POA: Diagnosis not present

## 2019-05-22 DIAGNOSIS — M79674 Pain in right toe(s): Secondary | ICD-10-CM | POA: Diagnosis not present

## 2019-05-22 DIAGNOSIS — M79675 Pain in left toe(s): Secondary | ICD-10-CM | POA: Diagnosis not present

## 2019-05-22 DIAGNOSIS — B351 Tinea unguium: Secondary | ICD-10-CM | POA: Diagnosis not present

## 2019-05-30 ENCOUNTER — Other Ambulatory Visit: Payer: Self-pay | Admitting: Family

## 2019-05-30 ENCOUNTER — Other Ambulatory Visit: Payer: Self-pay | Admitting: Oncology

## 2019-06-03 ENCOUNTER — Other Ambulatory Visit: Payer: Self-pay | Admitting: Oncology

## 2019-06-03 DIAGNOSIS — Z170 Estrogen receptor positive status [ER+]: Secondary | ICD-10-CM

## 2019-06-04 ENCOUNTER — Other Ambulatory Visit: Payer: Self-pay | Admitting: Oncology

## 2019-06-04 ENCOUNTER — Telehealth: Payer: Self-pay | Admitting: Family

## 2019-06-04 NOTE — Telephone Encounter (Signed)
[name] called needing a verbal order for pt daily diet

## 2019-06-04 NOTE — Telephone Encounter (Signed)
I called patient's caregiver & I have faxed over order for her to have a low salt diet.

## 2019-06-06 MED FILL — IBRANCE 75 MG TABS: 75 | 28 days supply | Qty: 21 | Fill #0

## 2019-06-13 ENCOUNTER — Other Ambulatory Visit: Payer: Self-pay

## 2019-06-13 ENCOUNTER — Encounter
Admission: RE | Admit: 2019-06-13 | Discharge: 2019-06-13 | Disposition: A | Discharge status: Home / Self Care | Payer: Medicare Other | Source: Ambulatory Visit | Attending: Oncology | Admitting: Oncology

## 2019-06-13 DIAGNOSIS — C50912 Malignant neoplasm of unspecified site of left female breast: Secondary | ICD-10-CM | POA: Insufficient documentation

## 2019-06-13 DIAGNOSIS — Z170 Estrogen receptor positive status [ER+]: Secondary | ICD-10-CM | POA: Diagnosis not present

## 2019-06-13 MED ORDER — TECHNETIUM TC 99M MEDRONATE IV KIT
20.3300 | PACK | Freq: Once | INTRAVENOUS | Status: AC | PRN
  Administered 2019-06-13: 20.33 via INTRAVENOUS

## 2019-06-14 ENCOUNTER — Other Ambulatory Visit: Payer: Self-pay

## 2019-06-17 ENCOUNTER — Other Ambulatory Visit: Payer: Medicare Other

## 2019-06-17 ENCOUNTER — Ambulatory Visit: Admission: RE | Admit: 2019-06-17 | Payer: Medicare Other | Source: Ambulatory Visit

## 2019-06-28 ENCOUNTER — Other Ambulatory Visit: Payer: Self-pay | Admitting: Family

## 2019-07-02 MED FILL — IBRANCE 75 MG TABS: 75 | 28 days supply | Qty: 21 | Fill #1

## 2019-07-03 ENCOUNTER — Telehealth: Payer: Self-pay | Admitting: *Deleted

## 2019-07-03 ENCOUNTER — Telehealth: Payer: Self-pay | Admitting: Oncology

## 2019-07-03 NOTE — Telephone Encounter (Signed)
I called I called Kathy Mckay back in regards to [name].  She was want to know about her scans.  I told her that she had had the nuclear med scan but the patient did not go for the CT scan.  Kathy Mckay says that she did not know the date for the scan.  I told her that I would reschedule it and call her back.  I called her and made the appointment for July 7 and Kathy Mckay wanted to know if we can move it to the [place] location.  I called back over to scheduling and scheduled it for July 6 at 9 AM at the [facility] location.  I told Kathy Mckay should have to come and get a prep kit.  Kathy Mckay tells me that she already has 1 from the previous scan and so therefore she would be able to use that prep kit.  I told her that she has to drink 1 bottle at 7:30 AM on July 6, half a bottle at 830 on July 6.  Then take the remaining half bottle with him to the actual radiology location and they will drink the rest of it there.  Nothing to eat or drink 4 hours prior to the test.  Because the scan was not done we are going to move the appointment that the patient had after the scan.  Kathy Mckay is agreeable to that and she will come and take the patient to get labs on July 6 at 930 after she has had her scan.  Then she will have a video visit on July 8 at 245 using Kathy Mckay's cell phone.Kathy Mckay has wrote all this down and she is agreeable to the plan

## 2019-07-03 NOTE — Telephone Encounter (Signed)
[name] called requesting to speak with someone in regards to scans the patient is supposed to have.

## 2019-07-08 ENCOUNTER — Inpatient Hospital Stay: Payer: Medicare Other

## 2019-07-09 ENCOUNTER — Inpatient Hospital Stay: Payer: Medicare Other | Admitting: Oncology

## 2019-07-16 ENCOUNTER — Inpatient Hospital Stay: Payer: Medicare Other | Attending: Oncology

## 2019-07-16 ENCOUNTER — Other Ambulatory Visit: Payer: Self-pay

## 2019-07-16 ENCOUNTER — Ambulatory Visit
Admission: RE | Admit: 2019-07-16 | Discharge: 2019-07-16 | Disposition: A | Discharge status: Home / Self Care | Payer: Medicare Other | Source: Ambulatory Visit | Attending: Oncology | Admitting: Oncology

## 2019-07-16 DIAGNOSIS — Z170 Estrogen receptor positive status [ER+]: Secondary | ICD-10-CM | POA: Diagnosis not present

## 2019-07-16 DIAGNOSIS — C50912 Malignant neoplasm of unspecified site of left female breast: Secondary | ICD-10-CM | POA: Diagnosis not present

## 2019-07-16 DIAGNOSIS — M4854XA Collapsed vertebra, not elsewhere classified, thoracic region, initial encounter for fracture: Secondary | ICD-10-CM | POA: Diagnosis not present

## 2019-07-16 DIAGNOSIS — K7689 Other specified diseases of liver: Secondary | ICD-10-CM | POA: Diagnosis not present

## 2019-07-16 DIAGNOSIS — I700 Atherosclerosis of aorta: Secondary | ICD-10-CM | POA: Diagnosis not present

## 2019-07-16 DIAGNOSIS — M4856XA Collapsed vertebra, not elsewhere classified, lumbar region, initial encounter for fracture: Secondary | ICD-10-CM | POA: Diagnosis not present

## 2019-07-16 LAB — COMPREHENSIVE METABOLIC PANEL
ALT: 24 U/L (ref 0–44)
AST: 30 U/L (ref 15–41)
Albumin: 3.6 g/dL (ref 3.5–5.0)
Alkaline Phosphatase: 72 U/L (ref 38–126)
Anion gap: 15 (ref 5–15)
BUN: 22 mg/dL (ref 8–23)
CO2: 25 mmol/L (ref 22–32)
Calcium: 8.6 mg/dL — ABNORMAL LOW (ref 8.9–10.3)
Chloride: 99 mmol/L (ref 98–111)
Creatinine, Ser: 0.78 mg/dL (ref 0.44–1.00)
GFR calc Af Amer: >60 mL/min (ref >60)
GFR calc non Af Amer: >60 mL/min (ref >60)
Glucose, Bld: 160 mg/dL — ABNORMAL HIGH (ref 70–99)
Potassium: 3.6 mmol/L (ref 3.5–5.1)
Sodium: 139 mmol/L (ref 135–145)
Total Bilirubin: 0.7 mg/dL (ref 0.3–1.2)
Total Protein: 7.2 g/dL (ref 6.5–8.1)

## 2019-07-16 LAB — CBC WITH DIFFERENTIAL/PLATELET
Abs Immature Granulocytes: 0.03 10*3/uL (ref 0.00–0.07)
Basophils Absolute: 0.1 10*3/uL (ref 0.0–0.1)
Basophils Relative: 1 %
Eosinophils Absolute: 0 10*3/uL (ref 0.0–0.5)
Eosinophils Relative: 0 %
HCT: 38.2 % (ref 36.0–46.0)
Hemoglobin: 13 g/dL (ref 12.0–15.0)
Immature Granulocytes: 1 %
Lymphocytes Relative: 8 %
Lymphs Abs: 0.4 10*3/uL — ABNORMAL LOW (ref 0.7–4.0)
MCH: 37.9 pg — ABNORMAL HIGH (ref 26.0–34.0)
MCHC: 34 g/dL (ref 30.0–36.0)
MCV: 111.4 fL — ABNORMAL HIGH (ref 80.0–100.0)
Monocytes Absolute: 0.3 10*3/uL (ref 0.1–1.0)
Monocytes Relative: 6 %
Neutro Abs: 4.6 10*3/uL (ref 1.7–7.7)
Neutrophils Relative %: 84 %
Platelets: 231 10*3/uL (ref 150–400)
RBC: 3.43 MIL/uL — ABNORMAL LOW (ref 3.87–5.11)
RDW: 14.4 % (ref 11.5–15.5)
WBC: 5.5 10*3/uL (ref 4.0–10.5)
nRBC: 0 % (ref 0.0–0.2)

## 2019-07-16 LAB — POCT I-STAT CREATININE: Creatinine, Ser: 0.7 mg/dL (ref 0.44–1.00)

## 2019-07-16 MED ORDER — IOHEXOL 300 MG/ML  SOLN
80.0000 mL | Freq: Once | INTRAMUSCULAR | Status: AC | PRN
  Administered 2019-07-16: 75 mL via INTRAVENOUS

## 2019-07-17 ENCOUNTER — Other Ambulatory Visit: Payer: Medicare Other

## 2019-07-17 LAB — CANCER ANTIGEN 15-3: CA 15-3: 27.6 U/mL — ABNORMAL HIGH (ref 0.0–25.0)

## 2019-07-18 ENCOUNTER — Inpatient Hospital Stay (HOSPITAL_BASED_OUTPATIENT_CLINIC_OR_DEPARTMENT_OTHER): Payer: Medicare Other | Admitting: Oncology

## 2019-07-18 DIAGNOSIS — C50912 Malignant neoplasm of unspecified site of left female breast: Secondary | ICD-10-CM

## 2019-07-18 DIAGNOSIS — Z79899 Other long term (current) drug therapy: Secondary | ICD-10-CM | POA: Diagnosis not present

## 2019-07-18 DIAGNOSIS — Z170 Estrogen receptor positive status [ER+]: Secondary | ICD-10-CM

## 2019-07-18 DIAGNOSIS — Z79810 Long term (current) use of selective estrogen receptor modulators (SERMs): Secondary | ICD-10-CM

## 2019-07-19 NOTE — Progress Notes (Signed)
I connected with Kathy Mckay on 07/19/19 at  2:45 PM EDT by video enabled telemedicine visit and verified that I am speaking with the correct person using two identifiers.   I discussed the limitations, risks, security and privacy concerns of performing an evaluation and management service by telemedicine and the availability of in-person appointments. I also discussed with the patient that there may be a patient responsible charge related to this service. The patient expressed understanding and agreed to proceed.  Other persons participating in the visit and their role in the encounter: Patient's caregiver at nursing home Ms. Tyree  Patient's location: Nursing home Provider's location:  work  Risk analyst Complaint: Routine follow-up of breast cancer  History of present illness: patient is a 86 year old female with developmental disorder. She is a ward of the state and lives in a group home. She is here with her caregiver Ms. Tyree. She has been at the group home for the last 4 years. She does not have any living family other than a distant cousin. Her assigned social worker Ms. Glennon Mac is her healthcare proxy. Her past medical history is significant for hypertension hyperlipidemia and osteoporosis among other medical problems. Her caregiver states that overall this patient is a happy-go-lucky person and has been doing well at her nursing home. She has not had any recent falls or hospitalizations. She recently underwent a screening mammogram which picked up a mass in her left breast. Patient has not had a prior mammogram before this for years.  Mammogram and ultrasound showed 2.9 x 2.8 x 3 cm mass in the left retroareolar region. There is extension into the skin and nipple areole or complex. Associated skin thickening. At least 2 abnormal appearing left axillary lymph nodes with cortical thickening up to 1 cm.  Core biopsy of the breast mass and the axillary lymph node revealed invasive mammary carcinoma,  grade 2 ER Greater than 90% and PR 11-50% positive and HER-2/neu negative. Extracapsular extension was noted in the lymph node biopsy.   CT chest abdomen and pelvis showed left breast mass, suspicious left axillary and internal mammary adenopathy along with bilateral pulmonary nodules measuring up to 16 mm in diameter consistent with metastatic disease. No evidence of bone metastases on bone scan  Patient was started on ibrance 75 mg and has been on it since August 2019.  She is also taking tamoxifen due to baseline osteoporosis   Interval history: Patient has developmental disorder and history obtained with the help of her caregiver Coralyn Mark at nursing home.  She reports that overall patient has declined in the last 1 to 2 months.  She spends most of her time in bed and only comes to eat her food.  She has to be coaxed to eat her food otherwise her oral intake has not been great.  She has not weighed her recently.  Left breast mass has been more or less constant per caregiver with occasional discharge   Review of Systems  Unable to perform ROS: Dementia    No Known Allergies  Past Medical History:  Diagnosis Date   Breast cancer ([place])    Cancer ([place])    Developmental delay    GERD (gastroesophageal reflux disease)    Hyperlipidemia    Hypertension    Osteoporosis     Past Surgical History:  Procedure Laterality Date   ABDOMINAL HYSTERECTOMY     BREAST BIOPSY Left 07/19/2017   invasive mammary carcinoma   BREAST BIOPSY Left 07/19/2017   invasive mammary carcinoma  SHOULDER SURGERY      Social History   Socioeconomic History   Marital status: Single    Spouse name: Not on file   Number of children: Not on file   Years of education: Not on file   Highest education level: Not on file  Occupational History   Not on file  Tobacco Use   Smoking status: Never Smoker   Smokeless tobacco: Never Used  Vaping Use   Vaping Use: Never used  Substance and  Sexual Activity   Alcohol use: No   Drug use: No   Sexual activity: Never  Other Topics Concern   Not on file  Social History Narrative   Lives at a family care home. No living relatives. Guardian of the state.      Lives [place] [facility] for past 6 years.    Social Determinants of Health   Financial Resource Strain:    Difficulty of Paying Living Expenses:   Food Insecurity:    Worried About Charity fundraiser in the Last Year:    Arboriculturist in the Last Year:   Transportation Needs:    Film/video editor (Medical):    Lack of Transportation (Non-Medical):   Physical Activity:    Days of Exercise per Week:    Minutes of Exercise per Session:   Stress:    Feeling of Stress :   Social Connections:    Frequency of Communication with Friends and Family:    Frequency of Social Gatherings with Friends and Family:    Attends Religious Services:    Active Member of Clubs or Organizations:    Attends Music therapist:    Marital Status:   Intimate Partner Violence:    Fear of Current or Ex-Partner:    Emotionally Abused:    Physically Abused:    Sexually Abused:     Family History  Problem Relation Age of Onset   Diabetes Mother    Diabetes Father    Cancer Sister      Current Outpatient Medications:    amLODipine (NORVASC) 10 MG tablet, TAKE 1 TABLET BY MOUTH DAILY, Disp: 90 tablet, Rfl: 1   atorvastatin (LIPITOR) 40 MG tablet, TAKE 1 TABLET BY MOUTH AT BEDTIME FOR CHOLESTEROL, Disp: 90 tablet, Rfl: 1   IBRANCE 75 MG tablet, TAKE 1 TABLET (75 MG TOTAL) BY MOUTH DAILY. TAKE FOR 21 DAYS ON, 7 DAYS OFF, REPEAT EVERY 28 DAYS., Disp: 21 tablet, Rfl: 1   Menthol, Topical Analgesic, (BIOFREEZE) 4 % GEL, 1 application to painful area every 4 hours as needed, Disp: 89 mL, Rfl: 2   mirtazapine (REMERON) 45 MG tablet, TAKE 1 TABLET BY MOUTH AT BEDTIME, Disp: 90 tablet, Rfl: 0   oxyCODONE-acetaminophen (PERCOCET/ROXICET) 5-325 MG  tablet, Take 1 tablet by mouth every 6 (six) hours as needed for severe pain., Disp: 120 tablet, Rfl: 0   polyethylene glycol powder (GLYCOLAX/MIRALAX) 17 GM/SCOOP powder, Take 17 g by mouth daily as needed for mild constipation., Disp: 500 g, Rfl: 0   prochlorperazine (COMPAZINE) 10 MG tablet, TAKE 1 TABLET BY MOUTH EVERY 8 HOURS AS NEEDED FOR NAUSEA OR VOMITING., Disp: 30 tablet, Rfl: 0   esomeprazole (NEXIUM) 40 MG capsule, TAKE 1 CAPSULE BY MOUTH EVERY DAY AT 8:00 AM, Disp: 30 capsule, Rfl: 0   tamoxifen (NOLVADEX) 20 MG tablet, TAKE 1 TABLET BY MOUTH ONCE DAILY, Disp: 30 tablet, Rfl: 3  CT Chest W Contrast  Result Date: 07/16/2019 CLINICAL DATA:  Metastatic left breast cancer.  Restaging. EXAM: CT CHEST, ABDOMEN, AND PELVIS WITH CONTRAST TECHNIQUE: Multidetector CT imaging of the chest, abdomen and pelvis was performed following the standard protocol during bolus administration of intravenous contrast. CONTRAST:  23m OMNIPAQUE IOHEXOL 300 MG/ML  SOLN COMPARISON:  03/21/2018 CT chest, abdomen and pelvis. FINDINGS: CT CHEST FINDINGS Motion degraded exam, limiting assessment. Cardiovascular: Top-normal heart size. No significant pericardial effusion/thickening. Atherosclerotic nonaneurysmal thoracic aorta. Normal caliber pulmonary arteries. No central pulmonary emboli. Mediastinum/Nodes: Subcentimeter hypodense left thyroid nodule, stable. Not clinically significant; no follow-up imaging recommended (ref: J Am Coll Radiol. 2015 Feb;12(2): 143-50). Unremarkable esophagus. No pathologically enlarged axillary, mediastinal or hilar lymph nodes. Lungs/Pleura: No pneumothorax. No pleural effusion. Medial right lower lobe 0.3 cm solid pulmonary nodule (series 2/image 46), previously 0.3 cm, stable. No new significant pulmonary nodules. No acute consolidative airspace disease. Stable thick parenchymal bands in the lingula and left lower lobe. Musculoskeletal: No aggressive appearing focal osseous lesions.  Chronic severe T12 and moderate T4 vertebral compression fractures. Exaggerated thoracic kyphosis appears similar. Retroareolar left breast 1.9 x 1.5 cm mass (series 3/image 44), previously 2.1 x 1.5 cm, not substantially changed. CT ABDOMEN PELVIS FINDINGS Hepatobiliary: Normal liver size. New hypodense 0.9 cm inferior right liver mass (series 3/image 40). New hypodense 0.7 cm segment 4A left liver mass (series 3/image 43). No additional liver masses. Normal gallbladder with no radiopaque cholelithiasis. No biliary ductal dilatation. Pancreas: Normal, with no mass or duct dilation. Spleen: Normal size. No mass. Adrenals/Urinary Tract: Normal adrenals. Stable asymmetric moderate scattered left renal cortical scarring. Scattered subcentimeter hypodense left renal cortical lesions are too small to characterize and are not definitely changed. No hydronephrosis. Stable small left obturator hernia containing a small portion of the left bladder (series 3/image 93). No acute bladder abnormality. Stomach/Bowel: Normal non-distended stomach. Normal caliber small bowel with no small bowel wall thickening. Appendix not discretely visualized. Moderate sigmoid diverticulosis with no large bowel wall thickening or significant pericolonic fat stranding. Vascular/Lymphatic: Atherosclerotic nonaneurysmal abdominal aorta. Patent portal, splenic, hepatic and renal veins. No pathologically enlarged lymph nodes in the abdomen or pelvis. Reproductive: No adnexal masses. Stable diminutive appearing uterus versus cervical remnant. Patient is reportedly post hysterectomy per epic. Other: No pneumoperitoneum, ascites or focal fluid collection. Musculoskeletal: No aggressive appearing focal osseous lesions. New mild inferior L1 vertebral compression fracture. IMPRESSION: 1. Two new subcentimeter hypodense liver lesions, suspicious for liver metastases. Recommend attention on follow-up CT abdomen with IV contrast in 3-6 months. Patient is not  a good candidate for MRI given motion degradation on this CT study. 2. No new or progressive metastatic disease in the chest. Tiny right lower lobe pulmonary nodule is stable. Left retroareolar breast mass is stable. 3. New mild inferior L1 vertebral compression fracture of uncertain chronicity, possibly acute. Chronic severe T12 and moderate T4 vertebral compression fractures. 4. Aortic Atherosclerosis (ICD10-I70.0). Electronically Signed   By: JIlona SorrelM.D.   On: 07/16/2019 09:57   CT ABDOMEN PELVIS W CONTRAST  Result Date: 07/16/2019 CLINICAL DATA:  Metastatic left breast cancer.  Restaging. EXAM: CT CHEST, ABDOMEN, AND PELVIS WITH CONTRAST TECHNIQUE: Multidetector CT imaging of the chest, abdomen and pelvis was performed following the standard protocol during bolus administration of intravenous contrast. CONTRAST:  784mOMNIPAQUE IOHEXOL 300 MG/ML  SOLN COMPARISON:  03/21/2018 CT chest, abdomen and pelvis. FINDINGS: CT CHEST FINDINGS Motion degraded exam, limiting assessment. Cardiovascular: Top-normal heart size. No significant pericardial effusion/thickening. Atherosclerotic nonaneurysmal thoracic aorta. Normal caliber pulmonary arteries. No central  pulmonary emboli. Mediastinum/Nodes: Subcentimeter hypodense left thyroid nodule, stable. Not clinically significant; no follow-up imaging recommended (ref: J Am Coll Radiol. 2015 Feb;12(2): 143-50). Unremarkable esophagus. No pathologically enlarged axillary, mediastinal or hilar lymph nodes. Lungs/Pleura: No pneumothorax. No pleural effusion. Medial right lower lobe 0.3 cm solid pulmonary nodule (series 2/image 46), previously 0.3 cm, stable. No new significant pulmonary nodules. No acute consolidative airspace disease. Stable thick parenchymal bands in the lingula and left lower lobe. Musculoskeletal: No aggressive appearing focal osseous lesions. Chronic severe T12 and moderate T4 vertebral compression fractures. Exaggerated thoracic kyphosis appears  similar. Retroareolar left breast 1.9 x 1.5 cm mass (series 3/image 44), previously 2.1 x 1.5 cm, not substantially changed. CT ABDOMEN PELVIS FINDINGS Hepatobiliary: Normal liver size. New hypodense 0.9 cm inferior right liver mass (series 3/image 40). New hypodense 0.7 cm segment 4A left liver mass (series 3/image 43). No additional liver masses. Normal gallbladder with no radiopaque cholelithiasis. No biliary ductal dilatation. Pancreas: Normal, with no mass or duct dilation. Spleen: Normal size. No mass. Adrenals/Urinary Tract: Normal adrenals. Stable asymmetric moderate scattered left renal cortical scarring. Scattered subcentimeter hypodense left renal cortical lesions are too small to characterize and are not definitely changed. No hydronephrosis. Stable small left obturator hernia containing a small portion of the left bladder (series 3/image 93). No acute bladder abnormality. Stomach/Bowel: Normal non-distended stomach. Normal caliber small bowel with no small bowel wall thickening. Appendix not discretely visualized. Moderate sigmoid diverticulosis with no large bowel wall thickening or significant pericolonic fat stranding. Vascular/Lymphatic: Atherosclerotic nonaneurysmal abdominal aorta. Patent portal, splenic, hepatic and renal veins. No pathologically enlarged lymph nodes in the abdomen or pelvis. Reproductive: No adnexal masses. Stable diminutive appearing uterus versus cervical remnant. Patient is reportedly post hysterectomy per epic. Other: No pneumoperitoneum, ascites or focal fluid collection. Musculoskeletal: No aggressive appearing focal osseous lesions. New mild inferior L1 vertebral compression fracture. IMPRESSION: 1. Two new subcentimeter hypodense liver lesions, suspicious for liver metastases. Recommend attention on follow-up CT abdomen with IV contrast in 3-6 months. Patient is not a good candidate for MRI given motion degradation on this CT study. 2. No new or progressive metastatic  disease in the chest. Tiny right lower lobe pulmonary nodule is stable. Left retroareolar breast mass is stable. 3. New mild inferior L1 vertebral compression fracture of uncertain chronicity, possibly acute. Chronic severe T12 and moderate T4 vertebral compression fractures. 4. Aortic Atherosclerosis (ICD10-I70.0). Electronically Signed   By: Ilona Sorrel M.D.   On: 07/16/2019 09:57    No images are attached to the encounter.   CMP Latest Ref Rng & Units 07/16/2019  Glucose 70 - 99 mg/dL 160(H)  BUN 8 - 23 mg/dL 22  Creatinine 0.44 - 1.00 mg/dL 0.78  Sodium 135 - 145 mmol/L 139  Potassium 3.5 - 5.1 mmol/L 3.6  Chloride 98 - 111 mmol/L 99  CO2 22 - 32 mmol/L 25  Calcium 8.9 - 10.3 mg/dL 8.6(L)  Total Protein 6.5 - 8.1 g/dL 7.2  Total Bilirubin 0.3 - 1.2 mg/dL 0.7  Alkaline Phos 38 - 126 U/L 72  AST 15 - 41 U/L 30  ALT 0 - 44 U/L 24   CBC Latest Ref Rng & Units 07/16/2019  WBC 4.0 - 10.5 K/uL 5.5  Hemoglobin 12.0 - 15.0 g/dL 13.0  Hematocrit 36 - 46 % 38.2  Platelets 150 - 400 K/uL 231    Assessment and plan: Patient is a 86 year old female with metastatic ER positive breast cancer with lung metastases on tamoxifen and Ibrance.  This is a routine follow-up visit to discuss CT scan results  I discussed CT chest abdomen pelvis findings with the patient's caregiver Tyree.  Overall lung metastases is more or less stable.  However patient noted to have 2 newSubcentimeter liver lesions concerning for liver metastases.  Size of the left breast mass has more or less remained stable.  Tumor markers CA 15-3 is currentlyAround 25-30.  Values have remained stable.  I am inclined to continue tamoxifen and Ibrance at this time.  I also explained to the patient's caregiver that if patient continues to decline and is not taking much oral intake and unable to swallow pills it would be reasonable to consider hospice at that time.  Patient's caregiver feels that she is not there at that point yet but will let  us know if her condition declines  Follow-up instructions: CBC with differential, CMP and CA 15-3 in 3 months in 5 months and I will see her back in 5 months  I discussed the assessment and treatment plan with the patient. The patient was provided an opportunity to ask questions and all were answered. The patient agreed with the plan and demonstrated an understanding of the instructions.   The patient was advised to call back or seek an in-person evaluation if the symptoms worsen or if the condition fails to improve as anticipated.  I  Visit Diagnosis: 1. Malignant neoplasm of left breast in female, estrogen receptor positive, unspecified site of breast ([place])   2. Long-term current use of tamoxifen     Dr. Randa Evens, MD, MPH [facility] at [facility] Tel- [phone] 07/19/2019 12:42 PM

## 2019-07-26 ENCOUNTER — Other Ambulatory Visit: Payer: Self-pay | Admitting: Oncology

## 2019-07-26 DIAGNOSIS — C50912 Malignant neoplasm of unspecified site of left female breast: Secondary | ICD-10-CM

## 2019-07-31 ENCOUNTER — Other Ambulatory Visit: Payer: Self-pay | Admitting: Family

## 2019-08-01 MED FILL — IBRANCE 75 MG TABS: 75 | 28 days supply | Qty: 21 | Fill #0

## 2019-08-29 ENCOUNTER — Other Ambulatory Visit: Payer: Self-pay | Admitting: Family

## 2019-08-29 MED FILL — IBRANCE 75 MG TABS: 75 | 28 days supply | Qty: 21 | Fill #1

## 2019-09-06 ENCOUNTER — Telehealth: Payer: Self-pay

## 2019-09-06 NOTE — Telephone Encounter (Signed)
Left voicemail with [name] to contact our office and schedule an appointment for [facility] as she has not been seen in over a year.

## 2019-09-24 ENCOUNTER — Other Ambulatory Visit: Payer: Self-pay | Admitting: Oncology

## 2019-09-24 DIAGNOSIS — Z170 Estrogen receptor positive status [ER+]: Secondary | ICD-10-CM

## 2019-09-26 MED FILL — IBRANCE 75 MG TABS: 75 | 28 days supply | Qty: 21 | Fill #0

## 2019-09-27 ENCOUNTER — Other Ambulatory Visit: Payer: Self-pay | Admitting: Oncology

## 2019-09-27 ENCOUNTER — Other Ambulatory Visit: Payer: Self-pay | Admitting: Family

## 2019-10-18 ENCOUNTER — Other Ambulatory Visit: Payer: Self-pay

## 2019-10-18 ENCOUNTER — Inpatient Hospital Stay: Payer: Medicare Other

## 2019-10-18 DIAGNOSIS — R059 Cough, unspecified: Secondary | ICD-10-CM | POA: Diagnosis not present

## 2019-10-18 DIAGNOSIS — Z170 Estrogen receptor positive status [ER+]: Secondary | ICD-10-CM | POA: Insufficient documentation

## 2019-10-18 DIAGNOSIS — K219 Gastro-esophageal reflux disease without esophagitis: Secondary | ICD-10-CM | POA: Diagnosis not present

## 2019-10-18 DIAGNOSIS — Y95 Nosocomial condition: Secondary | ICD-10-CM | POA: Diagnosis present

## 2019-10-18 DIAGNOSIS — J9601 Acute respiratory failure with hypoxia: Secondary | ICD-10-CM | POA: Diagnosis not present

## 2019-10-18 DIAGNOSIS — K5730 Diverticulosis of large intestine without perforation or abscess without bleeding: Secondary | ICD-10-CM | POA: Diagnosis not present

## 2019-10-18 DIAGNOSIS — A419 Sepsis, unspecified organism: Secondary | ICD-10-CM | POA: Diagnosis not present

## 2019-10-18 DIAGNOSIS — C50912 Malignant neoplasm of unspecified site of left female breast: Secondary | ICD-10-CM

## 2019-10-18 DIAGNOSIS — R0902 Hypoxemia: Secondary | ICD-10-CM | POA: Diagnosis not present

## 2019-10-18 DIAGNOSIS — I700 Atherosclerosis of aorta: Secondary | ICD-10-CM | POA: Diagnosis not present

## 2019-10-18 DIAGNOSIS — K7689 Other specified diseases of liver: Secondary | ICD-10-CM | POA: Diagnosis not present

## 2019-10-18 DIAGNOSIS — C787 Secondary malignant neoplasm of liver and intrahepatic bile duct: Secondary | ICD-10-CM | POA: Diagnosis not present

## 2019-10-18 DIAGNOSIS — E876 Hypokalemia: Secondary | ICD-10-CM | POA: Diagnosis not present

## 2019-10-18 DIAGNOSIS — Z681 Body mass index (BMI) 19 or less, adult: Secondary | ICD-10-CM | POA: Diagnosis not present

## 2019-10-18 DIAGNOSIS — J13 Pneumonia due to Streptococcus pneumoniae: Secondary | ICD-10-CM | POA: Diagnosis present

## 2019-10-18 DIAGNOSIS — Z79810 Long term (current) use of selective estrogen receptor modulators (SERMs): Secondary | ICD-10-CM | POA: Insufficient documentation

## 2019-10-18 DIAGNOSIS — F32A Depression, unspecified: Secondary | ICD-10-CM | POA: Diagnosis not present

## 2019-10-18 DIAGNOSIS — R0600 Dyspnea, unspecified: Secondary | ICD-10-CM | POA: Diagnosis not present

## 2019-10-18 DIAGNOSIS — Z515 Encounter for palliative care: Secondary | ICD-10-CM | POA: Diagnosis not present

## 2019-10-18 DIAGNOSIS — I10 Essential (primary) hypertension: Secondary | ICD-10-CM | POA: Diagnosis not present

## 2019-10-18 DIAGNOSIS — F329 Major depressive disorder, single episode, unspecified: Secondary | ICD-10-CM | POA: Diagnosis not present

## 2019-10-18 DIAGNOSIS — M810 Age-related osteoporosis without current pathological fracture: Secondary | ICD-10-CM | POA: Diagnosis not present

## 2019-10-18 DIAGNOSIS — J189 Pneumonia, unspecified organism: Secondary | ICD-10-CM | POA: Diagnosis not present

## 2019-10-18 DIAGNOSIS — R069 Unspecified abnormalities of breathing: Secondary | ICD-10-CM | POA: Diagnosis not present

## 2019-10-18 DIAGNOSIS — F79 Unspecified intellectual disabilities: Secondary | ICD-10-CM | POA: Diagnosis present

## 2019-10-18 DIAGNOSIS — R0602 Shortness of breath: Secondary | ICD-10-CM | POA: Diagnosis present

## 2019-10-18 DIAGNOSIS — E785 Hyperlipidemia, unspecified: Secondary | ICD-10-CM | POA: Diagnosis not present

## 2019-10-18 DIAGNOSIS — J9811 Atelectasis: Secondary | ICD-10-CM | POA: Diagnosis not present

## 2019-10-18 DIAGNOSIS — I517 Cardiomegaly: Secondary | ICD-10-CM | POA: Diagnosis not present

## 2019-10-18 DIAGNOSIS — J90 Pleural effusion, not elsewhere classified: Secondary | ICD-10-CM | POA: Diagnosis not present

## 2019-10-18 DIAGNOSIS — R6250 Unspecified lack of expected normal physiological development in childhood: Secondary | ICD-10-CM | POA: Diagnosis not present

## 2019-10-18 DIAGNOSIS — Z853 Personal history of malignant neoplasm of breast: Secondary | ICD-10-CM | POA: Diagnosis not present

## 2019-10-18 DIAGNOSIS — Z20822 Contact with and (suspected) exposure to covid-19: Secondary | ICD-10-CM | POA: Diagnosis present

## 2019-10-18 DIAGNOSIS — Z79899 Other long term (current) drug therapy: Secondary | ICD-10-CM | POA: Diagnosis not present

## 2019-10-18 DIAGNOSIS — Z7189 Other specified counseling: Secondary | ICD-10-CM | POA: Diagnosis not present

## 2019-10-18 DIAGNOSIS — A403 Sepsis due to Streptococcus pneumoniae: Secondary | ICD-10-CM | POA: Diagnosis not present

## 2019-10-18 DIAGNOSIS — Z66 Do not resuscitate: Secondary | ICD-10-CM | POA: Diagnosis not present

## 2019-10-18 DIAGNOSIS — Z833 Family history of diabetes mellitus: Secondary | ICD-10-CM | POA: Diagnosis not present

## 2019-10-18 DIAGNOSIS — R160 Hepatomegaly, not elsewhere classified: Secondary | ICD-10-CM | POA: Diagnosis not present

## 2019-10-18 DIAGNOSIS — D696 Thrombocytopenia, unspecified: Secondary | ICD-10-CM | POA: Diagnosis not present

## 2019-10-18 DIAGNOSIS — Z809 Family history of malignant neoplasm, unspecified: Secondary | ICD-10-CM | POA: Diagnosis not present

## 2019-10-18 DIAGNOSIS — E43 Unspecified severe protein-calorie malnutrition: Secondary | ICD-10-CM | POA: Diagnosis not present

## 2019-10-18 LAB — CBC WITH DIFFERENTIAL/PLATELET
Abs Immature Granulocytes: 0.01 10*3/uL (ref 0.00–0.07)
Basophils Absolute: 0 10*3/uL (ref 0.0–0.1)
Basophils Relative: 1 %
Eosinophils Absolute: 0 10*3/uL (ref 0.0–0.5)
Eosinophils Relative: 0 %
HCT: 35.8 % — ABNORMAL LOW (ref 36.0–46.0)
Hemoglobin: 12.8 g/dL (ref 12.0–15.0)
Immature Granulocytes: 0 %
Lymphocytes Relative: 5 %
Lymphs Abs: 0.2 10*3/uL — ABNORMAL LOW (ref 0.7–4.0)
MCH: 38.9 pg — ABNORMAL HIGH (ref 26.0–34.0)
MCHC: 35.8 g/dL (ref 30.0–36.0)
MCV: 108.8 fL — ABNORMAL HIGH (ref 80.0–100.0)
Monocytes Absolute: 0.2 10*3/uL (ref 0.1–1.0)
Monocytes Relative: 6 %
Neutro Abs: 3.2 10*3/uL (ref 1.7–7.7)
Neutrophils Relative %: 88 %
Platelets: 125 10*3/uL — ABNORMAL LOW (ref 150–400)
RBC: 3.29 MIL/uL — ABNORMAL LOW (ref 3.87–5.11)
RDW: 12.7 % (ref 11.5–15.5)
Smear Review: DECREASED
WBC: 3.6 10*3/uL — ABNORMAL LOW (ref 4.0–10.5)
nRBC: 0 % (ref 0.0–0.2)

## 2019-10-18 LAB — COMPREHENSIVE METABOLIC PANEL
ALT: 27 U/L (ref 0–44)
AST: 49 U/L — ABNORMAL HIGH (ref 15–41)
Albumin: 3.1 g/dL — ABNORMAL LOW (ref 3.5–5.0)
Alkaline Phosphatase: 69 U/L (ref 38–126)
Anion gap: 12 (ref 5–15)
BUN: 26 mg/dL — ABNORMAL HIGH (ref 8–23)
CO2: 23 mmol/L (ref 22–32)
Calcium: 8.3 mg/dL — ABNORMAL LOW (ref 8.9–10.3)
Chloride: 99 mmol/L (ref 98–111)
Creatinine, Ser: 0.84 mg/dL (ref 0.44–1.00)
GFR, Estimated: >60 mL/min (ref >60)
Glucose, Bld: 133 mg/dL — ABNORMAL HIGH (ref 70–99)
Potassium: 3.2 mmol/L — ABNORMAL LOW (ref 3.5–5.1)
Sodium: 134 mmol/L — ABNORMAL LOW (ref 135–145)
Total Bilirubin: 0.9 mg/dL (ref 0.3–1.2)
Total Protein: 6.9 g/dL (ref 6.5–8.1)

## 2019-10-19 ENCOUNTER — Other Ambulatory Visit: Payer: Self-pay

## 2019-10-19 ENCOUNTER — Emergency Department: Payer: Medicare Other

## 2019-10-19 ENCOUNTER — Encounter: Payer: Self-pay | Admitting: Emergency Medicine

## 2019-10-19 ENCOUNTER — Inpatient Hospital Stay
Admission: EM | Admit: 2019-10-19 | Discharge: 2019-11-07 | DRG: 871 | Disposition: A | Discharge status: Skilled Nursing Facility | Payer: Medicare Other | Source: Skilled Nursing Facility | Attending: Hospitalist | Admitting: Hospitalist

## 2019-10-19 DIAGNOSIS — Z681 Body mass index (BMI) 19 or less, adult: Secondary | ICD-10-CM

## 2019-10-19 DIAGNOSIS — F79 Unspecified intellectual disabilities: Secondary | ICD-10-CM | POA: Diagnosis present

## 2019-10-19 DIAGNOSIS — R6250 Unspecified lack of expected normal physiological development in childhood: Secondary | ICD-10-CM | POA: Diagnosis present

## 2019-10-19 DIAGNOSIS — Z66 Do not resuscitate: Secondary | ICD-10-CM | POA: Diagnosis not present

## 2019-10-19 DIAGNOSIS — E43 Unspecified severe protein-calorie malnutrition: Secondary | ICD-10-CM | POA: Diagnosis present

## 2019-10-19 DIAGNOSIS — R0602 Shortness of breath: Secondary | ICD-10-CM | POA: Diagnosis not present

## 2019-10-19 DIAGNOSIS — Z79899 Other long term (current) drug therapy: Secondary | ICD-10-CM | POA: Diagnosis not present

## 2019-10-19 DIAGNOSIS — R54 Age-related physical debility: Secondary | ICD-10-CM | POA: Diagnosis present

## 2019-10-19 DIAGNOSIS — R059 Cough, unspecified: Secondary | ICD-10-CM | POA: Diagnosis not present

## 2019-10-19 DIAGNOSIS — J189 Pneumonia, unspecified organism: Secondary | ICD-10-CM | POA: Diagnosis not present

## 2019-10-19 DIAGNOSIS — E785 Hyperlipidemia, unspecified: Secondary | ICD-10-CM | POA: Diagnosis present

## 2019-10-19 DIAGNOSIS — I10 Essential (primary) hypertension: Secondary | ICD-10-CM | POA: Diagnosis not present

## 2019-10-19 DIAGNOSIS — E876 Hypokalemia: Secondary | ICD-10-CM | POA: Diagnosis not present

## 2019-10-19 DIAGNOSIS — C50912 Malignant neoplasm of unspecified site of left female breast: Secondary | ICD-10-CM

## 2019-10-19 DIAGNOSIS — I517 Cardiomegaly: Secondary | ICD-10-CM | POA: Diagnosis not present

## 2019-10-19 DIAGNOSIS — J13 Pneumonia due to Streptococcus pneumoniae: Secondary | ICD-10-CM | POA: Diagnosis present

## 2019-10-19 DIAGNOSIS — Z09 Encounter for follow-up examination after completed treatment for conditions other than malignant neoplasm: Secondary | ICD-10-CM

## 2019-10-19 DIAGNOSIS — Z79810 Long term (current) use of selective estrogen receptor modulators (SERMs): Secondary | ICD-10-CM | POA: Diagnosis not present

## 2019-10-19 DIAGNOSIS — J9811 Atelectasis: Secondary | ICD-10-CM | POA: Diagnosis not present

## 2019-10-19 DIAGNOSIS — C787 Secondary malignant neoplasm of liver and intrahepatic bile duct: Secondary | ICD-10-CM | POA: Diagnosis present

## 2019-10-19 DIAGNOSIS — R0902 Hypoxemia: Secondary | ICD-10-CM

## 2019-10-19 DIAGNOSIS — F329 Major depressive disorder, single episode, unspecified: Secondary | ICD-10-CM | POA: Diagnosis not present

## 2019-10-19 DIAGNOSIS — R1310 Dysphagia, unspecified: Secondary | ICD-10-CM | POA: Diagnosis present

## 2019-10-19 DIAGNOSIS — Y95 Nosocomial condition: Secondary | ICD-10-CM | POA: Diagnosis present

## 2019-10-19 DIAGNOSIS — K219 Gastro-esophageal reflux disease without esophagitis: Secondary | ICD-10-CM | POA: Diagnosis not present

## 2019-10-19 DIAGNOSIS — Z7189 Other specified counseling: Secondary | ICD-10-CM | POA: Diagnosis not present

## 2019-10-19 DIAGNOSIS — M810 Age-related osteoporosis without current pathological fracture: Secondary | ICD-10-CM | POA: Diagnosis present

## 2019-10-19 DIAGNOSIS — F32A Depression, unspecified: Secondary | ICD-10-CM | POA: Diagnosis present

## 2019-10-19 DIAGNOSIS — K7689 Other specified diseases of liver: Secondary | ICD-10-CM | POA: Diagnosis not present

## 2019-10-19 DIAGNOSIS — D510 Vitamin B12 deficiency anemia due to intrinsic factor deficiency: Secondary | ICD-10-CM | POA: Diagnosis present

## 2019-10-19 DIAGNOSIS — Z833 Family history of diabetes mellitus: Secondary | ICD-10-CM | POA: Diagnosis not present

## 2019-10-19 DIAGNOSIS — K5730 Diverticulosis of large intestine without perforation or abscess without bleeding: Secondary | ICD-10-CM | POA: Diagnosis not present

## 2019-10-19 DIAGNOSIS — J9601 Acute respiratory failure with hypoxia: Secondary | ICD-10-CM | POA: Diagnosis present

## 2019-10-19 DIAGNOSIS — Z170 Estrogen receptor positive status [ER+]: Secondary | ICD-10-CM | POA: Diagnosis not present

## 2019-10-19 DIAGNOSIS — A419 Sepsis, unspecified organism: Secondary | ICD-10-CM | POA: Diagnosis present

## 2019-10-19 DIAGNOSIS — A403 Sepsis due to Streptococcus pneumoniae: Principal | ICD-10-CM | POA: Diagnosis present

## 2019-10-19 DIAGNOSIS — Z809 Family history of malignant neoplasm, unspecified: Secondary | ICD-10-CM

## 2019-10-19 DIAGNOSIS — Z853 Personal history of malignant neoplasm of breast: Secondary | ICD-10-CM

## 2019-10-19 DIAGNOSIS — Z515 Encounter for palliative care: Secondary | ICD-10-CM

## 2019-10-19 DIAGNOSIS — Z20822 Contact with and (suspected) exposure to covid-19: Secondary | ICD-10-CM | POA: Diagnosis present

## 2019-10-19 DIAGNOSIS — R160 Hepatomegaly, not elsewhere classified: Secondary | ICD-10-CM | POA: Diagnosis not present

## 2019-10-19 DIAGNOSIS — R069 Unspecified abnormalities of breathing: Secondary | ICD-10-CM | POA: Diagnosis not present

## 2019-10-19 DIAGNOSIS — R627 Adult failure to thrive: Secondary | ICD-10-CM | POA: Diagnosis present

## 2019-10-19 DIAGNOSIS — D696 Thrombocytopenia, unspecified: Secondary | ICD-10-CM | POA: Diagnosis present

## 2019-10-19 DIAGNOSIS — F419 Anxiety disorder, unspecified: Secondary | ICD-10-CM | POA: Diagnosis not present

## 2019-10-19 DIAGNOSIS — R0600 Dyspnea, unspecified: Secondary | ICD-10-CM | POA: Diagnosis not present

## 2019-10-19 DIAGNOSIS — J90 Pleural effusion, not elsewhere classified: Secondary | ICD-10-CM | POA: Diagnosis not present

## 2019-10-19 DIAGNOSIS — I700 Atherosclerosis of aorta: Secondary | ICD-10-CM | POA: Diagnosis not present

## 2019-10-19 LAB — URINALYSIS, COMPLETE (UACMP) WITH MICROSCOPIC
Bilirubin Urine: NEGATIVE
Glucose, UA: NEGATIVE mg/dL
Ketones, ur: NEGATIVE mg/dL
Nitrite: NEGATIVE
Protein, ur: 30 mg/dL — AB
Specific Gravity, Urine: 1.011 (ref 1.005–1.030)
pH: 6 (ref 5.0–8.0)

## 2019-10-19 LAB — CBC WITH DIFFERENTIAL/PLATELET
Abs Immature Granulocytes: 0.02 10*3/uL (ref 0.00–0.07)
Basophils Absolute: 0 10*3/uL (ref 0.0–0.1)
Basophils Relative: 0 %
Eosinophils Absolute: 0 10*3/uL (ref 0.0–0.5)
Eosinophils Relative: 0 %
HCT: 36.9 % (ref 36.0–46.0)
Hemoglobin: 12.8 g/dL (ref 12.0–15.0)
Immature Granulocytes: 1 %
Lymphocytes Relative: 10 %
Lymphs Abs: 0.3 10*3/uL — ABNORMAL LOW (ref 0.7–4.0)
MCH: 38.6 pg — ABNORMAL HIGH (ref 26.0–34.0)
MCHC: 34.7 g/dL (ref 30.0–36.0)
MCV: 111.1 fL — ABNORMAL HIGH (ref 80.0–100.0)
Monocytes Absolute: 0.2 10*3/uL (ref 0.1–1.0)
Monocytes Relative: 7 %
Neutro Abs: 2.2 10*3/uL (ref 1.7–7.7)
Neutrophils Relative %: 82 %
Platelets: 102 10*3/uL — ABNORMAL LOW (ref 150–400)
RBC: 3.32 MIL/uL — ABNORMAL LOW (ref 3.87–5.11)
RDW: 12.9 % (ref 11.5–15.5)
Smear Review: NORMAL
WBC: 2.6 10*3/uL — ABNORMAL LOW (ref 4.0–10.5)
nRBC: 0 % (ref 0.0–0.2)

## 2019-10-19 LAB — COMPREHENSIVE METABOLIC PANEL
ALT: 25 U/L (ref 0–44)
AST: 43 U/L — ABNORMAL HIGH (ref 15–41)
Albumin: 3.2 g/dL — ABNORMAL LOW (ref 3.5–5.0)
Alkaline Phosphatase: 69 U/L (ref 38–126)
Anion gap: 10 (ref 5–15)
BUN: 22 mg/dL (ref 8–23)
CO2: 26 mmol/L (ref 22–32)
Calcium: 8.9 mg/dL (ref 8.9–10.3)
Chloride: 100 mmol/L (ref 98–111)
Creatinine, Ser: 0.77 mg/dL (ref 0.44–1.00)
GFR, Estimated: >60 mL/min (ref >60)
Glucose, Bld: 94 mg/dL (ref 70–99)
Potassium: 3.1 mmol/L — ABNORMAL LOW (ref 3.5–5.1)
Sodium: 136 mmol/L (ref 135–145)
Total Bilirubin: 0.9 mg/dL (ref 0.3–1.2)
Total Protein: 7.1 g/dL (ref 6.5–8.1)

## 2019-10-19 LAB — PROCALCITONIN: Procalcitonin: 0.3 ng/mL

## 2019-10-19 LAB — MAGNESIUM: Magnesium: 2.2 mg/dL (ref 1.7–2.4)

## 2019-10-19 LAB — APTT: aPTT: 30 seconds (ref 24–36)

## 2019-10-19 LAB — BRAIN NATRIURETIC PEPTIDE: B Natriuretic Peptide: 204.4 pg/mL — ABNORMAL HIGH (ref 0.0–100.0)

## 2019-10-19 LAB — LACTIC ACID, PLASMA: Lactic Acid, Venous: 1 mmol/L (ref 0.5–1.9)

## 2019-10-19 LAB — RESPIRATORY PANEL BY RT PCR (FLU A&B, COVID)
Influenza A by PCR: NEGATIVE
Influenza B by PCR: NEGATIVE
SARS Coronavirus 2 by RT PCR: NEGATIVE

## 2019-10-19 LAB — PROTIME-INR
INR: 0.9 (ref 0.8–1.2)
Prothrombin Time: 12.1 seconds (ref 11.4–15.2)

## 2019-10-19 LAB — CANCER ANTIGEN 15-3: CA 15-3: 31.3 U/mL — ABNORMAL HIGH (ref 0.0–25.0)

## 2019-10-19 MED ORDER — ALBUTEROL SULFATE (2.5 MG/3ML) 0.083% IN NEBU: 2.5000 mg | INHALATION_SOLUTION | RESPIRATORY_TRACT | Status: DC | PRN

## 2019-10-19 MED ORDER — POTASSIUM CHLORIDE CRYS ER 20 MEQ PO TBCR
40.0000 meq | EXTENDED_RELEASE_TABLET | Freq: Once | ORAL | Status: AC
  Administered 2019-10-19: 40 meq via ORAL
  Filled 2019-10-19: qty 2

## 2019-10-19 MED ORDER — SODIUM CHLORIDE 0.9 % IV SOLN
2.0000 g | Freq: Once | INTRAVENOUS | Status: AC
  Administered 2019-10-19: 2 g via INTRAVENOUS
  Filled 2019-10-19: qty 2

## 2019-10-19 MED ORDER — DM-GUAIFENESIN ER 30-600 MG PO TB12
1.0000 | ORAL_TABLET | Freq: Two times a day (BID) | ORAL | Status: DC | PRN
  Administered 2019-10-20: 1 via ORAL
  Filled 2019-10-19 (×2): qty 1

## 2019-10-19 MED ORDER — VANCOMYCIN HCL 500 MG/100ML IV SOLN
500.0000 mg | INTRAVENOUS | Status: DC
  Administered 2019-10-20: 500 mg via INTRAVENOUS
  Filled 2019-10-19 (×2): qty 100

## 2019-10-19 MED ORDER — VANCOMYCIN HCL IN DEXTROSE 1-5 GM/200ML-% IV SOLN
1000.0000 mg | Freq: Once | INTRAVENOUS | Status: AC
  Administered 2019-10-19: 1000 mg via INTRAVENOUS
  Filled 2019-10-19: qty 200

## 2019-10-19 MED ORDER — LACTATED RINGERS IV SOLN: INTRAVENOUS | Status: AC

## 2019-10-19 MED ORDER — ACETAMINOPHEN 325 MG PO TABS: 650.0000 mg | ORAL_TABLET | Freq: Four times a day (QID) | ORAL | Status: DC | PRN

## 2019-10-19 MED ORDER — SODIUM CHLORIDE 0.9 % IV SOLN
2.0000 g | Freq: Two times a day (BID) | INTRAVENOUS | Status: DC
  Administered 2019-10-19 – 2019-10-21 (×4): 2 g via INTRAVENOUS
  Filled 2019-10-19 (×6): qty 2

## 2019-10-19 MED ORDER — HYDRALAZINE HCL 20 MG/ML IJ SOLN: 5.0000 mg | INTRAMUSCULAR | Status: DC | PRN

## 2019-10-19 MED ORDER — ALBUTEROL SULFATE HFA 108 (90 BASE) MCG/ACT IN AERS: 2.0000 | INHALATION_SPRAY | RESPIRATORY_TRACT | Status: DC | PRN

## 2019-10-19 MED ORDER — ONDANSETRON HCL 4 MG PO TABS: 4.0000 mg | ORAL_TABLET | Freq: Four times a day (QID) | ORAL | Status: DC | PRN

## 2019-10-19 NOTE — ED Triage Notes (Signed)
Pt via EMS from [place], pt is ward of the state. Staff called out for sick person, pt has been having a cough for a couple of days. Pt denies pain and NAD. On arrival, pt is A&Ox1, disoriented to time, place, and situation. Staff denies COPD, asthma, or CHF.

## 2019-10-19 NOTE — ED Notes (Signed)
Pt given lunch meal tray at this time.  

## 2019-10-19 NOTE — ED Notes (Signed)
On arrival, pt O2 sat on RA 88%. Pt placed on 2L [place], O2 94% at this time

## 2019-10-19 NOTE — Consult Note (Signed)
Pharmacy Antibiotic Note  Kathy Mckay is a 86 y.o. female with medical history including breast cancer, hypertension, hyperlipidemia admitted on 10/19/2019 with pneumonia.  Pharmacy has been consulted for vancomycin and cefepime dosing.  Plan: Cefepime 2 g IV q12h  Patient received vancomycin 1 g LD (~25 mg/kg)   Will order vancomycin 500 mg q24h maintenance regimen  Height: 4\' 9"  (144.8 cm) Weight: 40.8 kg (90 lb) IBW/kg (Calculated) : 38.6  Temp (24hrs), Avg:98.4 F (36.9 C), Min:98.4 F (36.9 C), Max:98.4 F (36.9 C)  Recent Labs  Lab 10/18/19 1525 10/19/19 0932  WBC 3.6* 2.6*  CREATININE 0.84 0.77  LATICACIDVEN  --  1.0    Estimated Creatinine Clearance: 31.3 mL/min (by C-G formula based on SCr of 0.77 mg/dL).    No Known Allergies  Antimicrobials this admission: Vancomycin 10/9 >>  Cefepime 10/9 >>   Dose adjustments this admission: n/a  Microbiology results: 10/9 BCx: pending 10/9 UCx: pending  10/9 MRSA PCR: pending  Thank you for allowing pharmacy to be a part of this patient's care.  Benita Gutter 10/19/2019 11:56 AM

## 2019-10-19 NOTE — Progress Notes (Signed)
PHARMACY -  BRIEF ANTIBIOTIC NOTE   Pharmacy has received consult(s) for Vancomycin and Cefepime from an ED provider.  The patient's profile has been reviewed for ht/wt/allergies/indication/available labs.    One time order(s) placed for Vancomycin 1g IV and Cefepime 2g IV x 1 dose each.  Further antibiotics/pharmacy consults should be ordered by admitting physician if indicated.                       Thank you, Pearla Dubonnet 10/19/2019  9:52 AM

## 2019-10-19 NOTE — ED Provider Notes (Signed)
[facility] Emergency Department Provider Note   ____________________________________________   First MD Initiated Contact with Patient 10/19/19 0913     (approximate)  I have reviewed the triage vital signs and the nursing notes.   HISTORY  Chief Complaint Trouble breathing  Attempted to contacted by unable to leave voicemail for Tina Griffiths (guardian) due to "voicemail box full"  EM caveat: Cognitive delay, poor historian, dyspnea  HPI Kathy Mckay is a 86 y.o. female who has a history of breast cancer, hypertension hyperlipidemia  Patient presents today evidently has been feeling sick for a couple of days per EMS. Patient's caretaker at her home noted that she was having trouble breathing. EMS reports patient was hypoxic correcting with nasal cannula. They noticed that she had frequent cough, mildly decreased end-tidal CO2.  Patient reports that she feels "I am fine" patient does not give any further history, but she does deny having chest pain. She does report feeling a little short of breath answering yes when asked this question     Past Medical History:  Diagnosis Date  . Breast cancer ([place])   . Cancer ([place])   . Developmental delay   . GERD (gastroesophageal reflux disease)   . Hyperlipidemia   . Hypertension   . Osteoporosis     Patient Active Problem List   Diagnosis Date Noted  . HCAP (healthcare-associated pneumonia) 10/19/2019  . Sepsis ([place]) 10/19/2019  . Kyphosis of cervicothoracic region 09/20/2017  . Malignant neoplasm of left breast in female, estrogen receptor positive ([place]) 07/28/2017  . Dysuria 08/03/2016  . Tachycardia 08/03/2016  . Elevated alkaline phosphatase level 04/27/2016  . Gastroesophageal reflux disease 04/27/2016  . Screening for breast cancer 10/28/2015  . Medicare annual wellness visit, subsequent 03/05/2014  . Other and unspecified hyperlipidemia 11/19/2012  . Urinary incontinence 03/27/2012  .  Osteoarthritis 12/26/2011  . Osteoporosis 03/15/2011  . Depression 03/01/2011  . Pernicious anemia 10/20/2010  . Hypertension 10/20/2010  . Hypothyroidism 10/20/2010  . Gait disturbance 10/20/2010  . Developmental delay 10/20/2010    Past Surgical History:  Procedure Laterality Date  . ABDOMINAL HYSTERECTOMY    . BREAST BIOPSY Left 07/19/2017   invasive mammary carcinoma  . BREAST BIOPSY Left 07/19/2017   invasive mammary carcinoma  . SHOULDER SURGERY      Prior to Admission medications   Medication Sig Start Date End Date Taking? Authorizing Provider  amLODipine (NORVASC) 10 MG tablet TAKE 1 TABLET BY MOUTH DAILY 07/31/19   Burnard Hawthorne, FNP  atorvastatin (LIPITOR) 40 MG tablet TAKE 1 TABLET BY MOUTH AT BEDTIME FOR CHOLESTEROL 07/31/19   Burnard Hawthorne, FNP  esomeprazole (NEXIUM) 40 MG capsule TAKE 1 CAPSULE BY MOUTH EVERY DAY AT 8:00 AM 09/30/19   Burnard Hawthorne, FNP  IBRANCE 75 MG tablet TAKE 1 TABLET (75 MG TOTAL) BY MOUTH DAILY. TAKE FOR 21 DAYS ON, 7 DAYS OFF, REPEAT EVERY 28 DAYS. 09/24/19   Sindy Guadeloupe, MD  Menthol, Topical Analgesic, (BIOFREEZE) 4 % GEL 1 application to painful area every 4 hours as needed 09/19/17   Jodelle Green, FNP  mirtazapine (REMERON) 45 MG tablet TAKE 1 TABLET BY MOUTH AT BEDTIME 07/31/19   Burnard Hawthorne, FNP  oxyCODONE-acetaminophen (PERCOCET/ROXICET) 5-325 MG tablet Take 1 tablet by mouth every 6 (six) hours as needed for severe pain. 05/13/19   Sindy Guadeloupe, MD  polyethylene glycol powder (GLYCOLAX/MIRALAX) 17 GM/SCOOP powder Take 17 g by mouth daily as needed for mild  constipation. 10/15/18   Leone Haven, MD  prochlorperazine (COMPAZINE) 10 MG tablet TAKE 1 TABLET BY MOUTH EVERY 8 HOURS AS NEEDED FOR NAUSEA OR VOMITING. 06/04/19   Sindy Guadeloupe, MD  tamoxifen (NOLVADEX) 20 MG tablet TAKE 1 TABLET BY MOUTH ONCE DAILY 09/28/19   Sindy Guadeloupe, MD  IBRANCE 75 MG capsule TAKE 1 CAPSULE (75 MG TOTAL) BY MOUTH DAILY WITH  BREAKFAST. TAKE WHOLE WITH FOOD. TAKE FOR 21 DAYS ON, 7 DAYS OFF, REPEAT EVERY 28 DAYS. 05/07/18   Sindy Guadeloupe, MD    Allergies Patient has no known allergies.  Family History  Problem Relation Age of Onset  . Diabetes Mother   . Diabetes Father   . Cancer Sister     Social History Social History   Tobacco Use  . Smoking status: Never Smoker  . Smokeless tobacco: Never Used  Vaping Use  . Vaping Use: Never used  Substance Use Topics  . Alcohol use: No  . Drug use: No    Review of Systems  EM caveat  Patient is able to tell me that she has slight shortness of breath, no chest pain, denies fever, has had a cough, denies abdominal pain or pain anywhere   ____________________________________________   PHYSICAL EXAM:  VITAL SIGNS: ED Triage Vitals  Enc Vitals Group     BP      Pulse      Resp      Temp      Temp src      SpO2      Weight      Height      Head Circumference      Peak Flow      Pain Score      Pain Loc      Pain Edu?      Excl. in [place]?     Constitutional: Alert and oriented to self. Generally ill-appearing, mildly tachypneic slight respiratory distress Eyes: Conjunctivae are normal. Head: Atraumatic. Nose: No congestion/rhinnorhea. Mouth/Throat: Mucous membranes are slightly dry. Neck: No stridor.  Cardiovascular: Normal rate, regular rhythm. Grossly normal heart sounds.  Good peripheral circulation. Respiratory: Patient has moderate crackles throughout both lungs, somewhat rhonchorous cough. Mild use of accessory muscles. Oxygen saturation 88% on room air, corrects to mid 90s on 2 L. No tripoding. No severe increase in work of breathing, but she does appear at least mild to moderately tachypneic Gastrointestinal: Soft and nontender. No distention. Musculoskeletal: No lower extremity tenderness does have slight bilateral lower extremity edema Neurologic:  Normal speech and language. No gross focal neurologic deficits are appreciated.    Skin:  Skin is warm, dry and intact. No rash noted. Psychiatric: Mood and affect are normal. Speech and behavior are normal.  ____________________________________________   LABS (all labs ordered are listed, but only abnormal results are displayed)  Labs Reviewed  COMPREHENSIVE METABOLIC PANEL - Abnormal; Notable for the following components:      Result Value   Potassium 3.1 (*)    Albumin 3.2 (*)    AST 43 (*)    All other components within normal limits  CBC WITH DIFFERENTIAL/PLATELET - Abnormal; Notable for the following components:   WBC 2.6 (*)    RBC 3.32 (*)    MCV 111.1 (*)    MCH 38.6 (*)    Platelets 102 (*)    All other components within normal limits  BRAIN NATRIURETIC PEPTIDE - Abnormal; Notable for the following components:   B Natriuretic  Peptide 204.4 (*)    All other components within normal limits  RESPIRATORY PANEL BY RT PCR (FLU A&B, COVID)  URINE CULTURE  CULTURE, BLOOD (ROUTINE X 2)  CULTURE, BLOOD (ROUTINE X 2)  LACTIC ACID, PLASMA  PROTIME-INR  APTT  LACTIC ACID, PLASMA  URINALYSIS, COMPLETE (UACMP) WITH MICROSCOPIC  PROCALCITONIN   ____________________________________________  EKG  Reviewed entered by me at 9:15 AM Heart rate 90 QRS 90 QTc 450 Normal sinus rhythm, left ventricular hypertrophy.  No evidence of acute ischemia.  Nonspecific T wave abnormality ____________________________________________  RADIOLOGY  DG Chest Port 1 View  Result Date: 10/19/2019 CLINICAL DATA:  Cough for several days. EXAM: PORTABLE CHEST 1 VIEW COMPARISON:  February 04, 2013 FINDINGS: The mediastinal contour is stable. The heart size is enlarged. Patchy consolidation of the bilateral lung bases, left greater than right are noted. There is no pleural effusion. The bony structures are stable. IMPRESSION: Patchy consolidation of bilateral lung bases, left greater than right, suspicious for pneumonias. Electronically Signed   By: Abelardo Diesel M.D.   On:  10/19/2019 10:32    Imaging reviewed, bilateral patchy consolidations.  Concerning for pneumonia ____________________________________________   PROCEDURES  Procedure(s) performed: None  Procedures  Critical Care performed: Yes, see critical care note(s)  CRITICAL CARE Performed by: Delman Kitten   Total critical care time: 35 minutes  Critical care time was exclusive of separately billable procedures and treating other patients.  Critical care was necessary to treat or prevent imminent or life-threatening deterioration.  Critical care was time spent personally by me on the following activities: development of treatment plan with patient and/or surrogate as well as nursing, discussions with consultants, evaluation of patient's response to treatment, examination of patient, obtaining history from patient or surrogate, ordering and performing treatments and interventions, ordering and review of laboratory studies, ordering and review of radiographic studies, pulse oximetry and re-evaluation of patient's condition.  ____________________________________________   INITIAL IMPRESSION / ASSESSMENT AND PLAN / ED COURSE  Pertinent labs & imaging results that were available during my care of the patient were reviewed by me and considered in my medical decision making (see chart for details).   Shortness of breath, crackles, cough, and not feeling well for 2 days.  Clinical examination history seem concerning for pneumonia versus Covid or other infectious etiology.  Differential diagnosis includes other etiologies such as CHF, cardiac, infectious, etc.  Mental status appears to be at baseline.  No acute neurologic symptoms no acute vascular symptoms.  ----------------------------------------- 10:43 AM on 10/19/2019 -----------------------------------------  In review of work-up to this point, patient noted to have neutropenia, mild tachypnea, multifocal appearing infiltrates on chest x-ray  and given her clinical examination and history I suspect multifocal pneumonia, exclude other etiologies such as influenza or COVID-19.  I do not see evidence of acute cardiac etiology at this point.  Discussed case with hospitalist Dr. Blaine Hamper at [phone] for admission.  Patient started on code sepsis pathway is meeting sepsis criteria, additionally cover broadly given the patient's history of leukopenia and active cancer, thus prompting broad-spectrum antibiotic    Kathy Mckay was evaluated in Emergency Department on 10/19/2019 for the symptoms described in the history of present illness. She was evaluated in the context of the global COVID-19 pandemic, which necessitated consideration that the patient might be at risk for infection with the SARS-CoV-2 virus that causes COVID-19. Institutional protocols and algorithms that pertain to the evaluation of patients at risk for COVID-19 are in a state of rapid  change based on information released by regulatory bodies including the CDC and federal and state organizations. These policies and algorithms were followed during the patient's care in the ED.   ____________________________________________   FINAL CLINICAL IMPRESSION(S) / ED DIAGNOSES  Final diagnoses:  Multifocal pneumonia  Sepsis, due to unspecified organism, unspecified whether acute organ dysfunction present [facility])  Hypoxia        Note:  This document was prepared using Dragon voice recognition software and may include unintentional dictation errors       Delman Kitten, MD 10/19/19 1050

## 2019-10-19 NOTE — Progress Notes (Signed)
CODE SEPSIS - PHARMACY COMMUNICATION  **Broad Spectrum Antibiotics should be administered within 1 hour of Sepsis diagnosis**  Time Code Sepsis Called/Page Received: 9914   Antibiotics Ordered: Vancomycin,Cefepime  Time of 1st antibiotic administration: 1004  Additional action taken by pharmacy: none  If necessary, Name of Provider/Nurse Contacted: n/a    Pearla Dubonnet ,PharmD Clinical Pharmacist  10/19/2019  10:15 AM

## 2019-10-19 NOTE — H&P (Signed)
History and Physical    Kathy Mckay GDJ:[id number] DOB: [birth year] DOA: 10/19/2019  Referring MD/NP/PA:   PCP: Burnard Hawthorne, FNP   Patient coming from:  The patient is coming from group home.  At baseline, pt dependent for most of ADL.        Chief Complaint: Cough and shortness of breath  HPI: Kathy Mckay is a 86 y.o. female with medical history significant of hypertension, hyperlipidemia, GERD, depression, breast cancer, developmental delay and mental retardation, pernicious anemia, thrombocytopenia, who presents with cough, shortness breath.  Pt is from [place]. She is ward of the state. I have tried to call her legal guardian without success, I could not leave message due to "voicemail box full". Pt is very poor historian, history is very limited.  Per report, patient was found to have shortness of breath and cough in the past 3 days.  She had oxygen desaturation to 87% on room air, which improved with 94% on 2 L oxygen in ED.  Patient denies chest pain.  No active nausea vomiting, diarrhea noted.  She moves all extremities.  ED Course: pt was found to have WBC 2.6, lactic acid is 1.0, INR 0.9, PTT 30, negative Covid PCR, potassium 3.1, renal function okay, temperature 98.4, blood pressure 140/84, heart rate 88, RR 25, chest x-ray showed bilateral basilar patchy infiltration (left is worse than the right).  Patient is admitted to the MedSurg bed as inpatient.   Review of Systems: Could not be reviewed accurately  Allergy: No Known Allergies  Past Medical History:  Diagnosis Date  . Breast cancer ([place])   . Cancer ([place])   . Developmental delay   . GERD (gastroesophageal reflux disease)   . Hyperlipidemia   . Hypertension   . Osteoporosis     Past Surgical History:  Procedure Laterality Date  . ABDOMINAL HYSTERECTOMY    . BREAST BIOPSY Left 07/19/2017   invasive mammary carcinoma  . BREAST BIOPSY Left 07/19/2017   invasive mammary carcinoma  . SHOULDER SURGERY       Social History:  reports that she has never smoked. She has never used smokeless tobacco. She reports that she does not drink alcohol and does not use drugs.  Family History:  Family History  Problem Relation Age of Onset  . Diabetes Mother   . Diabetes Father   . Cancer Sister      Prior to Admission medications   Medication Sig Start Date End Date Taking? Authorizing Provider  amLODipine (NORVASC) 10 MG tablet TAKE 1 TABLET BY MOUTH DAILY 07/31/19   Burnard Hawthorne, FNP  atorvastatin (LIPITOR) 40 MG tablet TAKE 1 TABLET BY MOUTH AT BEDTIME FOR CHOLESTEROL 07/31/19   Burnard Hawthorne, FNP  esomeprazole (NEXIUM) 40 MG capsule TAKE 1 CAPSULE BY MOUTH EVERY DAY AT 8:00 AM 09/30/19   Burnard Hawthorne, FNP  IBRANCE 75 MG tablet TAKE 1 TABLET (75 MG TOTAL) BY MOUTH DAILY. TAKE FOR 21 DAYS ON, 7 DAYS OFF, REPEAT EVERY 28 DAYS. 09/24/19   Sindy Guadeloupe, MD  Menthol, Topical Analgesic, (BIOFREEZE) 4 % GEL 1 application to painful area every 4 hours as needed 09/19/17   Jodelle Green, FNP  mirtazapine (REMERON) 45 MG tablet TAKE 1 TABLET BY MOUTH AT BEDTIME 07/31/19   Burnard Hawthorne, FNP  oxyCODONE-acetaminophen (PERCOCET/ROXICET) 5-325 MG tablet Take 1 tablet by mouth every 6 (six) hours as needed for severe pain. 05/13/19   Sindy Guadeloupe, MD  polyethylene  glycol powder (GLYCOLAX/MIRALAX) 17 GM/SCOOP powder Take 17 g by mouth daily as needed for mild constipation. 10/15/18   Leone Haven, MD  prochlorperazine (COMPAZINE) 10 MG tablet TAKE 1 TABLET BY MOUTH EVERY 8 HOURS AS NEEDED FOR NAUSEA OR VOMITING. 06/04/19   Sindy Guadeloupe, MD  tamoxifen (NOLVADEX) 20 MG tablet TAKE 1 TABLET BY MOUTH ONCE DAILY 09/28/19   Sindy Guadeloupe, MD  IBRANCE 75 MG capsule TAKE 1 CAPSULE (75 MG TOTAL) BY MOUTH DAILY WITH BREAKFAST. TAKE WHOLE WITH FOOD. TAKE FOR 21 DAYS ON, 7 DAYS OFF, REPEAT EVERY 28 DAYS. 05/07/18   Sindy Guadeloupe, MD    Physical Exam: Vitals:   10/19/19 1200 10/19/19 1230 10/19/19  1330 10/19/19 1400  BP: 120/78 128/71 121/82 (!) 130/91  Pulse: 95 78 86 88  Resp: (!) 25 (!) 25 (!) 24 (!) 24  Temp:      TempSrc:      SpO2: 91% 93% 94% 92%  Weight:      Height:       General: Not in acute distress HEENT:       Eyes: PERRL, EOMI, no scleral icterus.       ENT: No discharge from the ears and nose       Neck: No JVD, no bruit, no mass felt. Heme: No neck lymph node enlargement. Cardiac: S1/S2, RRR, No murmurs, No gallops or rubs. Respiratory: Has fine crackles bilaterally. GI: Soft, nondistended, nontender,  no organomegaly, BS present. GU: No hematuria Ext: No pitting leg edema bilaterally. 1+DP/PT pulse bilaterally. Musculoskeletal: No joint deformities, No joint redness or warmth, no limitation of ROM in spin. Skin: No rashes.  Neuro: Alert, cranial nerves II-XII grossly intact, moves all extremities  Psych: Patient is not psychotic, no suicidal or hemocidal ideation.  Labs on Admission: I have personally reviewed following labs and imaging studies  CBC: Recent Labs  Lab 10/18/19 1525 10/19/19 0932  WBC 3.6* 2.6*  NEUTROABS 3.2 2.2  HGB 12.8 12.8  HCT 35.8* 36.9  MCV 108.8* 111.1*  PLT 125* 814*   Basic Metabolic Panel: Recent Labs  Lab 10/18/19 1525 10/19/19 0932  NA 134* 136  K 3.2* 3.1*  CL 99 100  CO2 23 26  GLUCOSE 133* 94  BUN 26* 22  CREATININE 0.84 0.77  CALCIUM 8.3* 8.9  MG  --  2.2   GFR: Estimated Creatinine Clearance: 31.3 mL/min (by C-G formula based on SCr of 0.77 mg/dL). Liver Function Tests: Recent Labs  Lab 10/18/19 1525 10/19/19 0932  AST 49* 43*  ALT 27 25  ALKPHOS 69 69  BILITOT 0.9 0.9  PROT 6.9 7.1  ALBUMIN 3.1* 3.2*   No results for input(s): LIPASE, AMYLASE in the last 168 hours. No results for input(s): AMMONIA in the last 168 hours. Coagulation Profile: Recent Labs  Lab 10/19/19 0932  INR 0.9   Cardiac Enzymes: No results for input(s): CKTOTAL, CKMB, CKMBINDEX, TROPONINI in the last 168  hours. BNP (last 3 results) No results for input(s): PROBNP in the last 8760 hours. HbA1C: No results for input(s): HGBA1C in the last 72 hours. CBG: No results for input(s): GLUCAP in the last 168 hours. Lipid Profile: No results for input(s): CHOL, HDL, LDLCALC, TRIG, CHOLHDL, LDLDIRECT in the last 72 hours. Thyroid Function Tests: No results for input(s): TSH, T4TOTAL, FREET4, T3FREE, THYROIDAB in the last 72 hours. Anemia Panel: No results for input(s): VITAMINB12, FOLATE, FERRITIN, TIBC, IRON, RETICCTPCT in the last 72 hours. Urine analysis:  Component Value Date/Time   COLORURINE AMBER (A) 10/19/2019 0932   APPEARANCEUR CLOUDY (A) 10/19/2019 0932   LABSPEC 1.011 10/19/2019 0932   PHURINE 6.0 10/19/2019 0932   GLUCOSEU NEGATIVE 10/19/2019 0932   GLUCOSEU NEGATIVE 08/12/2016 1419   HGBUR MODERATE (A) 10/19/2019 0932   BILIRUBINUR NEGATIVE 10/19/2019 0932   BILIRUBINUR negative 10/01/2015 0926   KETONESUR NEGATIVE 10/19/2019 0932   PROTEINUR 30 (A) 10/19/2019 0932   UROBILINOGEN 0.2 08/12/2016 1419   NITRITE NEGATIVE 10/19/2019 0932   LEUKOCYTESUR LARGE (A) 10/19/2019 0932   Sepsis Labs: @LABRCNTIP (procalcitonin:4,lacticidven:4) ) Recent Results (from the past 240 hour(s))  Respiratory Panel by RT PCR (Flu A&B, Covid) - Nasopharyngeal Swab     Status: None   Collection Time: 10/19/19  9:32 AM   Specimen: Nasopharyngeal Swab  Result Value Ref Range Status   SARS Coronavirus 2 by RT PCR NEGATIVE NEGATIVE Final    Comment: (NOTE) SARS-CoV-2 target nucleic acids are NOT DETECTED.  The SARS-CoV-2 RNA is generally detectable in upper respiratoy specimens during the acute phase of infection. The lowest concentration of SARS-CoV-2 viral copies this assay can detect is 131 copies/mL. A negative result does not preclude SARS-Cov-2 infection and should not be used as the sole basis for treatment or other patient management decisions. A negative result may occur with   improper specimen collection/handling, submission of specimen other than nasopharyngeal swab, presence of viral mutation(s) within the areas targeted by this assay, and inadequate number of viral copies (<131 copies/mL). A negative result must be combined with clinical observations, patient history, and epidemiological information. The expected result is Negative.  Fact Sheet for Patients:  PinkCheek.be  Fact Sheet for Healthcare Providers:  GravelBags.it  This test is no t yet approved or cleared by the [place] FDA and  has been authorized for detection and/or diagnosis of SARS-CoV-2 by FDA under an Emergency Use Authorization (EUA). This EUA will remain  in effect (meaning this test can be used) for the duration of the COVID-19 declaration under Section 564(b)(1) of the Act, 21 U.S.C. section 360bbb-3(b)(1), unless the authorization is terminated or revoked sooner.     Influenza A by PCR NEGATIVE NEGATIVE Final   Influenza B by PCR NEGATIVE NEGATIVE Final    Comment: (NOTE) The Xpert Xpress SARS-CoV-2/FLU/RSV assay is intended as an aid in  the diagnosis of influenza from Nasopharyngeal swab specimens and  should not be used as a sole basis for treatment. Nasal washings and  aspirates are unacceptable for Xpert Xpress SARS-CoV-2/FLU/RSV  testing.  Fact Sheet for Patients: PinkCheek.be  Fact Sheet for Healthcare Providers: GravelBags.it  This test is not yet approved or cleared by the [place] FDA and  has been authorized for detection and/or diagnosis of SARS-CoV-2 by  FDA under an Emergency Use Authorization (EUA). This EUA will remain  in effect (meaning this test can be used) for the duration of the  Covid-19 declaration under Section 564(b)(1) of the Act, 21  U.S.C. section 360bbb-3(b)(1), unless the authorization is  terminated or  revoked. Performed at [facility], [address] [place], [place] [address]      Radiological Exams on Admission: DG Chest [facility] 1 View  Result Date: 10/19/2019 CLINICAL DATA:  Cough for several days. EXAM: PORTABLE CHEST 1 VIEW COMPARISON:  February 04, 2013 FINDINGS: The mediastinal contour is stable. The heart size is enlarged. Patchy consolidation of the bilateral lung bases, left greater than right are noted. There is no pleural effusion. The bony structures  are stable. IMPRESSION: Patchy consolidation of bilateral lung bases, left greater than right, suspicious for pneumonias. Electronically Signed   By: Abelardo Diesel M.D.   On: 10/19/2019 10:32     EKG: I have personally reviewed.  Sinus rhythm, QTC 453, low voltage, LAD, nonspecific T wave change  Assessment/Plan Principal Problem:   HCAP (healthcare-associated pneumonia) Active Problems:   Hypertension   Depression   Gastroesophageal reflux disease   Malignant neoplasm of left breast in female, estrogen receptor positive (HCC)   Sepsis ([place])   Thrombocytopenia (HCC)   Hypokalemia   Sepsis due to HCAP (healthcare-associated pneumonia): Patient meets criteria for sepsis with WBC 2.6 (less than 4), tachypnea with RR 25.  Currently hemodynamically stable.  Lactic acid is normal.  - Will admit to med-surg bed as inpt - IV Vancomycin and cefepime, - Mucinex for cough  - Bronchodilators - Urine legionella and S. pneumococcal antigen - Follow up blood culture x2, sputum culture - will get Procalcitonin and trend lactic acid level per sepsis protocol - IVF: NS at 75 mL per hour  Hypertension -IV hydralazine as needed -Amlodipine  Depression -Continue home medications  Gastroesophageal reflux disease -Protonix  Malignant neoplasm of left breast in female, estrogen receptor positive ([place]) -Continue tamoxifen and Ibrance  Thrombocytopenia ([place]): This is chronic issue.  Platelet 102, no active  bleeding -Follow-up with CBC  Hypokalemia: Potassium 3.1 -Repleted potassium -Check magnesium level      DVT ppx:  SQ Lovenox Code Status: Full code Family Communication: not done, no family member is at bed side.     Disposition Plan:  Anticipate discharge back to previous group environment Consults called: None  admission status: Med-surg bed ss inpt       Status is: Inpatient  Remains inpatient appropriate because:Inpatient level of care appropriate due to severity of illness.  Patient has multiple comorbidities, now presents with HCAP and sepsis.  Her presentation is highly complicated.  She is at high risk of deterioration.  Patient will need to be treated in hospital for at least 2 days.   Dispo: The patient is from: Group home              Anticipated d/c is to: Group home              Anticipated d/c date is: 2 days              Patient currently is not medically stable to d/c.           Date of Service 10/19/2019    Ivor Costa [facility]   If 7PM-7AM, please contact night-coverage [email or web address] 10/19/2019, 2:26 PM

## 2019-10-20 DIAGNOSIS — J189 Pneumonia, unspecified organism: Secondary | ICD-10-CM | POA: Diagnosis not present

## 2019-10-20 DIAGNOSIS — K219 Gastro-esophageal reflux disease without esophagitis: Secondary | ICD-10-CM | POA: Diagnosis not present

## 2019-10-20 DIAGNOSIS — I10 Essential (primary) hypertension: Secondary | ICD-10-CM | POA: Diagnosis not present

## 2019-10-20 DIAGNOSIS — E876 Hypokalemia: Secondary | ICD-10-CM | POA: Diagnosis not present

## 2019-10-20 LAB — BASIC METABOLIC PANEL
Anion gap: 11 (ref 5–15)
BUN: 15 mg/dL (ref 8–23)
CO2: 26 mmol/L (ref 22–32)
Calcium: 8.3 mg/dL — ABNORMAL LOW (ref 8.9–10.3)
Chloride: 101 mmol/L (ref 98–111)
Creatinine, Ser: 0.61 mg/dL (ref 0.44–1.00)
GFR, Estimated: >60 mL/min (ref >60)
Glucose, Bld: 81 mg/dL (ref 70–99)
Potassium: 3.5 mmol/L (ref 3.5–5.1)
Sodium: 138 mmol/L (ref 135–145)

## 2019-10-20 LAB — BLOOD GAS, ARTERIAL
Acid-Base Excess: 3.5 mmol/L — ABNORMAL HIGH (ref 0.0–2.0)
Bicarbonate: 25.1 mmol/L (ref 20.0–28.0)
FIO2: 0.4
O2 Saturation: 98.8 %
Patient temperature: 37
pCO2 arterial: 28 mmHg — ABNORMAL LOW (ref 32.0–48.0)
pH, Arterial: 7.56 — ABNORMAL HIGH (ref 7.350–7.450)
pO2, Arterial: 106 mmHg (ref 83.0–108.0)

## 2019-10-20 LAB — URINE CULTURE

## 2019-10-20 LAB — CBC
HCT: 31.3 % — ABNORMAL LOW (ref 36.0–46.0)
Hemoglobin: 11 g/dL — ABNORMAL LOW (ref 12.0–15.0)
MCH: 38.9 pg — ABNORMAL HIGH (ref 26.0–34.0)
MCHC: 35.1 g/dL (ref 30.0–36.0)
MCV: 110.6 fL — ABNORMAL HIGH (ref 80.0–100.0)
Platelets: 82 10*3/uL — ABNORMAL LOW (ref 150–400)
RBC: 2.83 MIL/uL — ABNORMAL LOW (ref 3.87–5.11)
RDW: 12.9 % (ref 11.5–15.5)
WBC: 2.7 10*3/uL — ABNORMAL LOW (ref 4.0–10.5)
nRBC: 0 % (ref 0.0–0.2)

## 2019-10-20 LAB — STREP PNEUMONIAE URINARY ANTIGEN: Strep Pneumo Urinary Antigen: POSITIVE — AB

## 2019-10-20 LAB — MRSA PCR SCREENING: MRSA by PCR: NEGATIVE

## 2019-10-20 MED ORDER — FUROSEMIDE 10 MG/ML IJ SOLN
20.0000 mg | Freq: Once | INTRAMUSCULAR | Status: AC
  Administered 2019-10-20: 20 mg via INTRAVENOUS
  Filled 2019-10-20: qty 4

## 2019-10-20 MED ORDER — IPRATROPIUM-ALBUTEROL 0.5-2.5 (3) MG/3ML IN SOLN: 3.0000 mL | RESPIRATORY_TRACT | Status: DC | PRN

## 2019-10-20 NOTE — Progress Notes (Signed)
Pt refused CT. Pt transported back to room 132.

## 2019-10-20 NOTE — Progress Notes (Signed)
PROGRESS NOTE    Kathy Mckay  IFO:[id number] DOB: January 18, 1932 DOA: 10/19/2019 PCP: Burnard Hawthorne, FNP    Chief Complaint  Patient presents with  . Cough    Brief Narrative:   86 year old lady prior history of hypertension, hyperlipidemia, depression breast cancer, mental retardation, pernicious anemia, thrombocytopenia presents to ED with shortness of breath and cough.  Chest x-ray showed bilateral patchy infiltrates left worse than the right.  She was admitted for evaluation and management of healthcare associated pneumonia.  Patient is from group home and is ward of the state. Assessment & Plan:   Principal Problem:   HCAP (healthcare-associated pneumonia) Active Problems:   Hypertension   Depression   Gastroesophageal reflux disease   Malignant neoplasm of left breast in female, estrogen receptor positive ([place])   Thrombocytopenia ([place])   Hypokalemia   Sepsis On admission patient was tachypneic with a respiratory rate rate of 25/min, WBC count less than 4 and chest x-ray findings showing bilateral infiltrates consistent with healthcare associated pneumonia. Acute respiratory failure with hypoxia requiring 2 L of nasal cannula oxygen to keep sats greater than 90% secondary to healthcare associated pneumonia evident with bilateral patchy infiltrates on chest x-ray. Patient reports her breathing is the same as yesterday continues to cough. Resume nasal cannula oxygen to keep sats greater than 90%.  Continue with Robitussin. Follow blood cultures and obtain sputum cultures if able.    Essential hypertension Blood pressure parameters are optimal at this time.    Thrombocytopenia Appears to be chronic. No signs of bleeding at this time.    GERD Stable.   Elevated BNP    Hypokalemia Replaced    DVT prophylaxis: SCDs Code Status: Full code Family Communication: None at bedside Disposition:   Status is: Inpatient  Remains inpatient appropriate  because:IV treatments appropriate due to intensity of illness or inability to take PO   Dispo: The patient is from: Home              Anticipated d/c is to: pending              Anticipated d/c date is: 2 days              Patient currently is not medically stable to d/c.       Consultants:   None.    Procedures: none.    Antimicrobials:  Antibiotics Given (last 72 hours)    Date/Time Action Medication Dose Rate   10/19/19 1004 New Bag/Given   ceFEPIme (MAXIPIME) 2 g in sodium chloride 0.9 % 100 mL IVPB 2 g 200 mL/hr   10/19/19 1008 New Bag/Given   vancomycin (VANCOCIN) IVPB 1000 mg/200 mL premix 1,000 mg 200 mL/hr   10/19/19 2200 New Bag/Given   ceFEPIme (MAXIPIME) 2 g in sodium chloride 0.9 % 100 mL IVPB 2 g 200 mL/hr   10/20/19 1016 New Bag/Given   ceFEPIme (MAXIPIME) 2 g in sodium chloride 0.9 % 100 mL IVPB 2 g 200 mL/hr          Subjective: No chest pain or sob, no new complaints.   Objective: Vitals:   10/19/19 1841 10/19/19 2025 10/20/19 0045 10/20/19 0100  BP: 124/78 114/65 119/85   Pulse: 84 82 84   Resp: 14 19 17    Temp: 97.6 F (36.4 C) 98.2 F (36.8 C) 98.6 F (37 C)   TempSrc: Oral Oral Oral   SpO2: 94% 93% (!) 89% 92%  Weight:      Height:  Intake/Output Summary (Last 24 hours) at 10/20/2019 1053 Last data filed at 10/19/2019 1118 Gross per 24 hour  Intake 250 ml  Output --  Net 250 ml   Filed Weights   10/19/19 0929  Weight: 40.8 kg    Examination:  General exam: Appears calm and comfortable on 2 L of nasal cannula oxygen Respiratory system: Diminished air entry at bases, no wheezing or rhonchi Cardiovascular system: S1 & S2 heard, RRR. No JVD,  No pedal edema. Gastrointestinal system: Abdomen is nondistended, soft and nontender.  Normal bowel sounds heard. Central nervous system: Alert but confused Extremities: No pedal edema Skin: No rashes, Psychiatry: Mood appears appropriate    Data Reviewed: I have personally  reviewed following labs and imaging studies  CBC: Recent Labs  Lab 10/18/19 1525 10/19/19 0932 10/20/19 0430  WBC 3.6* 2.6* 2.7*  NEUTROABS 3.2 2.2  --   HGB 12.8 12.8 11.0*  HCT 35.8* 36.9 31.3*  MCV 108.8* 111.1* 110.6*  PLT 125* 102* 82*    Basic Metabolic Panel: Recent Labs  Lab 10/18/19 1525 10/19/19 0932 10/20/19 0430  NA 134* 136 138  K 3.2* 3.1* 3.5  CL 99 100 101  CO2 23 26 26   GLUCOSE 133* 94 81  BUN 26* 22 15  CREATININE 0.84 0.77 0.61  CALCIUM 8.3* 8.9 8.3*  MG  --  2.2  --     GFR: Estimated Creatinine Clearance: 31.3 mL/min (by C-G formula based on SCr of 0.61 mg/dL).  Liver Function Tests: Recent Labs  Lab 10/18/19 1525 10/19/19 0932  AST 49* 43*  ALT 27 25  ALKPHOS 69 69  BILITOT 0.9 0.9  PROT 6.9 7.1  ALBUMIN 3.1* 3.2*    CBG: No results for input(s): GLUCAP in the last 168 hours.   Recent Results (from the past 240 hour(s))  Culture, blood (Routine X 2) w Reflex to ID Panel     Status: None (Preliminary result)   Collection Time: 10/19/19  9:32 AM   Specimen: BLOOD  Result Value Ref Range Status   Specimen Description BLOOD RAC  Final   Special Requests   Final    BOTTLES DRAWN AEROBIC AND ANAEROBIC Blood Culture adequate volume   Culture   Final    NO GROWTH < 24 HOURS Performed at [facility], [address], [place], [place] [address]    Report Status PENDING  Incomplete  Culture, blood (Routine X 2) w Reflex to ID Panel     Status: None (Preliminary result)   Collection Time: 10/19/19  9:32 AM   Specimen: BLOOD  Result Value Ref Range Status   Specimen Description BLOOD RIGHT FORE ARM  Final   Special Requests   Final    BOTTLES DRAWN AEROBIC AND ANAEROBIC Blood Culture adequate volume   Culture   Final    NO GROWTH < 24 HOURS Performed at [facility], [address], [place], [place] [address]    Report Status PENDING  Incomplete  Respiratory Panel by RT PCR (Flu A&B, Covid) - Nasopharyngeal  Swab     Status: None   Collection Time: 10/19/19  9:32 AM   Specimen: Nasopharyngeal Swab  Result Value Ref Range Status   SARS Coronavirus 2 by RT PCR NEGATIVE NEGATIVE Final    Comment: (NOTE) SARS-CoV-2 target nucleic acids are NOT DETECTED.  The SARS-CoV-2 RNA is generally detectable in upper respiratoy specimens during the acute phase of infection. The lowest concentration of SARS-CoV-2 viral copies this assay can  detect is 131 copies/mL. A negative result does not preclude SARS-Cov-2 infection and should not be used as the sole basis for treatment or other patient management decisions. A negative result may occur with  improper specimen collection/handling, submission of specimen other than nasopharyngeal swab, presence of viral mutation(s) within the areas targeted by this assay, and inadequate number of viral copies (<131 copies/mL). A negative result must be combined with clinical observations, patient history, and epidemiological information. The expected result is Negative.  Fact Sheet for Patients:  PinkCheek.be  Fact Sheet for Healthcare Providers:  GravelBags.it  This test is no t yet approved or cleared by the [place] FDA and  has been authorized for detection and/or diagnosis of SARS-CoV-2 by FDA under an Emergency Use Authorization (EUA). This EUA will remain  in effect (meaning this test can be used) for the duration of the COVID-19 declaration under Section 564(b)(1) of the Act, 21 U.S.C. section 360bbb-3(b)(1), unless the authorization is terminated or revoked sooner.     Influenza A by PCR NEGATIVE NEGATIVE Final   Influenza B by PCR NEGATIVE NEGATIVE Final    Comment: (NOTE) The Xpert Xpress SARS-CoV-2/FLU/RSV assay is intended as an aid in  the diagnosis of influenza from Nasopharyngeal swab specimens and  should not be used as a sole basis for treatment. Nasal washings and  aspirates are  unacceptable for Xpert Xpress SARS-CoV-2/FLU/RSV  testing.  Fact Sheet for Patients: PinkCheek.be  Fact Sheet for Healthcare Providers: GravelBags.it  This test is not yet approved or cleared by the [place] FDA and  has been authorized for detection and/or diagnosis of SARS-CoV-2 by  FDA under an Emergency Use Authorization (EUA). This EUA will remain  in effect (meaning this test can be used) for the duration of the  Covid-19 declaration under Section 564(b)(1) of the Act, 21  U.S.C. section 360bbb-3(b)(1), unless the authorization is  terminated or revoked. Performed at [facility], [address] [place], [place] [address]          Radiology Studies: [facility] 1 View  Result Date: 10/19/2019 CLINICAL DATA:  Cough for several days. EXAM: PORTABLE CHEST 1 VIEW COMPARISON:  February 04, 2013 FINDINGS: The mediastinal contour is stable. The heart size is enlarged. Patchy consolidation of the bilateral lung bases, left greater than right are noted. There is no pleural effusion. The bony structures are stable. IMPRESSION: Patchy consolidation of bilateral lung bases, left greater than right, suspicious for pneumonias. Electronically Signed   By: Abelardo Diesel M.D.   On: 10/19/2019 10:32        Scheduled Meds: Continuous Infusions: . ceFEPime (MAXIPIME) IV 2 g (10/20/19 1016)  . vancomycin       LOS: 1 day        Hosie Poisson, MD [facility]   To contact the attending provider between 7A-7P or the covering provider during after hours 7P-7A, please log into the web site [email or web address] and access using universal [place] password for that web site. If you do not have the password, please call the hospital operator.  10/20/2019, 10:53 AM

## 2019-10-20 NOTE — Progress Notes (Signed)
°   10/20/19 1251  Assess: MEWS Score  Temp 98.4 F (36.9 C)  BP (!) 111/55  Pulse Rate 81  Resp (!) 27  Level of Consciousness Alert  SpO2 96 %  O2 Device Nasal Cannula  O2 Flow Rate (L/min) 5 L/min  Assess: MEWS Score  MEWS Temp 0  MEWS Systolic 0  MEWS Pulse 0  MEWS RR 2  MEWS LOC 0  MEWS Score 2  MEWS Score Color Yellow  Assess: if the MEWS score is Yellow or Red  Were vital signs taken at a resting state? Yes  Focused Assessment Change from prior assessment (see assessment flowsheet)  Early Detection of Sepsis Score *See Row Information* Low  MEWS guidelines implemented *See Row Information* Yes  Treat  MEWS Interventions Consulted Respiratory Therapy  Complains of Coughing;Shortness of breath;Other (Comment) (increase respirations)  Take Vital Signs  Increase Vital Sign Frequency  Yellow: Q 2hr X 2 then Q 4hr X 2, if remains yellow, continue Q 4hrs  Escalate  MEWS: Escalate Yellow: discuss with charge nurse/RN and consider discussing with provider and RRT  Notify: Charge Nurse/RN  Name of Charge Nurse/RN Notified Alcario Drought RN  Date Charge Nurse/RN Notified 10/20/19  Time Charge Nurse/RN Notified 1257  Notify: Provider  Provider Name/Title Dr. Karleen Hampshire  Date Provider Notified 10/20/19  Time Provider Notified 1257  Notification Type Page  Notification Reason Other (Comment) (Increase respiration/ desat/ )  Response See new orders  Date of Provider Response 10/20/19  Time of Provider Response 1259  Document  Progress note created (see row info) Yes

## 2019-10-21 ENCOUNTER — Inpatient Hospital Stay: Payer: Medicare Other

## 2019-10-21 ENCOUNTER — Encounter: Payer: Self-pay | Admitting: Internal Medicine

## 2019-10-21 DIAGNOSIS — I10 Essential (primary) hypertension: Secondary | ICD-10-CM | POA: Diagnosis not present

## 2019-10-21 DIAGNOSIS — E876 Hypokalemia: Secondary | ICD-10-CM | POA: Diagnosis not present

## 2019-10-21 DIAGNOSIS — K219 Gastro-esophageal reflux disease without esophagitis: Secondary | ICD-10-CM | POA: Diagnosis not present

## 2019-10-21 DIAGNOSIS — J189 Pneumonia, unspecified organism: Secondary | ICD-10-CM | POA: Diagnosis not present

## 2019-10-21 LAB — CBC
HCT: 35.7 % — ABNORMAL LOW (ref 36.0–46.0)
Hemoglobin: 12.1 g/dL (ref 12.0–15.0)
MCH: 38.7 pg — ABNORMAL HIGH (ref 26.0–34.0)
MCHC: 33.9 g/dL (ref 30.0–36.0)
MCV: 114.1 fL — ABNORMAL HIGH (ref 80.0–100.0)
Platelets: 78 10*3/uL — ABNORMAL LOW (ref 150–400)
RBC: 3.13 MIL/uL — ABNORMAL LOW (ref 3.87–5.11)
RDW: 13 % (ref 11.5–15.5)
WBC: 2.6 10*3/uL — ABNORMAL LOW (ref 4.0–10.5)
nRBC: 0 % (ref 0.0–0.2)

## 2019-10-21 MED ORDER — SODIUM CHLORIDE 0.9 % IV SOLN
2.0000 g | INTRAVENOUS | Status: DC
  Administered 2019-10-21 – 2019-10-22 (×2): 2 g via INTRAVENOUS
  Filled 2019-10-21 (×2): qty 20
  Filled 2019-10-21: qty 2

## 2019-10-21 MED ORDER — SODIUM CHLORIDE 0.9 % IV SOLN: INTRAVENOUS | Status: DC

## 2019-10-21 MED ORDER — IOHEXOL 300 MG/ML  SOLN
70.0000 mL | Freq: Once | INTRAMUSCULAR | Status: AC | PRN
  Administered 2019-10-21: 70 mL via INTRAVENOUS

## 2019-10-21 MED ORDER — ADULT MULTIVITAMIN W/MINERALS CH
1.0000 | ORAL_TABLET | Freq: Every day | ORAL | Status: DC
  Administered 2019-10-21 – 2019-10-22 (×2): 1 via ORAL
  Filled 2019-10-21 (×2): qty 1

## 2019-10-21 MED ORDER — IOHEXOL 300 MG/ML  SOLN
60.0000 mL | Freq: Once | INTRAMUSCULAR | Status: AC | PRN
  Administered 2019-10-21: 60 mL via INTRAVENOUS

## 2019-10-21 MED ORDER — AZITHROMYCIN 500 MG PO TABS
500.0000 mg | ORAL_TABLET | Freq: Every day | ORAL | Status: DC
  Administered 2019-10-21: 500 mg via ORAL
  Filled 2019-10-21: qty 1

## 2019-10-21 MED ORDER — IOHEXOL 9 MG/ML PO SOLN: 500.0000 mL | ORAL | Status: AC

## 2019-10-21 NOTE — Progress Notes (Signed)
PROGRESS NOTE    Kathy Mckay  YWV:[id number] DOB: 04-May-1932 DOA: 10/19/2019 PCP: Burnard Hawthorne, FNP    Chief Complaint  Patient presents with  . Cough    Brief Narrative:   86 year old lady prior history of hypertension, hyperlipidemia, depression breast cancer, mental retardation, pernicious anemia, thrombocytopenia presents to ED with shortness of breath and cough.  Chest x-ray showed bilateral patchy infiltrates left worse than the right.  She was admitted for evaluation and management of healthcare associated pneumonia.  Patient is from group home and is ward of the state. Pt seen and examined at bedside. Pt has baseline mental retardation and is a ward of the state. Unable to reach the guardian at this time. I have called [phone] and [phone] multiple times and left 2 messages . CT chest with contrast showed Mild bilateral posterior basilar subsegmental atelectasis or infiltrates are noted.  Mild left upper lobe opacity is noted concerning for pneumonia or atelectasis. 1.5 cm rounded hypodensity is noted in posterior portion of right hepatic lobe which appears to be enlarged compared to prior exam, concerning for worsening metastatic lesion.   Assessment & Plan:   Principal Problem:   HCAP (healthcare-associated pneumonia) Active Problems:   Hypertension   Depression   Gastroesophageal reflux disease   Malignant neoplasm of left breast in female, estrogen receptor positive ([place])   Thrombocytopenia ([place])   Hypokalemia   Sepsis secondary to health care associated pneumonia:  On admission patient was tachypneic with a respiratory rate rate of 25/min, WBC count less than 4 and chest x-ray findings showing bilateral infiltrates consistent with healthcare associated pneumonia. Acute respiratory failure with hypoxia requiring 2 L of nasal cannula oxygen to keep sats greater than 90% secondary to healthcare associated pneumonia evident with bilateral patchy infiltrates  on chest x-ray.. Transition broad spectrum IV antibiotics to IV rocephin. Resume nasal cannula oxygen to keep sats greater than 90%.  Continue with Robitussin. Follow blood cultures and obtain sputum cultures if able. Urine for strep pneumonia is positive. Will d/c azithromycin.  Urine cultures show multiple bacteria/ species.    Essential hypertension BP parameters are optimal.     Thrombocytopenia Appears to be chronic. No signs of bleeding at this time.    GERD Stable.   Elevated BNP: One dose of IV lasix given.     Hypokalemia Replaced.   Right hepatic lobe 1.5 cm lesions: - further evaluation with a CT abd and pelvis.    In view of her age, multiple co morbidities, clinical deterioration, poor oral intake, dysphagia, possible malignancy in the liver, palliative care consulted for goals of care.     DVT prophylaxis: SCDs Code Status: Full code Family Communication: None at bedside  Disposition:   Status is: Inpatient  Remains inpatient appropriate because:IV treatments appropriate due to intensity of illness or inability to take PO   Dispo: The patient is from: Home              Anticipated d/c is to: pending              Anticipated d/c date is: 2 days              Patient currently is not medically stable to d/c.       Consultants:   None.    Procedures: none.    Antimicrobials:  Antibiotics Given (last 72 hours)    Date/Time Action Medication Dose Rate   10/19/19 1004 New Bag/Given   ceFEPIme (MAXIPIME)  2 g in sodium chloride 0.9 % 100 mL IVPB 2 g 200 mL/hr   10/19/19 1008 New Bag/Given   vancomycin (VANCOCIN) IVPB 1000 mg/200 mL premix 1,000 mg 200 mL/hr   10/19/19 2200 New Bag/Given   ceFEPIme (MAXIPIME) 2 g in sodium chloride 0.9 % 100 mL IVPB 2 g 200 mL/hr   10/20/19 1016 New Bag/Given   ceFEPIme (MAXIPIME) 2 g in sodium chloride 0.9 % 100 mL IVPB 2 g 200 mL/hr   10/20/19 1115 New Bag/Given   vancomycin (VANCOREADY) IVPB 500  mg/100 mL 500 mg 100 mL/hr   10/20/19 2057 New Bag/Given   ceFEPIme (MAXIPIME) 2 g in sodium chloride 0.9 % 100 mL IVPB 2 g 200 mL/hr   10/21/19 1016 New Bag/Given   ceFEPIme (MAXIPIME) 2 g in sodium chloride 0.9 % 100 mL IVPB 2 g 200 mL/hr   10/21/19 1243 Given   azithromycin (ZITHROMAX) tablet 500 mg 500 mg    10/21/19 1517 New Bag/Given   cefTRIAXone (ROCEPHIN) 2 g in sodium chloride 0.9 % 100 mL IVPB 2 g 200 mL/hr         Subjective: Appears comfortable.   Objective: Vitals:   10/21/19 0131 10/21/19 0337 10/21/19 0717 10/21/19 0815  BP: 122/62 109/65  126/65  Pulse: 69 68  71  Resp: 16 18  18   Temp: 98 F (36.7 C) 98.5 F (36.9 C)  97.8 F (36.6 C)  TempSrc: Oral Oral  Oral  SpO2: 100% 100% 94% 97%  Weight:      Height:        Intake/Output Summary (Last 24 hours) at 10/21/2019 1520 Last data filed at 10/21/2019 1414 Gross per 24 hour  Intake 420 ml  Output 500 ml  Net -80 ml   Filed Weights   10/19/19 0929  Weight: 40.8 kg    Examination:  General exam: Alert and comfortable not in distress on 2 L of nasal cannula oxygen Respiratory system: Diminished air entry at bases, no wheezing or rhonchi, Cardiovascular system: S1-S2 heard, regular rate rhythm, no JVD. Gastrointestinal system: Abdomen is soft nontender bowel sounds heard and normal. Central nervous system: Alert but slightly confused Extremities: No pedal edema Skin: No rashes seen Psychiatry: Mood appears appropriate    Data Reviewed: I have personally reviewed following labs and imaging studies  CBC: Recent Labs  Lab 10/18/19 1525 10/19/19 0932 10/20/19 0430 10/21/19 0951  WBC 3.6* 2.6* 2.7* 2.6*  NEUTROABS 3.2 2.2  --   --   HGB 12.8 12.8 11.0* 12.1  HCT 35.8* 36.9 31.3* 35.7*  MCV 108.8* 111.1* 110.6* 114.1*  PLT 125* 102* 82* 78*    Basic Metabolic Panel: Recent Labs  Lab 10/18/19 1525 10/19/19 0932 10/20/19 0430  NA 134* 136 138  K 3.2* 3.1* 3.5  CL 99 100 101  CO2  23 26 26   GLUCOSE 133* 94 81  BUN 26* 22 15  CREATININE 0.84 0.77 0.61  CALCIUM 8.3* 8.9 8.3*  MG  --  2.2  --     GFR: Estimated Creatinine Clearance: 31.3 mL/min (by C-G formula based on SCr of 0.61 mg/dL).  Liver Function Tests: Recent Labs  Lab 10/18/19 1525 10/19/19 0932  AST 49* 43*  ALT 27 25  ALKPHOS 69 69  BILITOT 0.9 0.9  PROT 6.9 7.1  ALBUMIN 3.1* 3.2*    CBG: No results for input(s): GLUCAP in the last 168 hours.   Recent Results (from the past 240 hour(s))  Urine culture  Status: Abnormal   Collection Time: 10/19/19  9:32 AM   Specimen: Urine, Random  Result Value Ref Range Status   Specimen Description   Final    URINE, RANDOM Performed at [facility], [address] [place], [place] [address]    Special Requests   Final    NONE Performed at [facility], [address] [place], [place] [address]    Culture MULTIPLE SPECIES PRESENT, SUGGEST RECOLLECTION (A)  Final   Report Status 10/20/2019 FINAL  Final  Culture, blood (Routine X 2) w Reflex to ID Panel     Status: None (Preliminary result)   Collection Time: 10/19/19  9:32 AM   Specimen: BLOOD  Result Value Ref Range Status   Specimen Description BLOOD RAC  Final   Special Requests   Final    BOTTLES DRAWN AEROBIC AND ANAEROBIC Blood Culture adequate volume   Culture   Final    NO GROWTH 2 DAYS Performed at [facility], [address] [place], [place] [address]    Report Status PENDING  Incomplete  Culture, blood (Routine X 2) w Reflex to ID Panel     Status: None (Preliminary result)   Collection Time: 10/19/19  9:32 AM   Specimen: BLOOD  Result Value Ref Range Status   Specimen Description BLOOD RIGHT FORE ARM  Final   Special Requests   Final    BOTTLES DRAWN AEROBIC AND ANAEROBIC Blood Culture adequate volume   Culture   Final    NO GROWTH 2 DAYS Performed at [facility], [address] [place], [place] [address]    Report Status  PENDING  Incomplete  Respiratory Panel by RT PCR (Flu A&B, Covid) - Nasopharyngeal Swab     Status: None   Collection Time: 10/19/19  9:32 AM   Specimen: Nasopharyngeal Swab  Result Value Ref Range Status   SARS Coronavirus 2 by RT PCR NEGATIVE NEGATIVE Final    Comment: (NOTE) SARS-CoV-2 target nucleic acids are NOT DETECTED.  The SARS-CoV-2 RNA is generally detectable in upper respiratoy specimens during the acute phase of infection. The lowest concentration of SARS-CoV-2 viral copies this assay can detect is 131 copies/mL. A negative result does not preclude SARS-Cov-2 infection and should not be used as the sole basis for treatment or other patient management decisions. A negative result may occur with  improper specimen collection/handling, submission of specimen other than nasopharyngeal swab, presence of viral mutation(s) within the areas targeted by this assay, and inadequate number of viral copies (<131 copies/mL). A negative result must be combined with clinical observations, patient history, and epidemiological information. The expected result is Negative.  Fact Sheet for Patients:  PinkCheek.be  Fact Sheet for Healthcare Providers:  GravelBags.it  This test is no t yet approved or cleared by the [place] FDA and  has been authorized for detection and/or diagnosis of SARS-CoV-2 by FDA under an Emergency Use Authorization (EUA). This EUA will remain  in effect (meaning this test can be used) for the duration of the COVID-19 declaration under Section 564(b)(1) of the Act, 21 U.S.C. section 360bbb-3(b)(1), unless the authorization is terminated or revoked sooner.     Influenza A by PCR NEGATIVE NEGATIVE Final   Influenza B by PCR NEGATIVE NEGATIVE Final    Comment: (NOTE) The Xpert Xpress SARS-CoV-2/FLU/RSV assay is intended as an aid in  the diagnosis of influenza from Nasopharyngeal swab specimens and   should not be used as a sole basis for  treatment. Nasal washings and  aspirates are unacceptable for Xpert Xpress SARS-CoV-2/FLU/RSV  testing.  Fact Sheet for Patients: PinkCheek.be  Fact Sheet for Healthcare Providers: GravelBags.it  This test is not yet approved or cleared by the [place] FDA and  has been authorized for detection and/or diagnosis of SARS-CoV-2 by  FDA under an Emergency Use Authorization (EUA). This EUA will remain  in effect (meaning this test can be used) for the duration of the  Covid-19 declaration under Section 564(b)(1) of the Act, 21  U.S.C. section 360bbb-3(b)(1), unless the authorization is  terminated or revoked. Performed at [facility], [address] [place], [place] [address]   MRSA PCR Screening     Status: None   Collection Time: 10/20/19 11:17 AM   Specimen: Nasopharyngeal  Result Value Ref Range Status   MRSA by PCR NEGATIVE NEGATIVE Final    Comment:        The GeneXpert MRSA Assay (FDA approved for NASAL specimens only), is one component of a comprehensive MRSA colonization surveillance program. It is not intended to diagnose MRSA infection nor to guide or monitor treatment for MRSA infections. Performed at [facility], [address], [place], [place] [address]          Radiology Studies: DG Chest 2 View  Result Date: 10/21/2019 CLINICAL DATA:  Recent pneumonia.  History of breast carcinoma EXAM: CHEST - 2 VIEW COMPARISON:  October 19, 2019 FINDINGS: There is airspace consolidation in the left lower lobe, consistent with pneumonia there is atelectatic change in the right base. Equivocal pleural effusion on the right. There is cardiomegaly with pulmonary vascularity within normal limits. There is aortic atherosclerosis. Bones are osteoporotic. IMPRESSION: Airspace opacifications consistent with pneumonia left lower lobe. Right base atelectasis  with somewhat equivocal right pleural effusion. Stable cardiac prominence. Bones osteoporotic. Aortic Atherosclerosis (ICD10-I70.0). Electronically Signed   By: Lowella Grip III M.D.   On: 10/21/2019 09:30   CT CHEST W CONTRAST  Result Date: 10/21/2019 CLINICAL DATA:  Cough, dyspnea. History of metastatic breast cancer. EXAM: CT CHEST WITH CONTRAST TECHNIQUE: Multidetector CT imaging of the chest was performed during intravenous contrast administration. CONTRAST:  68mL OMNIPAQUE IOHEXOL 300 MG/ML  SOLN COMPARISON:  July 16, 2019. FINDINGS: Cardiovascular: Atherosclerosis of thoracic aorta is noted without aneurysm or dissection. Mild cardiomegaly is noted. No pericardial effusion is noted. Mediastinum/Nodes: No enlarged mediastinal, hilar, or axillary lymph nodes. Thyroid gland, trachea, and esophagus demonstrate no significant findings. Lungs/Pleura: No pneumothorax is noted. Minimal right pleural effusion is noted. Mild bilateral posterior basilar subsegmental atelectasis or infiltrates are noted. Mild left upper lobe opacity is noted concerning for pneumonia or atelectasis. Upper Abdomen: 1.5 cm rounded hypodensity is noted in posterior portion of right hepatic lobe which appears to be enlarged compared to prior exam, concerning for worsening metastatic lesion. Musculoskeletal: Stable old lower thoracic compression fracture is noted. Stable old upper thoracic compression fracture is noted. Severe thoracic kyphosis is noted. No acute abnormality is noted. IMPRESSION: 1. Mild bilateral posterior basilar subsegmental atelectasis or infiltrates are noted. 2. Mild left upper lobe opacity is noted concerning for pneumonia or atelectasis. 3. Minimal right pleural effusion is noted. 4. 1.5 cm rounded hypodensity is noted in posterior portion of right hepatic lobe which appears to be enlarged compared to prior exam, concerning for worsening metastatic lesion. 5. Stable old lower thoracic and upper thoracic  compression fractures are noted. Severe thoracic kyphosis is noted. 6. Aortic atherosclerosis. Aortic Atherosclerosis (ICD10-I70.0). Electronically Signed  By: Marijo Conception M.D.   On: 10/21/2019 09:26        Scheduled Meds: . azithromycin  500 mg Oral Daily  . multivitamin with minerals  1 tablet Oral Daily   Continuous Infusions: . cefTRIAXone (ROCEPHIN)  IV 2 g (10/21/19 1517)     LOS: 2 days        Hosie Poisson, MD [facility]   To contact the attending provider between 7A-7P or the covering provider during after hours 7P-7A, please log into the web site [email or web address] and access using universal [place] password for that web site. If you do not have the password, please call the hospital operator.  10/21/2019, 3:20 PM

## 2019-10-21 NOTE — Evaluation (Signed)
Clinical/Bedside Swallow Evaluation Patient Details  Name: Kathy Mckay MRN: [id number] Date of Birth: Jun 22, 1932  Today's Date: 10/21/2019 Time: SLP Start Time (ACUTE ONLY): 1250 SLP Stop Time (ACUTE ONLY): 1325 SLP Time Calculation (min) (ACUTE ONLY): 35 min  Past Medical History:  Past Medical History:  Diagnosis Date   Breast cancer ([place])    Cancer ([place])    Developmental delay    GERD (gastroesophageal reflux disease)    Hyperlipidemia    Hypertension    Osteoporosis    Past Surgical History:  Past Surgical History:  Procedure Laterality Date   ABDOMINAL HYSTERECTOMY     BREAST BIOPSY Left 07/19/2017   invasive mammary carcinoma   BREAST BIOPSY Left 07/19/2017   invasive mammary carcinoma   SHOULDER SURGERY     HPI:  Per admitting H&P "HPI: Kathy Mckay is a 86 y.o. female with medical history significant of hypertension, hyperlipidemia, GERD, depression, breast cancer, developmental delay and mental retardation, pernicious anemia, thrombocytopenia, who presents with cough, shortness breath.   Assessment / Plan / Recommendation Clinical Impression  Pt presents with moderate dysphagia with inconsistent overt s/s of aspiration with thin and nectar thick liquids. Pt was able to follow some directions but had difficulty following directions for a thorough oral mech exam. Pt often stated, I'm full and don't want any more but would take a few more bites and sips with encouragement. Given sips of thin, Pt presented with an immediate cough and also delayed coughing for the next few minutes. Cough x1 also after nectar thick liquids. Cough was wet and continued for a few minutes after the swallow. Pt tolerated applesauce well. Oral transit delay given solids with difficulty masticating as Pt does not have any teeth. Rec Dysphagia 2 chopped diet with honey thick liquids. Rec ST provide trials of nectar thick and thin liquids in hopes of upgrading diet as overall health and  breathing improves. SLP Visit Diagnosis: Dysphagia, oropharyngeal phase (R13.12)    Aspiration Risk  Moderate aspiration risk    Diet Recommendation Dysphagia 2 (Fine chop)   Liquid Administration via: Cup Medication Administration: Crushed with puree Supervision: Staff to assist with self feeding;Intermittent supervision to cue for compensatory strategies Compensations: Small sips/bites;Slow rate;Minimize environmental distractions Postural Changes: Seated upright at 90 degrees;Remain upright for at least 30 minutes after po intake    Other  Recommendations Oral Care Recommendations: Oral care BID   Follow up Recommendations   trails of thinner consistencies     Frequency and Duration min 2x/week  1 week       Prognosis Prognosis for Safe Diet Advancement: Good Barriers to Reach Goals: Cognitive deficits      Swallow Study   General Date of Onset: 10/19/19 HPI: Per admitting H&P "HPI: Kathy Mckay is a 86 y.o. female with medical history significant of hypertension, hyperlipidemia, GERD, depression, breast cancer, developmental delay and mental retardation, pernicious anemia, thrombocytopenia, who presents with cough, shortness breath. Type of Study: Bedside Swallow Evaluation Diet Prior to this Study: Regular Temperature Spikes Noted: No Respiratory Status: Nasal cannula History of Recent Intubation: No Behavior/Cognition: Alert;Cooperative;Pleasant mood;Confused Oral Cavity Assessment: Within Functional Limits Oral Care Completed by SLP: No Oral Cavity - Dentition: Edentulous Self-Feeding Abilities: Needs assist;Needs set up Patient Positioning: Upright in bed Baseline Vocal Quality: Normal Volitional Cough: Strong    Oral/Motor/Sensory Function Overall Oral Motor/Sensory Function: Mild impairment Lingual Symmetry: Within Functional Limits Lingual Strength: Within Functional Limits Mandible: Within Functional Limits   Ice Chips Ice chips:  Within functional  limits Presentation: Spoon   Thin Liquid Thin Liquid: Impaired Presentation: Straw;Cup Pharyngeal  Phase Impairments: Cough - Delayed;Cough - Immediate    Nectar Thick Nectar Thick Liquid: Impaired Presentation: Cup Pharyngeal Phase Impairments: Cough - Delayed   Honey Thick Honey Thick Liquid: Not tested   Puree Puree: Within functional limits Presentation: Spoon   Solid     Solid: Impaired Presentation: Self Fed Oral Phase Impairments: Impaired mastication Oral Phase Functional Implications: Prolonged oral transit      [name] 10/21/2019,1:50 PM

## 2019-10-21 NOTE — Progress Notes (Signed)
Initial Nutrition Assessment  DOCUMENTATION CODES:   Not applicable  INTERVENTION:   Magic cup BID with meals, each supplement provides 290 kcal and 9 grams of protein  Mighty Shake Frozen daily on breakfast tray, each supplement provides 220 kcal and 6 grams of protein  MVI with minerals daily   NUTRITION DIAGNOSIS:   Inadequate oral intake related to lethargy/confusion as evidenced by meal completion < 50%.  GOAL:   Patient will meet greater than or equal to 90% of their needs    MONITOR:   Weight trends, Labs, Supplement acceptance, PO intake, Skin  REASON FOR ASSESSMENT:   Malnutrition Screening Tool, Consult Assessment of nutrition requirement/status  ASSESSMENT:   86 year old female with history significant of HTN, HLD, GERD, depression, breast cancer, developmental delay, pernicious anemia, thrombocytopenia presented from group home with cough and shortness of breath. Pt admitted with HCAP.   Patient curled up with covers pulled to her chin, states she is fine this afternoon. Sitter in room. Pt unable to provide nutrition history other than she likes potato chips. Sitter reports pt engaged during feedings, but not eating much. Pt evaluated by SLP, diet downgraded to dysphagia 2 with honey thickened liquids. Will order Magic Cup with lunch and dinner meals as well as Honey Thickened Mighty Shake on breakfast tray.   Meal completion 0-25%  Per chart, weights have trended up ~6 lbs in the last 6 months. Medications reviewed and include: Maxipime  Labs: WBC 2.6 (L)   NUTRITION - FOCUSED PHYSICAL EXAM: Severe orbital and buccal fat depletion; Severe temple muscle depletion   Diet Order:   Diet Order            DIET DYS 2 Room service appropriate? Yes; Fluid consistency: Honey Thick  Diet effective now                 EDUCATION NEEDS:   No education needs have been identified at this time  Skin:  Skin Assessment: Skin Integrity Issues: Skin  Integrity Issues:: Other (Comment) Other: ecchymosis  Last BM:  pta  Height:   Ht Readings from Last 1 Encounters:  10/19/19 4\' 9"  (1.448 m)    Weight:   Wt Readings from Last 1 Encounters:  10/19/19 40.8 kg    BMI:  Body mass index is 19.48 kg/m.  Estimated Nutritional Needs:   Kcal:  1300-1500  Protein:  60-70  Fluid:  > 1.1 L/day   Lajuan Lines, RD, LDN Clinical Nutrition After Hours/Weekend Pager # in [place]

## 2019-10-21 NOTE — Evaluation (Signed)
Physical Therapy Evaluation Patient Details Name: Kathy Mckay MRN: [id number] DOB: 08-08-1932 Today's Date: 10/21/2019   History of Present Illness  Halayna Blane is a 86 year old female with PMH of HTN, hyperlipidemia, depression, breast cancer, mental retardation, pernicious anemia, and thrombocytopenia. She presents to ED with shortness of breath and cough and admitted for healthcare associated pneumonia. She is from a group home and is ward of the state.    Clinical Impression  Patient is supine in bed on arrival to room and is agreeable to PT evaluation. She is able to move her legs supine in bed, however doesn't want to participate in bed mobility. She is able to bring her legs to the side of the bed, but stops halfway and becomes rigid. Tried to move her trunk so she can sit up, but patient doesn't attempt to help even with VCs to push her with her arms. Positioned her back into bed. Pt has full ROM of elbows but minimal grip strength. Pt left in bed with all needs. Pt will benefit from skilled PT services and STR upon discharge to address deficits in strength, balance, and decrease risk for future falls.     Follow Up Recommendations SNF    Equipment Recommendations  None recommended by PT    Recommendations for Other Services       Precautions / Restrictions Precautions Precautions: Fall Restrictions Weight Bearing Restrictions: No      Mobility  Bed Mobility Overal bed mobility: Needs Assistance Bed Mobility: Supine to Sit     Supine to sit: Mod assist     General bed mobility comments: She is able to bring her legs to the side of the bed, but stops halfway and becomes rigid. Tried to move her trunk sitting up, but patient doesn't attempt to help even with VCs. Positioned her back into bed.  Transfers                 General transfer comment: Did not perform due to pts refusal/safety  Ambulation/Gait             General Gait Details: Did not perform  due to safety  Stairs            Wheelchair Mobility    Modified Rankin (Stroke Patients Only)       Balance       Sitting balance - Comments: Did not perform due to safety       Standing balance comment: Did not perform due to safety                             Pertinent Vitals/Pain Pain Assessment: No/denies pain    Home Living Family/patient expects to be discharged to:: Group home                      Prior Function Level of Independence: Independent with assistive device(s)         Comments: According to group home administrator pt walks Mod I with RW throughout the group home. She is indepdent with all bed mobility, but needs assistance with changing clothes and bathing. She cannot read or write and is HOH. Her default answer when she can't here is "I'm alright".     Hand Dominance        Extremity/Trunk Assessment   Upper Extremity Assessment Upper Extremity Assessment: Difficult to assess due to impaired cognition;RUE deficits/detail (Demonstrates full elbow  ROM, but unable to squeeze my finger) RUE Deficits / Details: Patient's R arm is red and bruised.    Lower Extremity Assessment Lower Extremity Assessment: Difficult to assess due to impaired cognition;RLE deficits/detail;LLE deficits/detail RLE Deficits / Details: Pt able to perform SLR supine but unable to hold to test for strength LLE Deficits / Details: Pt able to perform SLR supine but unable to hold to test for strength       Communication   Communication: HOH  Cognition Arousal/Alertness: Lethargic Behavior During Therapy: Flat affect Overall Cognitive Status: History of cognitive impairments - at baseline                                 General Comments: Pt unable to tell me her name and date of birth. She is not oriented to time, date, & month at baseline      General Comments General comments (skin integrity, edema, etc.): Pt's R arm red  and bruised    Exercises General Exercises - Upper Extremity Elbow Flexion: AROM;Both;5 reps;Supine Elbow Extension: AROM;Both;5 reps;Supine General Exercises - Lower Extremity Ankle Circles/Pumps: AAROM;Both;5 reps;Supine Straight Leg Raises: AROM;Both;5 reps;Supine   Assessment/Plan    PT Assessment Patient needs continued PT services  PT Problem List Decreased strength;Decreased range of motion;Decreased activity tolerance;Decreased balance;Decreased mobility;Decreased coordination;Decreased cognition;Decreased safety awareness;Decreased skin integrity       PT Treatment Interventions Gait training;DME instruction;Stair training;Functional mobility training;Therapeutic activities;Therapeutic exercise;Balance training;Neuromuscular re-education    PT Goals (Current goals can be found in the Care Plan section)  Acute Rehab PT Goals Patient Stated Goal: "to go home" PT Goal Formulation: Patient unable to participate in goal setting Time For Goal Achievement: 11/04/19 Potential to Achieve Goals: Poor    Frequency Min 2X/week   Barriers to discharge        Co-evaluation               AM-PAC PT "6 Clicks" Mobility  Outcome Measure Help needed turning from your back to your side while in a flat bed without using bedrails?: A Lot Help needed moving from lying on your back to sitting on the side of a flat bed without using bedrails?: A Lot Help needed moving to and from a bed to a chair (including a wheelchair)?: A Lot Help needed standing up from a chair using your arms (e.g., wheelchair or bedside chair)?: Total Help needed to walk in hospital room?: Total Help needed climbing 3-5 steps with a railing? : Total 6 Click Score: 9    End of Session Equipment Utilized During Treatment: Oxygen Activity Tolerance: Patient limited by lethargy Patient left: in bed;with call bell/phone within reach;with bed alarm set Nurse Communication: Mobility status PT Visit Diagnosis:  Unsteadiness on feet (R26.81);Other abnormalities of gait and mobility (R26.89);Repeated falls (R29.6);Muscle weakness (generalized) (M62.81);History of falling (Z91.81)    Time: 3154-0086 PT Time Calculation (min) (ACUTE ONLY): 11 min   Charges:              Noemi Chapel, SPT Bernita Raisin 10/21/2019, 4:48 PM

## 2019-10-22 DIAGNOSIS — F329 Major depressive disorder, single episode, unspecified: Secondary | ICD-10-CM

## 2019-10-22 DIAGNOSIS — I10 Essential (primary) hypertension: Secondary | ICD-10-CM | POA: Diagnosis not present

## 2019-10-22 DIAGNOSIS — J189 Pneumonia, unspecified organism: Secondary | ICD-10-CM | POA: Diagnosis not present

## 2019-10-22 DIAGNOSIS — Z7189 Other specified counseling: Secondary | ICD-10-CM

## 2019-10-22 DIAGNOSIS — K219 Gastro-esophageal reflux disease without esophagitis: Secondary | ICD-10-CM | POA: Diagnosis not present

## 2019-10-22 DIAGNOSIS — Z515 Encounter for palliative care: Secondary | ICD-10-CM

## 2019-10-22 DIAGNOSIS — E876 Hypokalemia: Secondary | ICD-10-CM | POA: Diagnosis not present

## 2019-10-22 DIAGNOSIS — A419 Sepsis, unspecified organism: Secondary | ICD-10-CM | POA: Diagnosis not present

## 2019-10-22 LAB — BASIC METABOLIC PANEL
Anion gap: 9 (ref 5–15)
BUN: 24 mg/dL — ABNORMAL HIGH (ref 8–23)
CO2: 28 mmol/L (ref 22–32)
Calcium: 8.2 mg/dL — ABNORMAL LOW (ref 8.9–10.3)
Chloride: 105 mmol/L (ref 98–111)
Creatinine, Ser: 0.73 mg/dL (ref 0.44–1.00)
GFR, Estimated: >60 mL/min (ref >60)
Glucose, Bld: 116 mg/dL — ABNORMAL HIGH (ref 70–99)
Potassium: 3.4 mmol/L — ABNORMAL LOW (ref 3.5–5.1)
Sodium: 142 mmol/L (ref 135–145)

## 2019-10-22 MED ORDER — HALOPERIDOL LACTATE 5 MG/ML IJ SOLN: 0.5000 mg | INTRAMUSCULAR | Status: DC | PRN

## 2019-10-22 MED ORDER — ATORVASTATIN CALCIUM 20 MG PO TABS: 40.0000 mg | ORAL_TABLET | Freq: Every day | ORAL | Status: DC

## 2019-10-22 MED ORDER — BIOTENE DRY MOUTH MT LIQD: 15.0000 mL | OROMUCOSAL | Status: DC | PRN

## 2019-10-22 MED ORDER — MENTHOL (TOPICAL ANALGESIC) 4 % EX GEL: 1.0000 "application " | Freq: Four times a day (QID) | CUTANEOUS | Status: DC | PRN

## 2019-10-22 MED ORDER — POTASSIUM CHLORIDE CRYS ER 20 MEQ PO TBCR
40.0000 meq | EXTENDED_RELEASE_TABLET | Freq: Once | ORAL | Status: AC
  Administered 2019-10-22: 40 meq via ORAL
  Filled 2019-10-22: qty 2

## 2019-10-22 MED ORDER — POLYETHYLENE GLYCOL 3350 17 GM/SCOOP PO POWD
17.0000 g | Freq: Every day | ORAL | Status: DC | PRN
  Filled 2019-10-22: qty 255

## 2019-10-22 MED ORDER — MORPHINE SULFATE (PF) 2 MG/ML IV SOLN: 1.0000 mg | INTRAVENOUS | Status: DC | PRN

## 2019-10-22 MED ORDER — MIRTAZAPINE 15 MG PO TABS
45.0000 mg | ORAL_TABLET | Freq: Every day | ORAL | Status: DC
  Administered 2019-10-22: 45 mg via ORAL
  Filled 2019-10-22: qty 3

## 2019-10-22 MED ORDER — POLYVINYL ALCOHOL 1.4 % OP SOLN
1.0000 [drp] | Freq: Four times a day (QID) | OPHTHALMIC | Status: DC | PRN
  Filled 2019-10-22: qty 15

## 2019-10-22 MED ORDER — AMLODIPINE BESYLATE 10 MG PO TABS
10.0000 mg | ORAL_TABLET | Freq: Every day | ORAL | Status: DC
  Filled 2019-10-22: qty 1

## 2019-10-22 MED ORDER — MUSCLE RUB 10-15 % EX CREA
TOPICAL_CREAM | Freq: Four times a day (QID) | CUTANEOUS | Status: DC | PRN
  Filled 2019-10-22: qty 85

## 2019-10-22 MED ORDER — PANTOPRAZOLE SODIUM 40 MG PO TBEC
40.0000 mg | DELAYED_RELEASE_TABLET | Freq: Every day | ORAL | Status: DC
  Filled 2019-10-22: qty 1

## 2019-10-22 MED ORDER — PALBOCICLIB 75 MG PO TABS: 75.0000 mg | ORAL_TABLET | Freq: Every day | ORAL | Status: DC

## 2019-10-22 MED ORDER — MORPHINE SULFATE (CONCENTRATE) 10 MG/0.5ML PO SOLN: 5.0000 mg | ORAL | Status: DC | PRN

## 2019-10-22 MED ORDER — TAMOXIFEN CITRATE 10 MG PO TABS
20.0000 mg | ORAL_TABLET | Freq: Every day | ORAL | Status: DC
  Administered 2019-10-22: 20 mg via ORAL
  Filled 2019-10-22: qty 2

## 2019-10-22 MED ORDER — GLYCOPYRROLATE 0.2 MG/ML IJ SOLN
0.2000 mg | INTRAMUSCULAR | Status: DC | PRN
  Filled 2019-10-22: qty 1

## 2019-10-22 MED FILL — IBRANCE 75 MG TABS: 75 | 28 days supply | Qty: 21 | Fill #1

## 2019-10-22 NOTE — Progress Notes (Signed)
SLP Cancellation Note  Patient Details Name: Kathy Mckay MRN: [id number] DOB: 12-19-1932   Cancelled treatment:       Reason Eval/Treat Not Completed: Fatigue/lethargy limiting ability to participate (chart reviewed; pt sleeping). Upon attempt to see pt, she was sleepy/drowsy and insisted on curling up in bed to rest/sleep. ST services will f/u tomorrow w/ po trials when more awake(hopefully at a meal). Recommend continue current diet and aspiration precautions; oral care.     Orinda Kenner, MS, CCC-SLP Speech Language Pathologist Rehab Services [phone] [facility] 10/22/2019, 3:18 PM

## 2019-10-22 NOTE — Progress Notes (Signed)
PROGRESS NOTE    Kathy Mckay  EHU:[id number] DOB: [birth year] DOA: 10/19/2019 PCP: Burnard Hawthorne, FNP    Chief Complaint  Patient presents with  . Cough    Brief Narrative:   86 year old lady prior history of hypertension, hyperlipidemia, depression breast cancer, mental retardation, pernicious anemia, thrombocytopenia presents to ED with shortness of breath and cough.  Chest x-ray showed bilateral patchy infiltrates left worse than the right.  She was admitted for evaluation and management of healthcare associated pneumonia.  Patient is from group home and is ward of the state.  Pt has baseline mental retardation and is a ward of the state. CT chest with contrast showed Mild bilateral posterior basilar subsegmental atelectasis or infiltrates are noted. Mild left upper lobe opacity is noted concerning for pneumonia or atelectasis. 1.5 cm rounded hypodensity is noted in posterior portion of right hepatic lobe which appears to be enlarged compared to prior exam, concerning for worsening metastatic lesion.  CT abd and pelvis shows multiple hypodense lesions of the liver parenchyma, which are new and enlarged compared to prior examination, consistent with worsened hepatic metastatic disease. Got in touch with the patient's guardian and palliative care consulted for goals of discussion.    Assessment & Plan:   Principal Problem:   HCAP (healthcare-associated pneumonia) Active Problems:   Hypertension   Depression   Gastroesophageal reflux disease   Malignant neoplasm of left breast in female, estrogen receptor positive ([place])   Thrombocytopenia ([place])   Hypokalemia   Sepsis secondary to health care associated pneumonia:  On admission patient was tachypneic with a respiratory rate rate of 25/min, WBC count less than 4 and chest x-ray findings showing bilateral infiltrates consistent with healthcare associated pneumonia. Acute respiratory failure with hypoxia requiring 2 L of nasal  cannula oxygen to keep sats greater than 90% secondary to healthcare associated pneumonia evident with bilateral patchy infiltrates on chest x-ray.. Transition broad spectrum IV antibiotics to IV rocephin as her urine is positive for strep pneumonia antigen.  Resume nasal cannula oxygen to keep sats greater than 90%.  Continue with Robitussin. Blood cultures negative so far.  Urine cultures show multiple bacteria/ species.    Essential hypertension BP parameters are optimal.     Thrombocytopenia Appears to be chronic. No signs of bleeding at this time.    GERD Stable.   Hypokalemia Replaced.   Right hepatic lobe 1.5 cm lesion: CT abd pelvis show multiple hypodense lesions suspicious for metastatic hepatocellular ca.  Palliative care consulted for goals of care.    In view of her age, multiple co morbidities, clinical deterioration, poor oral intake, dysphagia, possible malignancy in the liver, palliative care consulted for goals of care.     DVT prophylaxis: SCDs Code Status: Full code Family Communication: None at bedside  Disposition:   Status is: Inpatient  Remains inpatient appropriate because:IV treatments appropriate due to intensity of illness or inability to take PO   Dispo: The patient is from: Group home              Anticipated d/c is to: SNF              Anticipated d/c date is: 2 days              Patient currently is not medically stable to d/c.       Consultants:   Palliative care.    Procedures: none.    Antimicrobials:  Antibiotics Given (last 72 hours)    Date/Time Action  Medication Dose Rate   10/19/19 2200 New Bag/Given   ceFEPIme (MAXIPIME) 2 g in sodium chloride 0.9 % 100 mL IVPB 2 g 200 mL/hr   10/20/19 1016 New Bag/Given   ceFEPIme (MAXIPIME) 2 g in sodium chloride 0.9 % 100 mL IVPB 2 g 200 mL/hr   10/20/19 1115 New Bag/Given   vancomycin (VANCOREADY) IVPB 500 mg/100 mL 500 mg 100 mL/hr   10/20/19 2057 New Bag/Given    ceFEPIme (MAXIPIME) 2 g in sodium chloride 0.9 % 100 mL IVPB 2 g 200 mL/hr   10/21/19 1016 New Bag/Given   ceFEPIme (MAXIPIME) 2 g in sodium chloride 0.9 % 100 mL IVPB 2 g 200 mL/hr   10/21/19 1243 Given   azithromycin (ZITHROMAX) tablet 500 mg 500 mg    10/21/19 1517 New Bag/Given   cefTRIAXone (ROCEPHIN) 2 g in sodium chloride 0.9 % 100 mL IVPB 2 g 200 mL/hr         Subjective: No new complaints.   Objective: Vitals:   10/21/19 0815 10/21/19 1529 10/21/19 2356 10/22/19 0811  BP: 126/65 122/63 125/67 119/69  Pulse: 71 69 73 70  Resp: 18 18 17 16   Temp: 97.8 F (36.6 C) 97.6 F (36.4 C) 98.8 F (37.1 C) 98.3 F (36.8 C)  TempSrc: Oral Oral Oral Oral  SpO2: 97% 100% 99% 96%  Weight:      Height:        Intake/Output Summary (Last 24 hours) at 10/22/2019 1110 Last data filed at 10/21/2019 1414 Gross per 24 hour  Intake 120 ml  Output --  Net 120 ml   Filed Weights   10/19/19 0929  Weight: 40.8 kg    Examination:  General exam: comfortable, not in distress.  Respiratory system: air entry fair, no tachypnea, on 2l it of [place] oxygen.  Cardiovascular system: S1S2 heard, RRR, no pedal edema.  Gastrointestinal system: Abdomen is soft, non tender , non distended, bowel sounds wnl.  Central nervous system: alert, confused at baseline.  Extremities: No pedal edema, bruised right upper extremity, suspect from the IV site.  Skin: bruising over the right upper extremity.  Psychiatry: cannot be assessed.     Data Reviewed: I have personally reviewed following labs and imaging studies  CBC: Recent Labs  Lab 10/18/19 1525 10/19/19 0932 10/20/19 0430 10/21/19 0951  WBC 3.6* 2.6* 2.7* 2.6*  NEUTROABS 3.2 2.2  --   --   HGB 12.8 12.8 11.0* 12.1  HCT 35.8* 36.9 31.3* 35.7*  MCV 108.8* 111.1* 110.6* 114.1*  PLT 125* 102* 82* 78*    Basic Metabolic Panel: Recent Labs  Lab 10/18/19 1525 10/19/19 0932 10/20/19 0430 10/22/19 0331  NA 134* 136 138 142  K 3.2*  3.1* 3.5 3.4*  CL 99 100 101 105  CO2 23 26 26 28   GLUCOSE 133* 94 81 116*  BUN 26* 22 15 24*  CREATININE 0.84 0.77 0.61 0.73  CALCIUM 8.3* 8.9 8.3* 8.2*  MG  --  2.2  --   --     GFR: Estimated Creatinine Clearance: 31.3 mL/min (by C-G formula based on SCr of 0.73 mg/dL).  Liver Function Tests: Recent Labs  Lab 10/18/19 1525 10/19/19 0932  AST 49* 43*  ALT 27 25  ALKPHOS 69 69  BILITOT 0.9 0.9  PROT 6.9 7.1  ALBUMIN 3.1* 3.2*    CBG: No results for input(s): GLUCAP in the last 168 hours.   Recent Results (from the past 240 hour(s))  Urine culture  Status: Abnormal   Collection Time: 10/19/19  9:32 AM   Specimen: Urine, Random  Result Value Ref Range Status   Specimen Description   Final    URINE, RANDOM Performed at [facility], [address], [place], [place] [address]    Special Requests   Final    NONE Performed at [facility], [address] [place], [place] [address]    Culture MULTIPLE SPECIES PRESENT, SUGGEST RECOLLECTION (A)  Final   Report Status 10/20/2019 FINAL  Final  Culture, blood (Routine X 2) w Reflex to ID Panel     Status: None (Preliminary result)   Collection Time: 10/19/19  9:32 AM   Specimen: BLOOD  Result Value Ref Range Status   Specimen Description BLOOD RAC  Final   Special Requests   Final    BOTTLES DRAWN AEROBIC AND ANAEROBIC Blood Culture adequate volume   Culture   Final    NO GROWTH 3 DAYS Performed at [facility], [address] [place], [place] [address]    Report Status PENDING  Incomplete  Culture, blood (Routine X 2) w Reflex to ID Panel     Status: None (Preliminary result)   Collection Time: 10/19/19  9:32 AM   Specimen: BLOOD  Result Value Ref Range Status   Specimen Description BLOOD RIGHT FORE ARM  Final   Special Requests   Final    BOTTLES DRAWN AEROBIC AND ANAEROBIC Blood Culture adequate volume   Culture   Final    NO GROWTH 3 DAYS Performed at [facility],  [address], [place], [place] [address]    Report Status PENDING  Incomplete  Respiratory Panel by RT PCR (Flu A&B, Covid) - Nasopharyngeal Swab     Status: None   Collection Time: 10/19/19  9:32 AM   Specimen: Nasopharyngeal Swab  Result Value Ref Range Status   SARS Coronavirus 2 by RT PCR NEGATIVE NEGATIVE Final    Comment: (NOTE) SARS-CoV-2 target nucleic acids are NOT DETECTED.  The SARS-CoV-2 RNA is generally detectable in upper respiratoy specimens during the acute phase of infection. The lowest concentration of SARS-CoV-2 viral copies this assay can detect is 131 copies/mL. A negative result does not preclude SARS-Cov-2 infection and should not be used as the sole basis for treatment or other patient management decisions. A negative result may occur with  improper specimen collection/handling, submission of specimen other than nasopharyngeal swab, presence of viral mutation(s) within the areas targeted by this assay, and inadequate number of viral copies (<131 copies/mL). A negative result must be combined with clinical observations, patient history, and epidemiological information. The expected result is Negative.  Fact Sheet for Patients:  PinkCheek.be  Fact Sheet for Healthcare Providers:  GravelBags.it  This test is no t yet approved or cleared by the [place] FDA and  has been authorized for detection and/or diagnosis of SARS-CoV-2 by FDA under an Emergency Use Authorization (EUA). This EUA will remain  in effect (meaning this test can be used) for the duration of the COVID-19 declaration under Section 564(b)(1) of the Act, 21 U.S.C. section 360bbb-3(b)(1), unless the authorization is terminated or revoked sooner.     Influenza A by PCR NEGATIVE NEGATIVE Final   Influenza B by PCR NEGATIVE NEGATIVE Final    Comment: (NOTE) The Xpert Xpress SARS-CoV-2/FLU/RSV assay is intended as an aid in  the  diagnosis of influenza from Nasopharyngeal swab specimens and  should not be used as a sole basis for  treatment. Nasal washings and  aspirates are unacceptable for Xpert Xpress SARS-CoV-2/FLU/RSV  testing.  Fact Sheet for Patients: PinkCheek.be  Fact Sheet for Healthcare Providers: GravelBags.it  This test is not yet approved or cleared by the [place] FDA and  has been authorized for detection and/or diagnosis of SARS-CoV-2 by  FDA under an Emergency Use Authorization (EUA). This EUA will remain  in effect (meaning this test can be used) for the duration of the  Covid-19 declaration under Section 564(b)(1) of the Act, 21  U.S.C. section 360bbb-3(b)(1), unless the authorization is  terminated or revoked. Performed at [facility], [address] [place], [place] [address]   MRSA PCR Screening     Status: None   Collection Time: 10/20/19 11:17 AM   Specimen: Nasopharyngeal  Result Value Ref Range Status   MRSA by PCR NEGATIVE NEGATIVE Final    Comment:        The GeneXpert MRSA Assay (FDA approved for NASAL specimens only), is one component of a comprehensive MRSA colonization surveillance program. It is not intended to diagnose MRSA infection nor to guide or monitor treatment for MRSA infections. Performed at [facility], [address], [place], [place] [address]          Radiology Studies: DG Chest 2 View  Result Date: 10/21/2019 CLINICAL DATA:  Recent pneumonia.  History of breast carcinoma EXAM: CHEST - 2 VIEW COMPARISON:  October 19, 2019 FINDINGS: There is airspace consolidation in the left lower lobe, consistent with pneumonia there is atelectatic change in the right base. Equivocal pleural effusion on the right. There is cardiomegaly with pulmonary vascularity within normal limits. There is aortic atherosclerosis. Bones are osteoporotic. IMPRESSION: Airspace opacifications  consistent with pneumonia left lower lobe. Right base atelectasis with somewhat equivocal right pleural effusion. Stable cardiac prominence. Bones osteoporotic. Aortic Atherosclerosis (ICD10-I70.0). Electronically Signed   By: Lowella Grip III M.D.   On: 10/21/2019 09:30   CT CHEST W CONTRAST  Result Date: 10/21/2019 CLINICAL DATA:  Cough, dyspnea. History of metastatic breast cancer. EXAM: CT CHEST WITH CONTRAST TECHNIQUE: Multidetector CT imaging of the chest was performed during intravenous contrast administration. CONTRAST:  27mL OMNIPAQUE IOHEXOL 300 MG/ML  SOLN COMPARISON:  July 16, 2019. FINDINGS: Cardiovascular: Atherosclerosis of thoracic aorta is noted without aneurysm or dissection. Mild cardiomegaly is noted. No pericardial effusion is noted. Mediastinum/Nodes: No enlarged mediastinal, hilar, or axillary lymph nodes. Thyroid gland, trachea, and esophagus demonstrate no significant findings. Lungs/Pleura: No pneumothorax is noted. Minimal right pleural effusion is noted. Mild bilateral posterior basilar subsegmental atelectasis or infiltrates are noted. Mild left upper lobe opacity is noted concerning for pneumonia or atelectasis. Upper Abdomen: 1.5 cm rounded hypodensity is noted in posterior portion of right hepatic lobe which appears to be enlarged compared to prior exam, concerning for worsening metastatic lesion. Musculoskeletal: Stable old lower thoracic compression fracture is noted. Stable old upper thoracic compression fracture is noted. Severe thoracic kyphosis is noted. No acute abnormality is noted. IMPRESSION: 1. Mild bilateral posterior basilar subsegmental atelectasis or infiltrates are noted. 2. Mild left upper lobe opacity is noted concerning for pneumonia or atelectasis. 3. Minimal right pleural effusion is noted. 4. 1.5 cm rounded hypodensity is noted in posterior portion of right hepatic lobe which appears to be enlarged compared to prior exam, concerning for worsening  metastatic lesion. 5. Stable old lower thoracic and upper thoracic compression fractures are noted. Severe thoracic kyphosis is noted. 6. Aortic atherosclerosis. Aortic Atherosclerosis (ICD10-I70.0). Electronically Signed  By: Marijo Conception M.D.   On: 10/21/2019 09:26   CT ABDOMEN PELVIS W CONTRAST  Result Date: 10/21/2019 CLINICAL DATA:  Metastatic disease evaluation, history of metastatic breast cancer EXAM: CT ABDOMEN AND PELVIS WITH CONTRAST TECHNIQUE: Multidetector CT imaging of the abdomen and pelvis was performed using the standard protocol following bolus administration of intravenous contrast. CONTRAST:  61mL OMNIPAQUE IOHEXOL 300 MG/ML  SOLN COMPARISON:  Same-day CT chest, CT abdomen pelvis, 07/16/2019 FINDINGS: Lower chest: Please see separately reported same day examination of the chest. Hepatobiliary: Multiple hypodense lesions of the liver parenchyma, which are new and enlarged compared to prior examination, the largest in the right lobe of the liver, hepatic segment VI, measuring 1.7 x 1.7 cm, previously 0.8 x 0.7 cm (series 3, image 23). A new lesion of the right lobe of the liver, just anterior to this lesion measures 0.9 x 0.8 cm (series 3, image 22). No gallstones, gallbladder wall thickening, or biliary dilatation. Pancreas: Unremarkable. No pancreatic ductal dilatation or surrounding inflammatory changes. Spleen: Normal in size without significant abnormality. Adrenals/Urinary Tract: Adrenal glands are unremarkable. Kidneys are normal, without renal calculi, solid lesion, or hydronephrosis. Bladder is unremarkable. Stomach/Bowel: Stomach is within normal limits. Appendix is not clearly visualized. No evidence of bowel wall thickening, distention, or inflammatory changes. Sigmoid diverticulosis. Vascular/Lymphatic: No significant vascular findings are present. No enlarged abdominal or pelvic lymph nodes. Reproductive: No mass or other significant abnormality. Other: No abdominal wall  hernia or abnormality. No abdominopelvic ascites. Musculoskeletal: No acute or significant osseous findings. IMPRESSION: 1. Multiple hypodense lesions of the liver parenchyma, which are new and enlarged compared to prior examination, consistent with worsened hepatic metastatic disease. 2. No other evidence of metastatic disease in the abdomen or pelvis. 3. Sigmoid diverticulosis. Electronically Signed   By: Eddie Candle M.D.   On: 10/21/2019 19:55        Scheduled Meds: . amLODipine  10 mg Oral Daily  . atorvastatin  40 mg Oral q1800  . mirtazapine  45 mg Oral QHS  . multivitamin with minerals  1 tablet Oral Daily  . palbociclib  75 mg Oral Daily  . pantoprazole  40 mg Oral Daily  . tamoxifen  20 mg Oral Daily   Continuous Infusions: . sodium chloride 100 mL/hr at 10/21/19 1829  . cefTRIAXone (ROCEPHIN)  IV 2 g (10/21/19 1517)     LOS: 3 days        Hosie Poisson, MD [facility]   To contact the attending provider between 7A-7P or the covering provider during after hours 7P-7A, please log into the web site [email or web address] and access using universal [place] password for that web site. If you do not have the password, please call the hospital operator.  10/22/2019, 11:10 AM

## 2019-10-22 NOTE — TOC Progression Note (Signed)
Transition of Care [facility]) - Progression Note    Patient Details  Name: Kathy Mckay MRN: [id number] Date of Birth: 11/08/1932  [facility] CM/SW Contact  Shelbie Ammons, RN Phone Number: 10/22/2019, 2:57 PM  Clinical Narrative:   RNCM reached to and left message for patient's legal guardian to discuss possible SNF placement.          Expected Discharge Plan and Services                                                 Social Determinants of Health (SDOH) Interventions    Readmission Risk Interventions No flowsheet data found.

## 2019-10-22 NOTE — Care Management Important Message (Signed)
Important Message  Patient Details  Name: Kathy Mckay MRN: [id number] Date of Birth: 1932-10-02   Medicare Important Message Given:  Yes  Reviewed Medicare IM with Elaina Hoops with [place].  Copy faxed to Kailee's attention at [phone].   Dannette Barbara 10/22/2019, 12:31 PM

## 2019-10-22 NOTE — Progress Notes (Signed)
Uneventful night. Pt slept most of the shift without any disturbances. Pt is easily aroused by my voice and pleasantly confused as it is her baseline congniton. Pt states "Im alright" to all questions and remarks made. Patient does not exhibit any signs of distress at this time that requires medical interventions as equal rise and fall of the chest is noted and vital signs are wnl. Pt is lying in bed resting on her left side.

## 2019-10-22 NOTE — Consult Note (Addendum)
Consultation Note Date: 10/22/2019   Patient Name: Kathy Mckay  DOB: 11-08-1932  MRN: [id number]  Age / Sex: 86 y.o., female   PCP: Burnard Hawthorne, FNP Referring Physician: Hosie Poisson, MD   REASON FOR CONSULTATION:Establishing goals of care  Palliative Care consult requested for goals of care discussion in this 86 y.o. female with multiple medical problems including hypertension, hyperlipidemia, GERD, depression, breast cancer, developmental delay and mental retardation, pernicious anemia, and thrombocytopenia. Patient presented to the ED from [facility] with complaints cough and shortness of breath x3 days. Patient is a ward of the state and has a legal guardian. Her oxygen saturation initially on arrival 87% on room air which improved to 94% on 2 L.  PCR negative.  Potassium 3.1.  Chest x-ray showed bilateral basilar infiltrates (less worse than the right).  Patient receiving IV antibiotics.  Clinical Assessment and Goals of Care: I have reviewed medical records including lab results, imaging, Epic notes, and MAR, received report from the bedside RN, and assessed the patient. I with patient's legal guardian Kathy Mckay to discuss diagnosis prognosis, [place], EOL wishes, disposition and options.  Patient is awake, alert with confusion.  Not follow commands.  I introduced Palliative Medicine as specialized medical care for people living with serious illness. It focuses on providing relief from the symptoms and stress of a serious illness. The goal is to improve quality of life for both the patient and the family.  Daughter verbalized understanding and appreciation.  We discussed a brief life review of the patient, along with her functional and nutritional status. Patient has been a resident at [facility] facility since prior to 2012.    We discussed Her current illness and what it means in the larger context of Her on-going co-morbidities. With specific discussions  regarding her sepsis, deconditioning, poor po intake, and overall poor functional and nutritional decline. Natural disease trajectory and expectations at EOL were discussed.  I attempted to elicit values and goals of care important to the patient.    The difference between aggressive medical intervention and comfort care was considered in light of the patient's goals of care.  I explained the importance of goals of care and decision making for Kathy Mckay to her legal guardian. Kathy Mckay verbalized understanding and expressed although she can discuss patient's status any final decisions must come from the director who she plans to share information with.   Discussed in detail patient's full code status and poor prognosis with options of continued aggressive interventions versus allowing care to focus on patient's comfort. DNR/MOST form discussed and advised the need for completion.   Questions and concerns were addressed.  Kathy Mckay (legal guardian) was encouraged to call with questions or concerns. She has my contact and expressed plans to further discuss with director and return call with any final decisions.  PMT will continue to support holistically.  1545: Received call from Director of Legal Guardian Kathy Mckay). Updates provided. She shares they have spoken with family who expresses wishes to allow patient to be comfortable during what time she has left under hospice/Palliative care. Discussed with Kathy Mckay patient's full code status and MOST recommendations to align with expressed wishes for comfort (no artificial feeding, no IV hydration, no antibiotics, comfort care measures) She verbalized understanding and expressed to fax documents to [facility] for signature [phone]).   Education provided on residential hospice vs. Returning to previous facility to receive continued comfort/EOL care. Kathy Mckay states  she will defer those final decisions to [facility] on tomorrow. Confirmed  wishes for comfort, DNR, with plans to follow up with Kathy Mckay on tomorrow for further direction on disposition.   SOCIAL HISTORY:     reports that she has never smoked. She has never used smokeless tobacco. She reports that she does not drink alcohol and does not use drugs.  CODE STATUS: Full code  ADVANCE DIRECTIVES: Primary Decision Maker: Legal Guardian Kathy Mckay (however she advised final decisions require director's approval)    SYMPTOM MANAGEMENT: per attending   Palliative Prophylaxis:   Aspiration, Bowel Regimen, Delirium Protocol, Eye Care, Frequent Pain Assessment and Oral Care  PSYCHO-SOCIAL/SPIRITUAL:  Support System: Family  Desire for further Chaplaincy support:No   Additional Recommendations (Limitations, Scope, Preferences):  Full Scope Treatment  Education on hospice/palliative    PAST MEDICAL HISTORY: Past Medical History:  Diagnosis Date  . Breast cancer ([place])   . Cancer ([place])   . Developmental delay   . GERD (gastroesophageal reflux disease)   . Hyperlipidemia   . Hypertension   . Osteoporosis     ALLERGIES:  has No Known Allergies.   MEDICATIONS:  Current Facility-Administered Medications  Medication Dose Route Frequency Provider Last Rate Last Admin  . 0.9 %  sodium chloride infusion   Intravenous Continuous Hosie Poisson, MD 100 mL/hr at 10/21/19 1829 New Bag at 10/21/19 1829  . acetaminophen (TYLENOL) tablet 650 mg  650 mg Oral Q6H PRN Hosie Poisson, MD      . amLODipine (NORVASC) tablet 10 mg  10 mg Oral Daily Ivor Costa, MD      . atorvastatin (LIPITOR) tablet 40 mg  40 mg Oral q1800 Ivor Costa, MD      . cefTRIAXone (ROCEPHIN) 2 g in sodium chloride 0.9 % 100 mL IVPB  2 g Intravenous Q24H Hosie Poisson, MD 200 mL/hr at 10/21/19 1517 2 g at 10/21/19 1517  . dextromethorphan-guaiFENesin (MUCINEX DM) 30-600 MG per 12 hr tablet 1 tablet  1 tablet Oral BID PRN Hosie Poisson, MD   1 tablet at 10/20/19 0518  . hydrALAZINE (APRESOLINE)  injection 5 mg  5 mg Intravenous Q2H PRN Hosie Poisson, MD      . ipratropium-albuterol (DUONEB) 0.5-2.5 (3) MG/3ML nebulizer solution 3 mL  3 mL Nebulization Q4H PRN Hosie Poisson, MD      . Menthol (Topical Analgesic) 4 % GEL 1 application  1 application Topical I9C PRN Ivor Costa, MD      . mirtazapine (REMERON) tablet 45 mg  45 mg Oral QHS Ivor Costa, MD   45 mg at 10/22/19 7893  . multivitamin with minerals tablet 1 tablet  1 tablet Oral Daily Hosie Poisson, MD   1 tablet at 10/22/19 0937  . ondansetron (ZOFRAN) tablet 4 mg  4 mg Oral Q6H PRN Hosie Poisson, MD      . palbociclib [facility]) tablet 75 mg  75 mg Oral Daily Ivor Costa, MD      . pantoprazole (PROTONIX) EC tablet 40 mg  40 mg Oral Daily Ivor Costa, MD      . polyethylene glycol powder (GLYCOLAX/MIRALAX) container 17 g  17 g Oral Daily PRN Ivor Costa, MD      . tamoxifen (NOLVADEX) tablet 20 mg  20 mg Oral Daily Ivor Costa, MD   20 mg at 10/22/19 0938    VITAL SIGNS: BP 119/69 (BP Location: Right Arm)   Pulse 70   Temp 98.3 F (36.8 C) (Oral)   Resp 16  Ht 4\' 9"  (1.448 m)   Wt 40.8 kg   SpO2 96%   BMI 19.48 kg/m  Filed Weights   10/19/19 0929  Weight: 40.8 kg    Estimated body mass index is 19.48 kg/m as calculated from the following:   Height as of this encounter: 4\' 9"  (1.448 m).   Weight as of this encounter: 40.8 kg.  LABS: CBC:    Component Value Date/Time   WBC 2.6 (L) 10/21/2019 0951   HGB 12.1 10/21/2019 0951   HGB 15.0 02/04/2013 2010   HCT 35.7 (L) 10/21/2019 0951   HCT 45.2 02/04/2013 2010   PLT 78 (L) 10/21/2019 0951   PLT 226 02/04/2013 2010   Comprehensive Metabolic Panel:    Component Value Date/Time   NA 142 10/22/2019 0331   NA 135 (L) 02/04/2013 2010   K 3.4 (L) 10/22/2019 0331   K 3.7 02/04/2013 2010   BUN 24 (H) 10/22/2019 0331   BUN 23 (H) 02/04/2013 2010   CREATININE 0.73 10/22/2019 0331   CREATININE 1.04 02/04/2013 2010   ALBUMIN 3.2 (L) 10/19/2019 0932   ALBUMIN 3.9  02/04/2013 2010     Review of Systems  Unable to perform ROS: Other    Physical Exam General: NAD, chronically-ill appearing Cardiovascular: regular rate and rhythm Pulmonary: clear ant fields Abdomen: soft, nontender, + bowel sounds Extremities: no edema, no joint deformities Skin: right upper arm bruising Neurological: awake, alert, confused ,will not follow commands   Prognosis: Guarded to Poor   Discharge Planning:  To Be Determined  Recommendations: . DNR/DNI-as confirmed by Mrs. Kathy Mckay Civil engineer, contracting of legal guardian). . Continue with current plan of care per medical team  . Legal Guardian planning to discuss patient's care and needs with director and return call with any questions and final decisions. Emphasized the need for ongoing goals of care, address of Code status, MOST form completion. Any and all decisions should focus on patient's quality of life and what is best for her. Prognosis remains guarded -poor with concerns of minimal ability to improve.  . Will discontinue medical interventions not focused on comfort. Kathy Mckay (DSS/Legal Media planner) confirmed wishes for DNR/Transition care to focus on comfort.   Morphine PRN for pain/air hunger/comfort Robinul PRN for excessive secretions Zofran PRN for nausea Liquifilm tears PRN for dry eyes Haldol PRN for agitation/anxiety May have comfort feeding Unrestricted visitations in the setting of EOL (per policy) Oxygen PRN 2L or less for comfort. No escalation.  Marland Kitchen PMT will continue to support and follow. Please call team line with urgent needs.   Palliative Performance Scale: PPS 30%              Legal Guardian (Mckay) expressed understanding and was in agreement with this plan.   Thank you for allowing the Palliative Medicine Team to assist in the care of this patient.  Time In: 1010 Time Out: 1100 Time Total: 50 min.   Visit consisted of counseling and education dealing with the complex  and emotionally intense issues of symptom management and palliative care in the setting of serious and potentially life-threatening illness.Greater than 50%  of this time was spent counseling and coordinating care related to the above assessment and plan.  Signed by:  Alda Lea, AGPCNP-BC Palliative Medicine Team  Phone: [phone] Pager: [phone] Amion: Bjorn Pippin

## 2019-10-23 DIAGNOSIS — A419 Sepsis, unspecified organism: Secondary | ICD-10-CM | POA: Diagnosis not present

## 2019-10-23 DIAGNOSIS — R0902 Hypoxemia: Secondary | ICD-10-CM

## 2019-10-23 DIAGNOSIS — Z66 Do not resuscitate: Secondary | ICD-10-CM

## 2019-10-23 DIAGNOSIS — C50912 Malignant neoplasm of unspecified site of left female breast: Secondary | ICD-10-CM | POA: Diagnosis not present

## 2019-10-23 DIAGNOSIS — J189 Pneumonia, unspecified organism: Secondary | ICD-10-CM | POA: Diagnosis not present

## 2019-10-23 LAB — LEGIONELLA PNEUMOPHILA SEROGP 1 UR AG: L. pneumophila Serogp 1 Ur Ag: NEGATIVE

## 2019-10-23 NOTE — Progress Notes (Signed)
   Daily Progress Note   Patient Name: Kathy Mckay       Date: 10/23/2019 DOB: 09-Jul-1932  Age: 86 y.o. MRN#: [id number] Attending Physician: Kayleen Memos, DO Primary Care Physician: Burnard Hawthorne, FNP Admit Date: 10/19/2019  Reason for Consultation/Follow-up: Establishing goals of care, Non pain symptom management and Pain control  Subjective: Patient somnolent. Will open eyes intermittently. Unable to follow commands. Continues on comfort care measures as requested by Kathy Mckay (legal guardian). MOST form completed and faxed to [facility] [phone]) as requested by [facility]. Pending return of completed document. Appears comfortable. No acute distress.   1440: Received call from Kathy Mckay. Updates provided. Education provided on options for patient's disposition explaining expectations in care needs. Completed MOST form has been faxed back and placed in patient's chart. Legal Guardian reports she will continue conversations with her team and advised once final decision for patient has been determined.   All questions answered.   Length of Stay: 4 days  Vital Signs: BP (!) 141/86 (BP Location: Right Arm)   Pulse 75   Temp 97.7 F (36.5 C) (Oral)   Resp 18   Ht 4\' 9"  (1.448 m)   Wt 40.8 kg   SpO2 94%   BMI 19.48 kg/m  SpO2: SpO2: 94 % O2 Device: O2 Device: Nasal Cannula O2 Flow Rate: O2 Flow Rate (L/min): 2 L/min  Physical Exam: -somnolent, chronically-ill appearing, NAD -normal breathing pattern -will not follow command            Palliative Care Assessment & Plan  HPI: Palliative Care consult requested for goals of care discussion in this 86 y.o. female with multiple medical problems including hypertension, hyperlipidemia, GERD, depression, breast cancer, developmental delay and mental retardation, pernicious anemia, and thrombocytopenia. Patient presented to the ED from [place] with complaints cough and shortness of breath x3 days. Patient is a ward of the  state and has a legal guardian. Her oxygen saturation initially on arrival 87% on room air which improved to 94% on 2 L.  PCR negative.  Potassium 3.1.  Chest x-ray showed bilateral basilar infiltrates (less worse than the right).  Patient receiving IV antibiotics.  Code Status: DNR  Goals of Care/Recommendations: Continue with comfort measures Legal Guardian will return call and provide final decisions on disposition.  PMT will continue to support and follow  Prognosis: POOR (weeks)  Discharge Planning: To Be Determined  Thank you for allowing the Palliative Medicine Team to assist in the care of this patient.  Time Total:35 min.   Visit consisted of counseling and education dealing with the complex and emotionally intense issues of symptom management and palliative care in the setting of serious and potentially life-threatening illness.Greater than 50%  of this time was spent counseling and coordinating care related to the above assessment and plan.  Alda Lea, AGPCNP-BC  Palliative Medicine Team [phone]

## 2019-10-23 NOTE — Discharge Summary (Signed)
Discharge Summary  Kathy Mckay FIE:[id number] DOB: 25-Aug-1932  PCP: Burnard Hawthorne, FNP  Admit date: 10/19/2019 Discharge date: 10/23/2019  Time spent: 35 minutes  Recommendations for Outpatient Follow-up:  1. Continue hospice care  Discharge Diagnoses:  Active Hospital Problems   Diagnosis Date Noted  . HCAP (healthcare-associated pneumonia) 10/19/2019  . Sepsis ([place]) 10/19/2019  . Thrombocytopenia ([place]) 10/19/2019  . Hypokalemia 10/19/2019  . Malignant neoplasm of left breast in female, estrogen receptor positive ([place]) 07/28/2017  . Gastroesophageal reflux disease 04/27/2016  . Depression 03/01/2011  . Hypertension 10/20/2010    Resolved Hospital Problems  No resolved problems to display.    Discharge Condition: Guarded.  Diet recommendation: Pleasure feedings  Vitals:   10/22/19 2318 10/23/19 0730  BP: 116/60 (!) 141/86  Pulse: 78 75  Resp: (!) 22 18  Temp: 98.4 F (36.9 C) 97.7 F (36.5 C)  SpO2: 95% 94%    History of present illness:  86 year old lady prior history of hypertension, hyperlipidemia, depression, breast cancer, mental retardation, pernicious anemia, thrombocytopenia presents to ED with shortness of breath and cough.  Chest x-ray showed bilateral patchy infiltrates left worse than the right.  She was admitted for evaluation and management of healthcare associated pneumonia.  Patient is from group home and is a ward of the state and has a legal guardian.    CT chest with contrast done on 10/21/2019 showed Mild bilateral posterior basilar subsegmental atelectasis or infiltrates are noted. Mild left upper lobe opacity is noted concerning for pneumonia or atelectasis. 1.5 cm rounded hypodensity is noted in posterior portion of right hepatic lobe which appears to be enlarged compared to prior exam, concerning for worsening metastatic lesion.  CT abd and pelvis with contrast done on 10/21/2019 shows multiple hypodense lesions of the liver parenchyma,  which are new and enlarged compared to prior examination, consistent with worsened hepatic metastatic disease. Seen by palliative care team, legal guardianship made decision for comfort care only on 10/22/2019.  10/23/19:  Appears comfortable.  No new complaints.  No acute events overnight.  Hospital Course:  Principal Problem:   HCAP (healthcare-associated pneumonia) Active Problems:   Hypertension   Depression   Gastroesophageal reflux disease   Malignant neoplasm of left breast in female, estrogen receptor positive ([place])   Sepsis ([place])   Thrombocytopenia ([place])   Hypokalemia  Assessment: Sepsis secondary to HCAP, POA Right hepatic lobe lesion measuring 1.5 cm suspicious for metastatic hepatocellular carcinoma Breast cancer on Ibrance and tamoxifen DNR with comfort care. Essential hypertension Severe protein calorie malnutrition Thrombocytopenia, chronic GERD Hypokalemia Failure to thrive in an adult Cognitive deficits  Plan: All goals directed at comfort care only.    Procedures:  None  Consultations:  Palliative care medicine  Discharge Exam: BP (!) 141/86 (BP Location: Right Arm)   Pulse 75   Temp 97.7 F (36.5 C) (Oral)   Resp 18   Ht 4\' 9"  (1.448 m)   Wt 40.8 kg   SpO2 94%   BMI 19.48 kg/m  . General: 86 y.o. year-old female chronically ill-appearing.  Somnolent but easily arousable.  Not in distress.  Appears comfortable. . Cardiovascular: Regular rate and rhythm with no rubs or gallops.  No thyromegaly or JVD noted.   Marland Kitchen Respiratory: Clear to auscultation with no wheezes or rales.  Poor inspiratory effort. . Abdomen: Soft nontender nondistended with normal bowel sounds. . Musculoskeletal: No lower extremity edema bilaterally. Marland Kitchen Psychiatry: Mood is appropriate for condition and setting  Discharge Instructions You were  cared for by a hospitalist during your hospital stay. If you have any questions about your discharge medications or the care you  received while you were in the hospital after you are discharged, you can call the unit and asked to speak with the hospitalist on call if the hospitalist that took care of you is not available. Once you are discharged, your primary care physician will handle any further medical issues. Please note that NO REFILLS for any discharge medications will be authorized once you are discharged, as it is imperative that you return to your primary care physician (or establish a relationship with a primary care physician if you do not have one) for your aftercare needs so that they can reassess your need for medications and monitor your lab values.   Allergies as of 10/23/2019   No Known Allergies     Medication List    STOP taking these medications   amLODipine 10 MG tablet Commonly known as: NORVASC   atorvastatin 40 MG tablet Commonly known as: LIPITOR   esomeprazole 40 MG capsule Commonly known as: NEXIUM   Ibrance 75 MG tablet Generic drug: palbociclib   Menthol (Topical Analgesic) 4 % Gel Commonly known as: Biofreeze   mirtazapine 45 MG tablet Commonly known as: REMERON   oxyCODONE-acetaminophen 5-325 MG tablet Commonly known as: PERCOCET/ROXICET   polyethylene glycol powder 17 GM/SCOOP powder Commonly known as: GLYCOLAX/MIRALAX   prochlorperazine 10 MG tablet Commonly known as: COMPAZINE   tamoxifen 20 MG tablet Commonly known as: NOLVADEX      No Known Allergies  Follow-up Information    Burnard Hawthorne, FNP .   Specialty: Family Medicine Contact information: [address] [place] [address] [place] [address] [phone]                The results of significant diagnostics from this hospitalization (including imaging, microbiology, ancillary and laboratory) are listed below for reference.    Significant Diagnostic Studies: DG Chest 2 View  Result Date: 10/21/2019 CLINICAL DATA:  Recent pneumonia.  History of breast carcinoma EXAM: CHEST - 2 VIEW  COMPARISON:  October 19, 2019 FINDINGS: There is airspace consolidation in the left lower lobe, consistent with pneumonia there is atelectatic change in the right base. Equivocal pleural effusion on the right. There is cardiomegaly with pulmonary vascularity within normal limits. There is aortic atherosclerosis. Bones are osteoporotic. IMPRESSION: Airspace opacifications consistent with pneumonia left lower lobe. Right base atelectasis with somewhat equivocal right pleural effusion. Stable cardiac prominence. Bones osteoporotic. Aortic Atherosclerosis (ICD10-I70.0). Electronically Signed   By: Lowella Grip III M.D.   On: 10/21/2019 09:30   CT CHEST W CONTRAST  Result Date: 10/21/2019 CLINICAL DATA:  Cough, dyspnea. History of metastatic breast cancer. EXAM: CT CHEST WITH CONTRAST TECHNIQUE: Multidetector CT imaging of the chest was performed during intravenous contrast administration. CONTRAST:  59mL OMNIPAQUE IOHEXOL 300 MG/ML  SOLN COMPARISON:  July 16, 2019. FINDINGS: Cardiovascular: Atherosclerosis of thoracic aorta is noted without aneurysm or dissection. Mild cardiomegaly is noted. No pericardial effusion is noted. Mediastinum/Nodes: No enlarged mediastinal, hilar, or axillary lymph nodes. Thyroid gland, trachea, and esophagus demonstrate no significant findings. Lungs/Pleura: No pneumothorax is noted. Minimal right pleural effusion is noted. Mild bilateral posterior basilar subsegmental atelectasis or infiltrates are noted. Mild left upper lobe opacity is noted concerning for pneumonia or atelectasis. Upper Abdomen: 1.5 cm rounded hypodensity is noted in posterior portion of right hepatic lobe which appears to be enlarged compared to prior exam, concerning for worsening  metastatic lesion. Musculoskeletal: Stable old lower thoracic compression fracture is noted. Stable old upper thoracic compression fracture is noted. Severe thoracic kyphosis is noted. No acute abnormality is noted. IMPRESSION: 1. Mild  bilateral posterior basilar subsegmental atelectasis or infiltrates are noted. 2. Mild left upper lobe opacity is noted concerning for pneumonia or atelectasis. 3. Minimal right pleural effusion is noted. 4. 1.5 cm rounded hypodensity is noted in posterior portion of right hepatic lobe which appears to be enlarged compared to prior exam, concerning for worsening metastatic lesion. 5. Stable old lower thoracic and upper thoracic compression fractures are noted. Severe thoracic kyphosis is noted. 6. Aortic atherosclerosis. Aortic Atherosclerosis (ICD10-I70.0). Electronically Signed   By: Marijo Conception M.D.   On: 10/21/2019 09:26   CT ABDOMEN PELVIS W CONTRAST  Result Date: 10/21/2019 CLINICAL DATA:  Metastatic disease evaluation, history of metastatic breast cancer EXAM: CT ABDOMEN AND PELVIS WITH CONTRAST TECHNIQUE: Multidetector CT imaging of the abdomen and pelvis was performed using the standard protocol following bolus administration of intravenous contrast. CONTRAST:  53mL OMNIPAQUE IOHEXOL 300 MG/ML  SOLN COMPARISON:  Same-day CT chest, CT abdomen pelvis, 07/16/2019 FINDINGS: Lower chest: Please see separately reported same day examination of the chest. Hepatobiliary: Multiple hypodense lesions of the liver parenchyma, which are new and enlarged compared to prior examination, the largest in the right lobe of the liver, hepatic segment VI, measuring 1.7 x 1.7 cm, previously 0.8 x 0.7 cm (series 3, image 23). A new lesion of the right lobe of the liver, just anterior to this lesion measures 0.9 x 0.8 cm (series 3, image 22). No gallstones, gallbladder wall thickening, or biliary dilatation. Pancreas: Unremarkable. No pancreatic ductal dilatation or surrounding inflammatory changes. Spleen: Normal in size without significant abnormality. Adrenals/Urinary Tract: Adrenal glands are unremarkable. Kidneys are normal, without renal calculi, solid lesion, or hydronephrosis. Bladder is unremarkable. Stomach/Bowel:  Stomach is within normal limits. Appendix is not clearly visualized. No evidence of bowel wall thickening, distention, or inflammatory changes. Sigmoid diverticulosis. Vascular/Lymphatic: No significant vascular findings are present. No enlarged abdominal or pelvic lymph nodes. Reproductive: No mass or other significant abnormality. Other: No abdominal wall hernia or abnormality. No abdominopelvic ascites. Musculoskeletal: No acute or significant osseous findings. IMPRESSION: 1. Multiple hypodense lesions of the liver parenchyma, which are new and enlarged compared to prior examination, consistent with worsened hepatic metastatic disease. 2. No other evidence of metastatic disease in the abdomen or pelvis. 3. Sigmoid diverticulosis. Electronically Signed   By: Eddie Candle M.D.   On: 10/21/2019 19:55   DG Chest Port 1 View  Result Date: 10/19/2019 CLINICAL DATA:  Cough for several days. EXAM: PORTABLE CHEST 1 VIEW COMPARISON:  February 04, 2013 FINDINGS: The mediastinal contour is stable. The heart size is enlarged. Patchy consolidation of the bilateral lung bases, left greater than right are noted. There is no pleural effusion. The bony structures are stable. IMPRESSION: Patchy consolidation of bilateral lung bases, left greater than right, suspicious for pneumonias. Electronically Signed   By: Abelardo Diesel M.D.   On: 10/19/2019 10:32    Microbiology: Recent Results (from the past 240 hour(s))  Urine culture     Status: Abnormal   Collection Time: 10/19/19  9:32 AM   Specimen: Urine, Random  Result Value Ref Range Status   Specimen Description   Final    URINE, RANDOM Performed at [facility], [address] [place], [place] [address]    Special Requests   Final    NONE  Performed at [facility], [address] [place], [place] [address]    Culture MULTIPLE SPECIES PRESENT, SUGGEST RECOLLECTION (A)  Final   Report Status 10/20/2019 FINAL  Final  Culture, blood (Routine  X 2) w Reflex to ID Panel     Status: None (Preliminary result)   Collection Time: 10/19/19  9:32 AM   Specimen: BLOOD  Result Value Ref Range Status   Specimen Description BLOOD RAC  Final   Special Requests   Final    BOTTLES DRAWN AEROBIC AND ANAEROBIC Blood Culture adequate volume   Culture   Final    NO GROWTH 4 DAYS Performed at [facility], [address] [place], [place] [address]    Report Status PENDING  Incomplete  Culture, blood (Routine X 2) w Reflex to ID Panel     Status: None (Preliminary result)   Collection Time: 10/19/19  9:32 AM   Specimen: BLOOD  Result Value Ref Range Status   Specimen Description BLOOD RIGHT FORE ARM  Final   Special Requests   Final    BOTTLES DRAWN AEROBIC AND ANAEROBIC Blood Culture adequate volume   Culture   Final    NO GROWTH 4 DAYS Performed at [facility], [address], [place], [place] [address]    Report Status PENDING  Incomplete  Respiratory Panel by RT PCR (Flu A&B, Covid) - Nasopharyngeal Swab     Status: None   Collection Time: 10/19/19  9:32 AM   Specimen: Nasopharyngeal Swab  Result Value Ref Range Status   SARS Coronavirus 2 by RT PCR NEGATIVE NEGATIVE Final    Comment: (NOTE) SARS-CoV-2 target nucleic acids are NOT DETECTED.  The SARS-CoV-2 RNA is generally detectable in upper respiratoy specimens during the acute phase of infection. The lowest concentration of SARS-CoV-2 viral copies this assay can detect is 131 copies/mL. A negative result does not preclude SARS-Cov-2 infection and should not be used as the sole basis for treatment or other patient management decisions. A negative result may occur with  improper specimen collection/handling, submission of specimen other than nasopharyngeal swab, presence of viral mutation(s) within the areas targeted by this assay, and inadequate number of viral copies (<131 copies/mL). A negative result must be combined with clinical observations, patient  history, and epidemiological information. The expected result is Negative.  Fact Sheet for Patients:  PinkCheek.be  Fact Sheet for Healthcare Providers:  GravelBags.it  This test is no t yet approved or cleared by the [place] FDA and  has been authorized for detection and/or diagnosis of SARS-CoV-2 by FDA under an Emergency Use Authorization (EUA). This EUA will remain  in effect (meaning this test can be used) for the duration of the COVID-19 declaration under Section 564(b)(1) of the Act, 21 U.S.C. section 360bbb-3(b)(1), unless the authorization is terminated or revoked sooner.     Influenza A by PCR NEGATIVE NEGATIVE Final   Influenza B by PCR NEGATIVE NEGATIVE Final    Comment: (NOTE) The Xpert Xpress SARS-CoV-2/FLU/RSV assay is intended as an aid in  the diagnosis of influenza from Nasopharyngeal swab specimens and  should not be used as a sole basis for treatment. Nasal washings and  aspirates are unacceptable for Xpert Xpress SARS-CoV-2/FLU/RSV  testing.  Fact Sheet for Patients: PinkCheek.be  Fact Sheet for Healthcare Providers: GravelBags.it  This test is not yet approved or cleared by the [place] FDA and  has been authorized for detection and/or diagnosis of SARS-CoV-2 by  FDA under an  Emergency Use Authorization (EUA). This EUA will remain  in effect (meaning this test can be used) for the duration of the  Covid-19 declaration under Section 564(b)(1) of the Act, 21  U.S.C. section 360bbb-3(b)(1), unless the authorization is  terminated or revoked. Performed at [facility], [address] [place], [place] [address]   MRSA PCR Screening     Status: None   Collection Time: 10/20/19 11:17 AM   Specimen: Nasopharyngeal  Result Value Ref Range Status   MRSA by PCR NEGATIVE NEGATIVE Final    Comment:        The GeneXpert MRSA  Assay (FDA approved for NASAL specimens only), is one component of a comprehensive MRSA colonization surveillance program. It is not intended to diagnose MRSA infection nor to guide or monitor treatment for MRSA infections. Performed at [facility], [address] [place], [place] [address]      Labs: Basic Metabolic Panel: Recent Labs  Lab 10/18/19 1525 10/19/19 0932 10/20/19 0430 10/22/19 0331  NA 134* 136 138 142  K 3.2* 3.1* 3.5 3.4*  CL 99 100 101 105  CO2 23 26 26 28   GLUCOSE 133* 94 81 116*  BUN 26* 22 15 24*  CREATININE 0.84 0.77 0.61 0.73  CALCIUM 8.3* 8.9 8.3* 8.2*  MG  --  2.2  --   --    Liver Function Tests: Recent Labs  Lab 10/18/19 1525 10/19/19 0932  AST 49* 43*  ALT 27 25  ALKPHOS 69 69  BILITOT 0.9 0.9  PROT 6.9 7.1  ALBUMIN 3.1* 3.2*   No results for input(s): LIPASE, AMYLASE in the last 168 hours. No results for input(s): AMMONIA in the last 168 hours. CBC: Recent Labs  Lab 10/18/19 1525 10/19/19 0932 10/20/19 0430 10/21/19 0951  WBC 3.6* 2.6* 2.7* 2.6*  NEUTROABS 3.2 2.2  --   --   HGB 12.8 12.8 11.0* 12.1  HCT 35.8* 36.9 31.3* 35.7*  MCV 108.8* 111.1* 110.6* 114.1*  PLT 125* 102* 82* 78*   Cardiac Enzymes: No results for input(s): CKTOTAL, CKMB, CKMBINDEX, TROPONINI in the last 168 hours. BNP: BNP (last 3 results) Recent Labs    10/19/19 0932  BNP 204.4*    ProBNP (last 3 results) No results for input(s): PROBNP in the last 8760 hours.  CBG: No results for input(s): GLUCAP in the last 168 hours.     Signed:  Kayleen Memos, MD [facility] 10/23/2019, 1:15 PM

## 2019-10-23 NOTE — Plan of Care (Signed)
  Problem: Nutrition: Goal: Adequate nutrition will be maintained Outcome: Progressing   

## 2019-10-24 DIAGNOSIS — J189 Pneumonia, unspecified organism: Secondary | ICD-10-CM | POA: Diagnosis not present

## 2019-10-24 LAB — CULTURE, BLOOD (ROUTINE X 2)
Culture: NO GROWTH
Culture: NO GROWTH
Special Requests: ADEQUATE
Special Requests: ADEQUATE

## 2019-10-24 NOTE — Plan of Care (Signed)
  Problem: Clinical Measurements: Goal: Ability to maintain clinical measurements within normal limits will improve Outcome: Progressing   

## 2019-10-24 NOTE — Discharge Summary (Signed)
Discharge Summary  Kathy Mckay CVE:[id number] DOB: 06-08-1932  PCP: Burnard Hawthorne, FNP  Admit date: 10/19/2019 Discharge date: 10/24/2019  Time spent: 35 minutes  Recommendations for Outpatient Follow-up:  1. Continue hospice care  Discharge Diagnoses:  Active Hospital Problems   Diagnosis Date Noted   HCAP (healthcare-associated pneumonia) 10/19/2019   Sepsis ([facility]) 10/19/2019   Thrombocytopenia ([place]) 10/19/2019   Hypokalemia 10/19/2019   Malignant neoplasm of left breast in female, estrogen receptor positive ([place]) 07/28/2017   Gastroesophageal reflux disease 04/27/2016   Depression 03/01/2011   Hypertension 10/20/2010    Resolved Hospital Problems  No resolved problems to display.    Discharge Condition: Guarded.  Diet recommendation: Pleasure feedings  Vitals:   10/23/19 2349 10/24/19 0735  BP: (!) 148/81 135/81  Pulse: 74 73  Resp: 16 16  Temp: 98.3 F (36.8 C) 98.2 F (36.8 C)  SpO2: 90% 93%    History of present illness:  86 year old lady prior history of hypertension, hyperlipidemia, depression, breast cancer, mental retardation, pernicious anemia, thrombocytopenia presents to ED with shortness of breath and cough.  Chest x-ray showed bilateral patchy infiltrates left worse than the right.  She was admitted for evaluation and management of healthcare associated pneumonia.  Patient is from group home and is a ward of the state and has a legal guardian.    CT chest with contrast done on 10/21/2019 showed Mild bilateral posterior basilar subsegmental atelectasis or infiltrates are noted. Mild left upper lobe opacity is noted concerning for pneumonia or atelectasis. 1.5 cm rounded hypodensity is noted in posterior portion of right hepatic lobe which appears to be enlarged compared to prior exam, concerning for worsening metastatic lesion.  CT abd and pelvis with contrast done on 10/21/2019 shows multiple hypodense lesions of the liver parenchyma, which  are new and enlarged compared to prior examination, consistent with worsened hepatic metastatic disease. Seen by palliative care team, legal guardianship made decision for comfort care only on 10/22/2019.  10/24/19:  Appears comfortable.  No new complaints.  No acute events overnight.  Hospital Course:  Principal Problem:   HCAP (healthcare-associated pneumonia) Active Problems:   Hypertension   Depression   Gastroesophageal reflux disease   Malignant neoplasm of left breast in female, estrogen receptor positive ([place])   Sepsis ([place])   Thrombocytopenia ([place])   Hypokalemia  Assessment: Sepsis secondary to HCAP, POA Right hepatic lobe lesion measuring 1.5 cm suspicious for metastatic hepatocellular carcinoma Breast cancer on Ibrance and tamoxifen DNR with comfort care. Essential hypertension Severe protein calorie malnutrition Thrombocytopenia, chronic GERD Hypokalemia Failure to thrive in an adult Cognitive deficits  Plan: All goals directed at comfort care only.    Procedures:  None  Consultations:  Palliative care medicine  Discharge Exam: BP 135/81 (BP Location: Right Arm)    Pulse 73    Temp 98.2 F (36.8 C) (Oral)    Resp 16    Ht 4\' 9"  (1.448 m)    Wt 40.8 kg    SpO2 93%    BMI 19.48 kg/m   General: 86 y.o. year-old female chronically ill-appearing.  Somnolent but easily arousable.  Not in distress.  Appears comfortable.  Cardiovascular: Regular rate and rhythm with no rubs or gallops.  No thyromegaly or JVD noted.    Respiratory: Clear to auscultation with no wheezes or rales.  Poor inspiratory effort.  Abdomen: Soft nontender nondistended with normal bowel sounds.  Musculoskeletal: No lower extremity edema bilaterally.  Psychiatry: Mood is appropriate for condition and setting  Discharge Instructions You were cared for by a hospitalist during your hospital stay. If you have any questions about your discharge medications or the care you received while  you were in the hospital after you are discharged, you can call the unit and asked to speak with the hospitalist on call if the hospitalist that took care of you is not available. Once you are discharged, your primary care physician will handle any further medical issues. Please note that NO REFILLS for any discharge medications will be authorized once you are discharged, as it is imperative that you return to your primary care physician (or establish a relationship with a primary care physician if you do not have one) for your aftercare needs so that they can reassess your need for medications and monitor your lab values.   Allergies as of 10/24/2019   No Known Allergies     Medication List    STOP taking these medications   amLODipine 10 MG tablet Commonly known as: NORVASC   atorvastatin 40 MG tablet Commonly known as: LIPITOR   esomeprazole 40 MG capsule Commonly known as: NEXIUM   Ibrance 75 MG tablet Generic drug: palbociclib   Menthol (Topical Analgesic) 4 % Gel Commonly known as: Biofreeze   mirtazapine 45 MG tablet Commonly known as: REMERON   oxyCODONE-acetaminophen 5-325 MG tablet Commonly known as: PERCOCET/ROXICET   polyethylene glycol powder 17 GM/SCOOP powder Commonly known as: GLYCOLAX/MIRALAX   prochlorperazine 10 MG tablet Commonly known as: COMPAZINE   tamoxifen 20 MG tablet Commonly known as: NOLVADEX      No Known Allergies  Follow-up Information    Burnard Hawthorne, FNP .   Specialty: Family Medicine Contact information: [address] [place] [address] [place] [address] [phone]                The results of significant diagnostics from this hospitalization (including imaging, microbiology, ancillary and laboratory) are listed below for reference.    Significant Diagnostic Studies: DG Chest 2 View  Result Date: 10/21/2019 CLINICAL DATA:  Recent pneumonia.  History of breast carcinoma EXAM: CHEST - 2 VIEW COMPARISON:  October 19, 2019 FINDINGS: There is airspace consolidation in the left lower lobe, consistent with pneumonia there is atelectatic change in the right base. Equivocal pleural effusion on the right. There is cardiomegaly with pulmonary vascularity within normal limits. There is aortic atherosclerosis. Bones are osteoporotic. IMPRESSION: Airspace opacifications consistent with pneumonia left lower lobe. Right base atelectasis with somewhat equivocal right pleural effusion. Stable cardiac prominence. Bones osteoporotic. Aortic Atherosclerosis (ICD10-I70.0). Electronically Signed   By: Lowella Grip III M.D.   On: 10/21/2019 09:30   CT CHEST W CONTRAST  Result Date: 10/21/2019 CLINICAL DATA:  Cough, dyspnea. History of metastatic breast cancer. EXAM: CT CHEST WITH CONTRAST TECHNIQUE: Multidetector CT imaging of the chest was performed during intravenous contrast administration. CONTRAST:  37mL OMNIPAQUE IOHEXOL 300 MG/ML  SOLN COMPARISON:  July 16, 2019. FINDINGS: Cardiovascular: Atherosclerosis of thoracic aorta is noted without aneurysm or dissection. Mild cardiomegaly is noted. No pericardial effusion is noted. Mediastinum/Nodes: No enlarged mediastinal, hilar, or axillary lymph nodes. Thyroid gland, trachea, and esophagus demonstrate no significant findings. Lungs/Pleura: No pneumothorax is noted. Minimal right pleural effusion is noted. Mild bilateral posterior basilar subsegmental atelectasis or infiltrates are noted. Mild left upper lobe opacity is noted concerning for pneumonia or atelectasis. Upper Abdomen: 1.5 cm rounded hypodensity is noted in posterior portion of right hepatic lobe which appears to be enlarged compared to prior  exam, concerning for worsening metastatic lesion. Musculoskeletal: Stable old lower thoracic compression fracture is noted. Stable old upper thoracic compression fracture is noted. Severe thoracic kyphosis is noted. No acute abnormality is noted. IMPRESSION: 1. Mild bilateral posterior  basilar subsegmental atelectasis or infiltrates are noted. 2. Mild left upper lobe opacity is noted concerning for pneumonia or atelectasis. 3. Minimal right pleural effusion is noted. 4. 1.5 cm rounded hypodensity is noted in posterior portion of right hepatic lobe which appears to be enlarged compared to prior exam, concerning for worsening metastatic lesion. 5. Stable old lower thoracic and upper thoracic compression fractures are noted. Severe thoracic kyphosis is noted. 6. Aortic atherosclerosis. Aortic Atherosclerosis (ICD10-I70.0). Electronically Signed   By: Marijo Conception M.D.   On: 10/21/2019 09:26   CT ABDOMEN PELVIS W CONTRAST  Result Date: 10/21/2019 CLINICAL DATA:  Metastatic disease evaluation, history of metastatic breast cancer EXAM: CT ABDOMEN AND PELVIS WITH CONTRAST TECHNIQUE: Multidetector CT imaging of the abdomen and pelvis was performed using the standard protocol following bolus administration of intravenous contrast. CONTRAST:  68mL OMNIPAQUE IOHEXOL 300 MG/ML  SOLN COMPARISON:  Same-day CT chest, CT abdomen pelvis, 07/16/2019 FINDINGS: Lower chest: Please see separately reported same day examination of the chest. Hepatobiliary: Multiple hypodense lesions of the liver parenchyma, which are new and enlarged compared to prior examination, the largest in the right lobe of the liver, hepatic segment VI, measuring 1.7 x 1.7 cm, previously 0.8 x 0.7 cm (series 3, image 23). A new lesion of the right lobe of the liver, just anterior to this lesion measures 0.9 x 0.8 cm (series 3, image 22). No gallstones, gallbladder wall thickening, or biliary dilatation. Pancreas: Unremarkable. No pancreatic ductal dilatation or surrounding inflammatory changes. Spleen: Normal in size without significant abnormality. Adrenals/Urinary Tract: Adrenal glands are unremarkable. Kidneys are normal, without renal calculi, solid lesion, or hydronephrosis. Bladder is unremarkable. Stomach/Bowel: Stomach is within  normal limits. Appendix is not clearly visualized. No evidence of bowel wall thickening, distention, or inflammatory changes. Sigmoid diverticulosis. Vascular/Lymphatic: No significant vascular findings are present. No enlarged abdominal or pelvic lymph nodes. Reproductive: No mass or other significant abnormality. Other: No abdominal wall hernia or abnormality. No abdominopelvic ascites. Musculoskeletal: No acute or significant osseous findings. IMPRESSION: 1. Multiple hypodense lesions of the liver parenchyma, which are new and enlarged compared to prior examination, consistent with worsened hepatic metastatic disease. 2. No other evidence of metastatic disease in the abdomen or pelvis. 3. Sigmoid diverticulosis. Electronically Signed   By: Eddie Candle M.D.   On: 10/21/2019 19:55   DG Chest Port 1 View  Result Date: 10/19/2019 CLINICAL DATA:  Cough for several days. EXAM: PORTABLE CHEST 1 VIEW COMPARISON:  February 04, 2013 FINDINGS: The mediastinal contour is stable. The heart size is enlarged. Patchy consolidation of the bilateral lung bases, left greater than right are noted. There is no pleural effusion. The bony structures are stable. IMPRESSION: Patchy consolidation of bilateral lung bases, left greater than right, suspicious for pneumonias. Electronically Signed   By: Abelardo Diesel M.D.   On: 10/19/2019 10:32    Microbiology: Recent Results (from the past 240 hour(s))  Urine culture     Status: Abnormal   Collection Time: 10/19/19  9:32 AM   Specimen: Urine, Random  Result Value Ref Range Status   Specimen Description   Final    URINE, RANDOM Performed at [facility], [address], [place], [place] [address]    Special Requests   Final  NONE Performed at [facility], [address] [place], [place] [address]    Culture MULTIPLE SPECIES PRESENT, SUGGEST RECOLLECTION (A)  Final   Report Status 10/20/2019 FINAL  Final  Culture, blood (Routine X 2) w Reflex to  ID Panel     Status: None   Collection Time: 10/19/19  9:32 AM   Specimen: BLOOD  Result Value Ref Range Status   Specimen Description BLOOD RAC  Final   Special Requests   Final    BOTTLES DRAWN AEROBIC AND ANAEROBIC Blood Culture adequate volume   Culture   Final    NO GROWTH 5 DAYS Performed at [facility], [address], [place], [place] [address]    Report Status 10/24/2019 FINAL  Final  Culture, blood (Routine X 2) w Reflex to ID Panel     Status: None   Collection Time: 10/19/19  9:32 AM   Specimen: BLOOD  Result Value Ref Range Status   Specimen Description BLOOD RIGHT FORE ARM  Final   Special Requests   Final    BOTTLES DRAWN AEROBIC AND ANAEROBIC Blood Culture adequate volume   Culture   Final    NO GROWTH 5 DAYS Performed at [facility], [address], [place],  [address]    Report Status 10/24/2019 FINAL  Final  Respiratory Panel by RT PCR (Flu A&B, Covid) - Nasopharyngeal Swab     Status: None   Collection Time: 10/19/19  9:32 AM   Specimen: Nasopharyngeal Swab  Result Value Ref Range Status   SARS Coronavirus 2 by RT PCR NEGATIVE NEGATIVE Final    Comment: (NOTE) SARS-CoV-2 target nucleic acids are NOT DETECTED.  The SARS-CoV-2 RNA is generally detectable in upper respiratoy specimens during the acute phase of infection. The lowest concentration of SARS-CoV-2 viral copies this assay can detect is 131 copies/mL. A negative result does not preclude SARS-Cov-2 infection and should not be used as the sole basis for treatment or other patient management decisions. A negative result may occur with  improper specimen collection/handling, submission of specimen other than nasopharyngeal swab, presence of viral mutation(s) within the areas targeted by this assay, and inadequate number of viral copies (<131 copies/mL). A negative result must be combined with clinical observations, patient history, and epidemiological information.  The expected result is Negative.  Fact Sheet for Patients:  PinkCheek.be  Fact Sheet for Healthcare Providers:  GravelBags.it  This test is no t yet approved or cleared by the [place] FDA and  has been authorized for detection and/or diagnosis of SARS-CoV-2 by FDA under an Emergency Use Authorization (EUA). This EUA will remain  in effect (meaning this test can be used) for the duration of the COVID-19 declaration under Section 564(b)(1) of the Act, 21 U.S.C. section 360bbb-3(b)(1), unless the authorization is terminated or revoked sooner.     Influenza A by PCR NEGATIVE NEGATIVE Final   Influenza B by PCR NEGATIVE NEGATIVE Final    Comment: (NOTE) The Xpert Xpress SARS-CoV-2/FLU/RSV assay is intended as an aid in  the diagnosis of influenza from Nasopharyngeal swab specimens and  should not be used as a sole basis for treatment. Nasal washings and  aspirates are unacceptable for Xpert Xpress SARS-CoV-2/FLU/RSV  testing.  Fact Sheet for Patients: PinkCheek.be  Fact Sheet for Healthcare Providers: GravelBags.it  This test is not yet approved or cleared by the [place] FDA and  has been authorized for detection and/or diagnosis of SARS-CoV-2 by  FDA under an Emergency  Use Authorization (EUA). This EUA will remain  in effect (meaning this test can be used) for the duration of the  Covid-19 declaration under Section 564(b)(1) of the Act, 21  U.S.C. section 360bbb-3(b)(1), unless the authorization is  terminated or revoked. Performed at [facility], [address] [place], [place] [address]   MRSA PCR Screening     Status: None   Collection Time: 10/20/19 11:17 AM   Specimen: Nasopharyngeal  Result Value Ref Range Status   MRSA by PCR NEGATIVE NEGATIVE Final    Comment:        The GeneXpert MRSA Assay (FDA approved for NASAL  specimens only), is one component of a comprehensive MRSA colonization surveillance program. It is not intended to diagnose MRSA infection nor to guide or monitor treatment for MRSA infections. Performed at [facility], [address] [place], [place] [address]      Labs: Basic Metabolic Panel: Recent Labs  Lab 10/18/19 1525 10/19/19 0932 10/20/19 0430 10/22/19 0331  NA 134* 136 138 142  K 3.2* 3.1* 3.5 3.4*  CL 99 100 101 105  CO2 23 26 26 28   GLUCOSE 133* 94 81 116*  BUN 26* 22 15 24*  CREATININE 0.84 0.77 0.61 0.73  CALCIUM 8.3* 8.9 8.3* 8.2*  MG  --  2.2  --   --    Liver Function Tests: Recent Labs  Lab 10/18/19 1525 10/19/19 0932  AST 49* 43*  ALT 27 25  ALKPHOS 69 69  BILITOT 0.9 0.9  PROT 6.9 7.1  ALBUMIN 3.1* 3.2*   No results for input(s): LIPASE, AMYLASE in the last 168 hours. No results for input(s): AMMONIA in the last 168 hours. CBC: Recent Labs  Lab 10/18/19 1525 10/19/19 0932 10/20/19 0430 10/21/19 0951  WBC 3.6* 2.6* 2.7* 2.6*  NEUTROABS 3.2 2.2  --   --   HGB 12.8 12.8 11.0* 12.1  HCT 35.8* 36.9 31.3* 35.7*  MCV 108.8* 111.1* 110.6* 114.1*  PLT 125* 102* 82* 78*   Cardiac Enzymes: No results for input(s): CKTOTAL, CKMB, CKMBINDEX, TROPONINI in the last 168 hours. BNP: BNP (last 3 results) Recent Labs    10/19/19 0932  BNP 204.4*    ProBNP (last 3 results) No results for input(s): PROBNP in the last 8760 hours.  CBG: No results for input(s): GLUCAP in the last 168 hours.     Signed:  Kayleen Memos, MD [facility] 10/24/2019, 1:36 PM

## 2019-10-25 ENCOUNTER — Other Ambulatory Visit: Payer: Self-pay | Admitting: Family

## 2019-10-25 DIAGNOSIS — A419 Sepsis, unspecified organism: Secondary | ICD-10-CM | POA: Diagnosis not present

## 2019-10-25 DIAGNOSIS — C50912 Malignant neoplasm of unspecified site of left female breast: Secondary | ICD-10-CM | POA: Diagnosis not present

## 2019-10-25 DIAGNOSIS — J189 Pneumonia, unspecified organism: Secondary | ICD-10-CM | POA: Diagnosis not present

## 2019-10-25 DIAGNOSIS — K219 Gastro-esophageal reflux disease without esophagitis: Secondary | ICD-10-CM | POA: Diagnosis not present

## 2019-10-25 NOTE — Progress Notes (Signed)
   Daily Progress Note   Patient Name: Kathy Mckay       Date: 10/25/2019 DOB: 03/18/1932  Age: 86 y.o. MRN#: [id number] Attending Physician: Kayleen Memos, DO Primary Care Physician: Burnard Hawthorne, FNP Admit Date: 10/19/2019  Patient is lethargic. Will not respond to commands.   Attempted to reach out to patient's Legal Guardian team to further discuss final decisions for disposition. Patient most likely will require hospice home placement. Local facility has no beds available. Multiple attempts to reach [facility] [employer]. Voicemail left.   Length of Stay: 6 days  Vital Signs: BP 133/68 (BP Location: Right Arm)   Pulse 64   Temp 97.9 F (36.6 C) (Oral)   Resp (!) 24   Ht 4\' 9"  (1.448 m)   Wt 40.8 kg   SpO2 96%   BMI 19.48 kg/m  SpO2: SpO2: 96 % O2 Device: O2 Device: Room Air O2 Flow Rate: O2 Flow Rate (L/min): 2 L/min  Physical Exam: Lethargic, chronically-ill appearing Normal breathing pattern             Palliative Care Assessment & Plan    Code Status: DNR  Goals of Care/Recommendations: Continue with full comfort care measures Pending Legal Guardian to return call to myself or CM/CSW with final decisions for placement.   Discharge Planning: To Be Determined  Thank you for allowing the Palliative Medicine Team to assist in the care of this patient.  Time Total:20 min.   Visit consisted of counseling and education dealing with the complex and emotionally intense issues of symptom management and palliative care in the setting of serious and potentially life-threatening illness.Greater than 50%  of this time was spent counseling and coordinating care related to the above assessment and plan.  Alda Lea, AGPCNP-BC  Palliative Medicine Team [phone]

## 2019-10-25 NOTE — TOC Progression Note (Signed)
Transition of Care [facility]) - Progression Note    Patient Details  Name: Kathy Mckay MRN: [id number] Date of Birth: 1932-09-03  Transition of Care [facility]) CM/SW Contact  Shelbie Ammons, RN Phone Number: 10/25/2019, 8:27 AM  Clinical Narrative:   RNCM reached out to Bethanne Ginger legal guardian supervisor at [phone] and left message for return call regarding DSS wishes for patient.          Expected Discharge Plan and Services           Expected Discharge Date: 10/25/19                                     Social Determinants of Health (SDOH) Interventions    Readmission Risk Interventions No flowsheet data found.

## 2019-10-25 NOTE — Care Management Important Message (Signed)
Important Message  Patient Details  Name: Kathy Mckay MRN: [id number] Date of Birth: [birth year]   Medicare Important Message Given:  Yes  DSS guardian Elaina Hoops aware of Medicare IM right.  Copy faxed to her attention on 10/22/19.   Dannette Barbara 10/25/2019, 8:35 AM

## 2019-10-25 NOTE — Discharge Summary (Signed)
Discharge Summary  Kathy Mckay HQP:[id number] DOB: 05/05/1932  PCP: Burnard Hawthorne, FNP  Admit date: 10/19/2019 Discharge date: 10/25/2019  Time spent: 35 minutes  Recommendations for Outpatient Follow-up:  1. Continue hospice care  Discharge Diagnoses:  Active Hospital Problems   Diagnosis Date Noted  . HCAP (healthcare-associated pneumonia) 10/19/2019  . Sepsis ([place]) 10/19/2019  . Thrombocytopenia ([place]) 10/19/2019  . Hypokalemia 10/19/2019  . Malignant neoplasm of left breast in female, estrogen receptor positive ([place]) 07/28/2017  . Gastroesophageal reflux disease 04/27/2016  . Depression 03/01/2011  . Hypertension 10/20/2010    Resolved Hospital Problems  No resolved problems to display.    Discharge Condition: Guarded.  Diet recommendation: Pleasure feedings  Vitals:   10/24/19 2331 10/25/19 0727  BP: 140/77 133/68  Pulse: 72 64  Resp: 15 (!) 24  Temp: 98.7 F (37.1 C) 97.9 F (36.6 C)  SpO2: 95% 96%    History of present illness:  86 year old lady prior history of hypertension, hyperlipidemia, depression, breast cancer, mental retardation, pernicious anemia, thrombocytopenia presents to ED with shortness of breath and cough.  Chest x-ray showed bilateral patchy infiltrates left worse than the right.  She was admitted for evaluation and management of healthcare associated pneumonia.  Patient is from group home and is a ward of the state and has a legal guardian.    CT chest with contrast done on 10/21/2019 showed Mild bilateral posterior basilar subsegmental atelectasis or infiltrates are noted. Mild left upper lobe opacity is noted concerning for pneumonia or atelectasis. 1.5 cm rounded hypodensity is noted in posterior portion of right hepatic lobe which appears to be enlarged compared to prior exam, concerning for worsening metastatic lesion.  CT abd and pelvis with contrast done on 10/21/2019 shows multiple hypodense lesions of the liver parenchyma, which  are new and enlarged compared to prior examination, consistent with worsened hepatic metastatic disease. Seen by palliative care team, legal guardianship made decision for comfort care only on 10/22/2019.  10/25/19:  Appears comfortable.  No new complaints.  No acute events overnight.  Eating breakfast with nursing assistance.  Hospital Course:  Principal Problem:   HCAP (healthcare-associated pneumonia) Active Problems:   Hypertension   Depression   Gastroesophageal reflux disease   Malignant neoplasm of left breast in female, estrogen receptor positive ([place])   Sepsis ([place])   Thrombocytopenia ([place])   Hypokalemia  Assessment: Sepsis secondary to HCAP, POA Right hepatic lobe lesion measuring 1.5 cm suspicious for metastatic hepatocellular carcinoma Breast cancer on Ibrance and tamoxifen DNR with comfort care. Essential hypertension Severe protein calorie malnutrition Thrombocytopenia, chronic GERD Hypokalemia Failure to thrive in an adult Cognitive deficits  Plan: All goals directed at comfort care only.    Procedures:  None  Consultations:  Palliative care medicine  Discharge Exam: BP 133/68 (BP Location: Right Arm)   Pulse 64   Temp 97.9 F (36.6 C) (Oral)   Resp (!) 24   Ht 4\' 9"  (1.448 m)   Wt 40.8 kg   SpO2 96%   BMI 19.48 kg/m  . General: 86 y.o. year-old female chronically ill-appearing.  Alert.  Not in distress.  Appears comfortable. . Cardiovascular: Regular rate and rhythm with no rubs or gallops.  No thyromegaly or JVD noted.   Marland Kitchen Respiratory: Clear to auscultation with no wheezes or rales.  Poor inspiratory effort. . Abdomen: Soft nontender nondistended with normal bowel sounds. . Musculoskeletal: No lower extremity edema bilaterally. Marland Kitchen Psychiatry: Mood is appropriate for condition and setting  Discharge Instructions  You were cared for by a hospitalist during your hospital stay. If you have any questions about your discharge medications or the  care you received while you were in the hospital after you are discharged, you can call the unit and asked to speak with the hospitalist on call if the hospitalist that took care of you is not available. Once you are discharged, your primary care physician will handle any further medical issues. Please note that NO REFILLS for any discharge medications will be authorized once you are discharged, as it is imperative that you return to your primary care physician (or establish a relationship with a primary care physician if you do not have one) for your aftercare needs so that they can reassess your need for medications and monitor your lab values.   Allergies as of 10/25/2019   No Known Allergies     Medication List    STOP taking these medications   amLODipine 10 MG tablet Commonly known as: NORVASC   atorvastatin 40 MG tablet Commonly known as: LIPITOR   esomeprazole 40 MG capsule Commonly known as: NEXIUM   Ibrance 75 MG tablet Generic drug: palbociclib   Menthol (Topical Analgesic) 4 % Gel Commonly known as: Biofreeze   mirtazapine 45 MG tablet Commonly known as: REMERON   oxyCODONE-acetaminophen 5-325 MG tablet Commonly known as: PERCOCET/ROXICET   polyethylene glycol powder 17 GM/SCOOP powder Commonly known as: GLYCOLAX/MIRALAX   prochlorperazine 10 MG tablet Commonly known as: COMPAZINE   tamoxifen 20 MG tablet Commonly known as: NOLVADEX      No Known Allergies  Follow-up Information    Burnard Hawthorne, FNP.   Specialty: Family Medicine Why: October 25 @ 11:30a Contact information: [address] [place] Mans [address] [place] [address] [phone]                The results of significant diagnostics from this hospitalization (including imaging, microbiology, ancillary and laboratory) are listed below for reference.    Significant Diagnostic Studies: DG Chest 2 View  Result Date: 10/21/2019 CLINICAL DATA:  Recent pneumonia.  History of breast  carcinoma EXAM: CHEST - 2 VIEW COMPARISON:  October 19, 2019 FINDINGS: There is airspace consolidation in the left lower lobe, consistent with pneumonia there is atelectatic change in the right base. Equivocal pleural effusion on the right. There is cardiomegaly with pulmonary vascularity within normal limits. There is aortic atherosclerosis. Bones are osteoporotic. IMPRESSION: Airspace opacifications consistent with pneumonia left lower lobe. Right base atelectasis with somewhat equivocal right pleural effusion. Stable cardiac prominence. Bones osteoporotic. Aortic Atherosclerosis (ICD10-I70.0). Electronically Signed   By: Lowella Grip III M.D.   On: 10/21/2019 09:30   CT CHEST W CONTRAST  Result Date: 10/21/2019 CLINICAL DATA:  Cough, dyspnea. History of metastatic breast cancer. EXAM: CT CHEST WITH CONTRAST TECHNIQUE: Multidetector CT imaging of the chest was performed during intravenous contrast administration. CONTRAST:  33mL OMNIPAQUE IOHEXOL 300 MG/ML  SOLN COMPARISON:  July 16, 2019. FINDINGS: Cardiovascular: Atherosclerosis of thoracic aorta is noted without aneurysm or dissection. Mild cardiomegaly is noted. No pericardial effusion is noted. Mediastinum/Nodes: No enlarged mediastinal, hilar, or axillary lymph nodes. Thyroid gland, trachea, and esophagus demonstrate no significant findings. Lungs/Pleura: No pneumothorax is noted. Minimal right pleural effusion is noted. Mild bilateral posterior basilar subsegmental atelectasis or infiltrates are noted. Mild left upper lobe opacity is noted concerning for pneumonia or atelectasis. Upper Abdomen: 1.5 cm rounded hypodensity is noted in posterior portion of right hepatic lobe which appears to be enlarged compared  to prior exam, concerning for worsening metastatic lesion. Musculoskeletal: Stable old lower thoracic compression fracture is noted. Stable old upper thoracic compression fracture is noted. Severe thoracic kyphosis is noted. No acute  abnormality is noted. IMPRESSION: 1. Mild bilateral posterior basilar subsegmental atelectasis or infiltrates are noted. 2. Mild left upper lobe opacity is noted concerning for pneumonia or atelectasis. 3. Minimal right pleural effusion is noted. 4. 1.5 cm rounded hypodensity is noted in posterior portion of right hepatic lobe which appears to be enlarged compared to prior exam, concerning for worsening metastatic lesion. 5. Stable old lower thoracic and upper thoracic compression fractures are noted. Severe thoracic kyphosis is noted. 6. Aortic atherosclerosis. Aortic Atherosclerosis (ICD10-I70.0). Electronically Signed   By: Marijo Conception M.D.   On: 10/21/2019 09:26   CT ABDOMEN PELVIS W CONTRAST  Result Date: 10/21/2019 CLINICAL DATA:  Metastatic disease evaluation, history of metastatic breast cancer EXAM: CT ABDOMEN AND PELVIS WITH CONTRAST TECHNIQUE: Multidetector CT imaging of the abdomen and pelvis was performed using the standard protocol following bolus administration of intravenous contrast. CONTRAST:  58mL OMNIPAQUE IOHEXOL 300 MG/ML  SOLN COMPARISON:  Same-day CT chest, CT abdomen pelvis, 07/16/2019 FINDINGS: Lower chest: Please see separately reported same day examination of the chest. Hepatobiliary: Multiple hypodense lesions of the liver parenchyma, which are new and enlarged compared to prior examination, the largest in the right lobe of the liver, hepatic segment VI, measuring 1.7 x 1.7 cm, previously 0.8 x 0.7 cm (series 3, image 23). A new lesion of the right lobe of the liver, just anterior to this lesion measures 0.9 x 0.8 cm (series 3, image 22). No gallstones, gallbladder wall thickening, or biliary dilatation. Pancreas: Unremarkable. No pancreatic ductal dilatation or surrounding inflammatory changes. Spleen: Normal in size without significant abnormality. Adrenals/Urinary Tract: Adrenal glands are unremarkable. Kidneys are normal, without renal calculi, solid lesion, or  hydronephrosis. Bladder is unremarkable. Stomach/Bowel: Stomach is within normal limits. Appendix is not clearly visualized. No evidence of bowel wall thickening, distention, or inflammatory changes. Sigmoid diverticulosis. Vascular/Lymphatic: No significant vascular findings are present. No enlarged abdominal or pelvic lymph nodes. Reproductive: No mass or other significant abnormality. Other: No abdominal wall hernia or abnormality. No abdominopelvic ascites. Musculoskeletal: No acute or significant osseous findings. IMPRESSION: 1. Multiple hypodense lesions of the liver parenchyma, which are new and enlarged compared to prior examination, consistent with worsened hepatic metastatic disease. 2. No other evidence of metastatic disease in the abdomen or pelvis. 3. Sigmoid diverticulosis. Electronically Signed   By: Eddie Candle M.D.   On: 10/21/2019 19:55   DG Chest Port 1 View  Result Date: 10/19/2019 CLINICAL DATA:  Cough for several days. EXAM: PORTABLE CHEST 1 VIEW COMPARISON:  February 04, 2013 FINDINGS: The mediastinal contour is stable. The heart size is enlarged. Patchy consolidation of the bilateral lung bases, left greater than right are noted. There is no pleural effusion. The bony structures are stable. IMPRESSION: Patchy consolidation of bilateral lung bases, left greater than right, suspicious for pneumonias. Electronically Signed   By: Abelardo Diesel M.D.   On: 10/19/2019 10:32    Microbiology: Recent Results (from the past 240 hour(s))  Urine culture     Status: Abnormal   Collection Time: 10/19/19  9:32 AM   Specimen: Urine, Random  Result Value Ref Range Status   Specimen Description   Final    URINE, RANDOM Performed at [facility], [address], [place], [place] [address]    Special Requests  Final    NONE Performed at [facility], [address] [place], [place] [address]    Culture MULTIPLE SPECIES PRESENT, SUGGEST RECOLLECTION (A)  Final   Report  Status 10/20/2019 FINAL  Final  Culture, blood (Routine X 2) w Reflex to ID Panel     Status: None   Collection Time: 10/19/19  9:32 AM   Specimen: BLOOD  Result Value Ref Range Status   Specimen Description BLOOD RAC  Final   Special Requests   Final    BOTTLES DRAWN AEROBIC AND ANAEROBIC Blood Culture adequate volume   Culture   Final    NO GROWTH 5 DAYS Performed at [facility], [address], [place], [place] [address]    Report Status 10/24/2019 FINAL  Final  Culture, blood (Routine X 2) w Reflex to ID Panel     Status: None   Collection Time: 10/19/19  9:32 AM   Specimen: BLOOD  Result Value Ref Range Status   Specimen Description BLOOD RIGHT FORE ARM  Final   Special Requests   Final    BOTTLES DRAWN AEROBIC AND ANAEROBIC Blood Culture adequate volume   Culture   Final    NO GROWTH 5 DAYS Performed at [facility], [address] [place], [place] [address]    Report Status 10/24/2019 FINAL  Final  Respiratory Panel by RT PCR (Flu A&B, Covid) - Nasopharyngeal Swab     Status: None   Collection Time: 10/19/19  9:32 AM   Specimen: Nasopharyngeal Swab  Result Value Ref Range Status   SARS Coronavirus 2 by RT PCR NEGATIVE NEGATIVE Final    Comment: (NOTE) SARS-CoV-2 target nucleic acids are NOT DETECTED.  The SARS-CoV-2 RNA is generally detectable in upper respiratoy specimens during the acute phase of infection. The lowest concentration of SARS-CoV-2 viral copies this assay can detect is 131 copies/mL. A negative result does not preclude SARS-Cov-2 infection and should not be used as the sole basis for treatment or other patient management decisions. A negative result may occur with  improper specimen collection/handling, submission of specimen other than nasopharyngeal swab, presence of viral mutation(s) within the areas targeted by this assay, and inadequate number of viral copies (<131 copies/mL). A negative result must be combined with  clinical observations, patient history, and epidemiological information. The expected result is Negative.  Fact Sheet for Patients:  PinkCheek.be  Fact Sheet for Healthcare Providers:  GravelBags.it  This test is no t yet approved or cleared by the [place] FDA and  has been authorized for detection and/or diagnosis of SARS-CoV-2 by FDA under an Emergency Use Authorization (EUA). This EUA will remain  in effect (meaning this test can be used) for the duration of the COVID-19 declaration under Section 564(b)(1) of the Act, 21 U.S.C. section 360bbb-3(b)(1), unless the authorization is terminated or revoked sooner.     Influenza A by PCR NEGATIVE NEGATIVE Final   Influenza B by PCR NEGATIVE NEGATIVE Final    Comment: (NOTE) The Xpert Xpress SARS-CoV-2/FLU/RSV assay is intended as an aid in  the diagnosis of influenza from Nasopharyngeal swab specimens and  should not be used as a sole basis for treatment. Nasal washings and  aspirates are unacceptable for Xpert Xpress SARS-CoV-2/FLU/RSV  testing.  Fact Sheet for Patients: PinkCheek.be  Fact Sheet for Healthcare Providers: GravelBags.it  This test is not yet approved or cleared by the [place] FDA and  has been authorized for detection and/or diagnosis of SARS-CoV-2 by  FDA under an Emergency Use Authorization (EUA). This EUA will remain  in effect (meaning this test can be used) for the duration of the  Covid-19 declaration under Section 564(b)(1) of the Act, 21  U.S.C. section 360bbb-3(b)(1), unless the authorization is  terminated or revoked. Performed at [facility], [address] [place], [place] [address]   MRSA PCR Screening     Status: None   Collection Time: 10/20/19 11:17 AM   Specimen: Nasopharyngeal  Result Value Ref Range Status   MRSA by PCR NEGATIVE NEGATIVE Final     Comment:        The GeneXpert MRSA Assay (FDA approved for NASAL specimens only), is one component of a comprehensive MRSA colonization surveillance program. It is not intended to diagnose MRSA infection nor to guide or monitor treatment for MRSA infections. Performed at [facility], [address] [place], [place] [address]      Labs: Basic Metabolic Panel: Recent Labs  Lab 10/18/19 1525 10/19/19 0932 10/20/19 0430 10/22/19 0331  NA 134* 136 138 142  K 3.2* 3.1* 3.5 3.4*  CL 99 100 101 105  CO2 23 26 26 28   GLUCOSE 133* 94 81 116*  BUN 26* 22 15 24*  CREATININE 0.84 0.77 0.61 0.73  CALCIUM 8.3* 8.9 8.3* 8.2*  MG  --  2.2  --   --    Liver Function Tests: Recent Labs  Lab 10/18/19 1525 10/19/19 0932  AST 49* 43*  ALT 27 25  ALKPHOS 69 69  BILITOT 0.9 0.9  PROT 6.9 7.1  ALBUMIN 3.1* 3.2*   No results for input(s): LIPASE, AMYLASE in the last 168 hours. No results for input(s): AMMONIA in the last 168 hours. CBC: Recent Labs  Lab 10/18/19 1525 10/19/19 0932 10/20/19 0430 10/21/19 0951  WBC 3.6* 2.6* 2.7* 2.6*  NEUTROABS 3.2 2.2  --   --   HGB 12.8 12.8 11.0* 12.1  HCT 35.8* 36.9 31.3* 35.7*  MCV 108.8* 111.1* 110.6* 114.1*  PLT 125* 102* 82* 78*   Cardiac Enzymes: No results for input(s): CKTOTAL, CKMB, CKMBINDEX, TROPONINI in the last 168 hours. BNP: BNP (last 3 results) Recent Labs    10/19/19 0932  BNP 204.4*    ProBNP (last 3 results) No results for input(s): PROBNP in the last 8760 hours.  CBG: No results for input(s): GLUCAP in the last 168 hours.     Signed:  Kayleen Memos, MD [facility] 10/25/2019, 1:20 PM

## 2019-10-25 NOTE — Plan of Care (Signed)
Comfort care patient. No acute events overnight. Denies pain. Problem: Education: Goal: Knowledge of General Education information will improve Description: Including pain rating scale, medication(s)/side effects and non-pharmacologic comfort measures Outcome: Completed/Met   Problem: Health Behavior/Discharge Planning: Goal: Ability to manage health-related needs will improve Outcome: Completed/Met   Problem: Clinical Measurements: Goal: Ability to maintain clinical measurements within normal limits will improve Outcome: Completed/Met Goal: Will remain free from infection Outcome: Completed/Met Goal: Diagnostic test results will improve Outcome: Completed/Met Goal: Respiratory complications will improve Outcome: Completed/Met Goal: Cardiovascular complication will be avoided Outcome: Completed/Met   Problem: Activity: Goal: Risk for activity intolerance will decrease Outcome: Completed/Met   Problem: Nutrition: Goal: Adequate nutrition will be maintained Outcome: Completed/Met   Problem: Coping: Goal: Level of anxiety will decrease Outcome: Completed/Met   Problem: Elimination: Goal: Will not experience complications related to bowel motility Outcome: Completed/Met Goal: Will not experience complications related to urinary retention Outcome: Completed/Met   Problem: Pain Managment: Goal: General experience of comfort will improve Outcome: Completed/Met   Problem: Safety: Goal: Ability to remain free from injury will improve Outcome: Completed/Met   Problem: Skin Integrity: Goal: Risk for impaired skin integrity will decrease Outcome: Completed/Met

## 2019-10-26 DIAGNOSIS — J189 Pneumonia, unspecified organism: Secondary | ICD-10-CM | POA: Diagnosis not present

## 2019-10-26 NOTE — Plan of Care (Signed)
  Problem: Pain Managment: Goal: General experience of comfort will improve Outcome: Progressing   Problem: Safety: Goal: Ability to remain free from injury will improve Outcome: Progressing   

## 2019-10-26 NOTE — Discharge Summary (Signed)
Discharge Summary  Kathy Mckay KPT:[id number] DOB: 23-Nov-1932  PCP: Burnard Hawthorne, FNP  Admit date: 10/19/2019 Discharge date: 10/26/2019  Time spent: 35 minutes  Recommendations for Outpatient Follow-up:  1. Continue hospice care  Discharge Diagnoses:  Active Hospital Problems   Diagnosis Date Noted  . HCAP (healthcare-associated pneumonia) 10/19/2019  . Sepsis ([place]) 10/19/2019  . Thrombocytopenia ([place]) 10/19/2019  . Hypokalemia 10/19/2019  . Malignant neoplasm of left breast in female, estrogen receptor positive ([place]) 07/28/2017  . Gastroesophageal reflux disease 04/27/2016  . Depression 03/01/2011  . Hypertension 10/20/2010    Resolved Hospital Problems  No resolved problems to display.    Discharge Condition: Guarded.  Diet recommendation: Pleasure feedings  Vitals:   10/25/19 0727 10/26/19 0829  BP: 133/68 (!) 141/78  Pulse: 64 74  Resp: (!) 24 16  Temp: 97.9 F (36.6 C) (!) 97.5 F (36.4 C)  SpO2: 96% 95%    History of present illness:  86 year old lady prior history of hypertension, hyperlipidemia, depression, breast cancer, mental retardation, pernicious anemia, thrombocytopenia presents to ED with shortness of breath and cough.  Chest x-ray showed bilateral patchy infiltrates left worse than the right.  She was admitted for evaluation and management of healthcare associated pneumonia.  Patient is from group home and is a ward of the state and has a legal guardian.    CT chest with contrast done on 10/21/2019 showed Mild bilateral posterior basilar subsegmental atelectasis or infiltrates are noted. Mild left upper lobe opacity is noted concerning for pneumonia or atelectasis. 1.5 cm rounded hypodensity is noted in posterior portion of right hepatic lobe which appears to be enlarged compared to prior exam, concerning for worsening metastatic lesion.  CT abd and pelvis with contrast done on 10/21/2019 shows multiple hypodense lesions of the liver  parenchyma, which are new and enlarged compared to prior examination, consistent with worsened hepatic metastatic disease. Seen by palliative care team, legal guardianship made decision for comfort care only on 10/22/2019.  10/26/19:  Appears comfortable.  No new complaints.  No acute events overnight.  She is comfortable.  Hospital Course:  Principal Problem:   HCAP (healthcare-associated pneumonia) Active Problems:   Hypertension   Depression   Gastroesophageal reflux disease   Malignant neoplasm of left breast in female, estrogen receptor positive ([place])   Sepsis ([place])   Thrombocytopenia ([place])   Hypokalemia  Assessment: Sepsis secondary to HCAP, POA Right hepatic lobe lesion measuring 1.5 cm suspicious for metastatic hepatocellular carcinoma Breast cancer on Ibrance and tamoxifen DNR with comfort care. Essential hypertension Severe protein calorie malnutrition Thrombocytopenia, chronic GERD Hypokalemia Failure to thrive in an adult Cognitive deficits  Plan: All goals directed at comfort care only.    Procedures:  None  Consultations:  Palliative care medicine  Discharge Exam: Unchanged from prior exam BP (!) 141/78 (BP Location: Left Arm)   Pulse 74   Temp (!) 97.5 F (36.4 C) (Oral)   Resp 16   Ht 4\' 9"  (1.448 m)   Wt 40.8 kg   SpO2 95%   BMI 19.48 kg/m  . General: 86 y.o. year-old female chronically ill-appearing.  Alert.  Not in distress.  Appears comfortable. . Cardiovascular: Regular rate and rhythm with no rubs or gallops.  No thyromegaly or JVD noted.   Marland Kitchen Respiratory: Clear to auscultation with no wheezes or rales.  Poor inspiratory effort. . Abdomen: Soft nontender nondistended with normal bowel sounds. . Musculoskeletal: No lower extremity edema bilaterally. Marland Kitchen Psychiatry: Mood is appropriate for condition  and setting  Discharge Instructions You were cared for by a hospitalist during your hospital stay. If you have any questions about your  discharge medications or the care you received while you were in the hospital after you are discharged, you can call the unit and asked to speak with the hospitalist on call if the hospitalist that took care of you is not available. Once you are discharged, your primary care physician will handle any further medical issues. Please note that NO REFILLS for any discharge medications will be authorized once you are discharged, as it is imperative that you return to your primary care physician (or establish a relationship with a primary care physician if you do not have one) for your aftercare needs so that they can reassess your need for medications and monitor your lab values.   Allergies as of 10/26/2019   No Known Allergies     Medication List    STOP taking these medications   amLODipine 10 MG tablet Commonly known as: NORVASC   atorvastatin 40 MG tablet Commonly known as: LIPITOR   esomeprazole 40 MG capsule Commonly known as: NEXIUM   Ibrance 75 MG tablet Generic drug: palbociclib   Menthol (Topical Analgesic) 4 % Gel Commonly known as: Biofreeze   mirtazapine 45 MG tablet Commonly known as: REMERON   oxyCODONE-acetaminophen 5-325 MG tablet Commonly known as: PERCOCET/ROXICET   polyethylene glycol powder 17 GM/SCOOP powder Commonly known as: GLYCOLAX/MIRALAX   prochlorperazine 10 MG tablet Commonly known as: COMPAZINE   tamoxifen 20 MG tablet Commonly known as: NOLVADEX      No Known Allergies  Follow-up Information    Burnard Hawthorne, FNP.   Specialty: Family Medicine Why: October 25 @ 11:30a Contact information: [address] [place] [address] [place] [address] [phone]                The results of significant diagnostics from this hospitalization (including imaging, microbiology, ancillary and laboratory) are listed below for reference.    Significant Diagnostic Studies: DG Chest 2 View  Result Date: 10/21/2019 CLINICAL DATA:  Recent  pneumonia.  History of breast carcinoma EXAM: CHEST - 2 VIEW COMPARISON:  October 19, 2019 FINDINGS: There is airspace consolidation in the left lower lobe, consistent with pneumonia there is atelectatic change in the right base. Equivocal pleural effusion on the right. There is cardiomegaly with pulmonary vascularity within normal limits. There is aortic atherosclerosis. Bones are osteoporotic. IMPRESSION: Airspace opacifications consistent with pneumonia left lower lobe. Right base atelectasis with somewhat equivocal right pleural effusion. Stable cardiac prominence. Bones osteoporotic. Aortic Atherosclerosis (ICD10-I70.0). Electronically Signed   By: Lowella Grip III M.D.   On: 10/21/2019 09:30   CT CHEST W CONTRAST  Result Date: 10/21/2019 CLINICAL DATA:  Cough, dyspnea. History of metastatic breast cancer. EXAM: CT CHEST WITH CONTRAST TECHNIQUE: Multidetector CT imaging of the chest was performed during intravenous contrast administration. CONTRAST:  24mL OMNIPAQUE IOHEXOL 300 MG/ML  SOLN COMPARISON:  July 16, 2019. FINDINGS: Cardiovascular: Atherosclerosis of thoracic aorta is noted without aneurysm or dissection. Mild cardiomegaly is noted. No pericardial effusion is noted. Mediastinum/Nodes: No enlarged mediastinal, hilar, or axillary lymph nodes. Thyroid gland, trachea, and esophagus demonstrate no significant findings. Lungs/Pleura: No pneumothorax is noted. Minimal right pleural effusion is noted. Mild bilateral posterior basilar subsegmental atelectasis or infiltrates are noted. Mild left upper lobe opacity is noted concerning for pneumonia or atelectasis. Upper Abdomen: 1.5 cm rounded hypodensity is noted in posterior portion of right hepatic lobe which  appears to be enlarged compared to prior exam, concerning for worsening metastatic lesion. Musculoskeletal: Stable old lower thoracic compression fracture is noted. Stable old upper thoracic compression fracture is noted. Severe thoracic kyphosis  is noted. No acute abnormality is noted. IMPRESSION: 1. Mild bilateral posterior basilar subsegmental atelectasis or infiltrates are noted. 2. Mild left upper lobe opacity is noted concerning for pneumonia or atelectasis. 3. Minimal right pleural effusion is noted. 4. 1.5 cm rounded hypodensity is noted in posterior portion of right hepatic lobe which appears to be enlarged compared to prior exam, concerning for worsening metastatic lesion. 5. Stable old lower thoracic and upper thoracic compression fractures are noted. Severe thoracic kyphosis is noted. 6. Aortic atherosclerosis. Aortic Atherosclerosis (ICD10-I70.0). Electronically Signed   By: Marijo Conception M.D.   On: 10/21/2019 09:26   CT ABDOMEN PELVIS W CONTRAST  Result Date: 10/21/2019 CLINICAL DATA:  Metastatic disease evaluation, history of metastatic breast cancer EXAM: CT ABDOMEN AND PELVIS WITH CONTRAST TECHNIQUE: Multidetector CT imaging of the abdomen and pelvis was performed using the standard protocol following bolus administration of intravenous contrast. CONTRAST:  52mL OMNIPAQUE IOHEXOL 300 MG/ML  SOLN COMPARISON:  Same-day CT chest, CT abdomen pelvis, 07/16/2019 FINDINGS: Lower chest: Please see separately reported same day examination of the chest. Hepatobiliary: Multiple hypodense lesions of the liver parenchyma, which are new and enlarged compared to prior examination, the largest in the right lobe of the liver, hepatic segment VI, measuring 1.7 x 1.7 cm, previously 0.8 x 0.7 cm (series 3, image 23). A new lesion of the right lobe of the liver, just anterior to this lesion measures 0.9 x 0.8 cm (series 3, image 22). No gallstones, gallbladder wall thickening, or biliary dilatation. Pancreas: Unremarkable. No pancreatic ductal dilatation or surrounding inflammatory changes. Spleen: Normal in size without significant abnormality. Adrenals/Urinary Tract: Adrenal glands are unremarkable. Kidneys are normal, without renal calculi, solid  lesion, or hydronephrosis. Bladder is unremarkable. Stomach/Bowel: Stomach is within normal limits. Appendix is not clearly visualized. No evidence of bowel wall thickening, distention, or inflammatory changes. Sigmoid diverticulosis. Vascular/Lymphatic: No significant vascular findings are present. No enlarged abdominal or pelvic lymph nodes. Reproductive: No mass or other significant abnormality. Other: No abdominal wall hernia or abnormality. No abdominopelvic ascites. Musculoskeletal: No acute or significant osseous findings. IMPRESSION: 1. Multiple hypodense lesions of the liver parenchyma, which are new and enlarged compared to prior examination, consistent with worsened hepatic metastatic disease. 2. No other evidence of metastatic disease in the abdomen or pelvis. 3. Sigmoid diverticulosis. Electronically Signed   By: Eddie Candle M.D.   On: 10/21/2019 19:55   DG Chest Port 1 View  Result Date: 10/19/2019 CLINICAL DATA:  Cough for several days. EXAM: PORTABLE CHEST 1 VIEW COMPARISON:  February 04, 2013 FINDINGS: The mediastinal contour is stable. The heart size is enlarged. Patchy consolidation of the bilateral lung bases, left greater than right are noted. There is no pleural effusion. The bony structures are stable. IMPRESSION: Patchy consolidation of bilateral lung bases, left greater than right, suspicious for pneumonias. Electronically Signed   By: Abelardo Diesel M.D.   On: 10/19/2019 10:32    Microbiology: Recent Results (from the past 240 hour(s))  Urine culture     Status: Abnormal   Collection Time: 10/19/19  9:32 AM   Specimen: Urine, Random  Result Value Ref Range Status   Specimen Description   Final    URINE, RANDOM Performed at [facility], [address], [place], [place] [address]  Special Requests   Final    NONE Performed at [facility], [address] [place], [place] [address]    Culture MULTIPLE SPECIES PRESENT, SUGGEST RECOLLECTION (A)  Final    Report Status 10/20/2019 FINAL  Final  Culture, blood (Routine X 2) w Reflex to ID Panel     Status: None   Collection Time: 10/19/19  9:32 AM   Specimen: BLOOD  Result Value Ref Range Status   Specimen Description BLOOD RAC  Final   Special Requests   Final    BOTTLES DRAWN AEROBIC AND ANAEROBIC Blood Culture adequate volume   Culture   Final    NO GROWTH 5 DAYS Performed at [name], [address] [place], [place] [address]    Report Status 10/24/2019 FINAL  Final  Culture, blood (Routine X 2) w Reflex to ID Panel     Status: None   Collection Time: 10/19/19  9:32 AM   Specimen: BLOOD  Result Value Ref Range Status   Specimen Description BLOOD RIGHT FORE ARM  Final   Special Requests   Final    BOTTLES DRAWN AEROBIC AND ANAEROBIC Blood Culture adequate volume   Culture   Final    NO GROWTH 5 DAYS Performed at [facility], [address] [name], [place] [address]    Report Status 10/24/2019 FINAL  Final  Respiratory Panel by RT PCR (Flu A&B, Covid) - Nasopharyngeal Swab     Status: None   Collection Time: 10/19/19  9:32 AM   Specimen: Nasopharyngeal Swab  Result Value Ref Range Status   SARS Coronavirus 2 by RT PCR NEGATIVE NEGATIVE Final    Comment: (NOTE) SARS-CoV-2 target nucleic acids are NOT DETECTED.  The SARS-CoV-2 RNA is generally detectable in upper respiratoy specimens during the acute phase of infection. The lowest concentration of SARS-CoV-2 viral copies this assay can detect is 131 copies/mL. A negative result does not preclude SARS-Cov-2 infection and should not be used as the sole basis for treatment or other patient management decisions. A negative result may occur with  improper specimen collection/handling, submission of specimen other than nasopharyngeal swab, presence of viral mutation(s) within the areas targeted by this assay, and inadequate number of viral copies (<131 copies/mL). A negative result must be combined with  clinical observations, patient history, and epidemiological information. The expected result is Negative.  Fact Sheet for Patients:  PinkCheek.be  Fact Sheet for Healthcare Providers:  GravelBags.it  This test is no t yet approved or cleared by the [place] FDA and  has been authorized for detection and/or diagnosis of SARS-CoV-2 by FDA under an Emergency Use Authorization (EUA). This EUA will remain  in effect (meaning this test can be used) for the duration of the COVID-19 declaration under Section 564(b)(1) of the Act, 21 U.S.C. section 360bbb-3(b)(1), unless the authorization is terminated or revoked sooner.     Influenza A by PCR NEGATIVE NEGATIVE Final   Influenza B by PCR NEGATIVE NEGATIVE Final    Comment: (NOTE) The Xpert Xpress SARS-CoV-2/FLU/RSV assay is intended as an aid in  the diagnosis of influenza from Nasopharyngeal swab specimens and  should not be used as a sole basis for treatment. Nasal washings and  aspirates are unacceptable for Xpert Xpress SARS-CoV-2/FLU/RSV  testing.  Fact Sheet for Patients: PinkCheek.be  Fact Sheet for Healthcare Providers: GravelBags.it  This test is not yet approved or cleared by the [place] FDA and  has been authorized for detection and/or diagnosis  of SARS-CoV-2 by  FDA under an Emergency Use Authorization (EUA). This EUA will remain  in effect (meaning this test can be used) for the duration of the  Covid-19 declaration under Section 564(b)(1) of the Act, 21  U.S.C. section 360bbb-3(b)(1), unless the authorization is  terminated or revoked. Performed at [facility], [address] [place], [place] [address]   MRSA PCR Screening     Status: None   Collection Time: 10/20/19 11:17 AM   Specimen: Nasopharyngeal  Result Value Ref Range Status   MRSA by PCR NEGATIVE NEGATIVE Final     Comment:        The GeneXpert MRSA Assay (FDA approved for NASAL specimens only), is one component of a comprehensive MRSA colonization surveillance program. It is not intended to diagnose MRSA infection nor to guide or monitor treatment for MRSA infections. Performed at [facility], [address] [place], [place] [address]      Labs: Basic Metabolic Panel: Recent Labs  Lab 10/20/19 0430 10/22/19 0331  NA 138 142  K 3.5 3.4*  CL 101 105  CO2 26 28  GLUCOSE 81 116*  BUN 15 24*  CREATININE 0.61 0.73  CALCIUM 8.3* 8.2*   Liver Function Tests: No results for input(s): AST, ALT, ALKPHOS, BILITOT, PROT, ALBUMIN in the last 168 hours. No results for input(s): LIPASE, AMYLASE in the last 168 hours. No results for input(s): AMMONIA in the last 168 hours. CBC: Recent Labs  Lab 10/20/19 0430 10/21/19 0951  WBC 2.7* 2.6*  HGB 11.0* 12.1  HCT 31.3* 35.7*  MCV 110.6* 114.1*  PLT 82* 78*   Cardiac Enzymes: No results for input(s): CKTOTAL, CKMB, CKMBINDEX, TROPONINI in the last 168 hours. BNP: BNP (last 3 results) Recent Labs    10/19/19 0932  BNP 204.4*    ProBNP (last 3 results) No results for input(s): PROBNP in the last 8760 hours.  CBG: No results for input(s): GLUCAP in the last 168 hours.     Signed:  Kayleen Memos, MD [facility] 10/26/2019, 1:52 PM

## 2019-10-27 DIAGNOSIS — J189 Pneumonia, unspecified organism: Secondary | ICD-10-CM | POA: Diagnosis not present

## 2019-10-27 NOTE — Progress Notes (Signed)
Discharge Summary  Kathy Mckay LKG:[id number] DOB: [birth year]  PCP: Burnard Hawthorne, FNP  Admit date: 10/19/2019 Discharge date: 10/27/2019  Time spent: 35 minutes  Recommendations for Outpatient Follow-up:  1. Continue hospice care  Discharge Diagnoses:  Active Hospital Problems   Diagnosis Date Noted  . HCAP (healthcare-associated pneumonia) 10/19/2019  . Sepsis ([place]) 10/19/2019  . Thrombocytopenia ([place]) 10/19/2019  . Hypokalemia 10/19/2019  . Malignant neoplasm of left breast in female, estrogen receptor positive ([place]) 07/28/2017  . Gastroesophageal reflux disease 04/27/2016  . Depression 03/01/2011  . Hypertension 10/20/2010    Resolved Hospital Problems  No resolved problems to display.    Discharge Condition: Guarded.  Diet recommendation: Pleasure feedings  Vitals:   10/26/19 2012 10/27/19 0735  BP: (!) 129/94 (!) 148/78  Pulse: 81 84  Resp: 16 17  Temp: 98.7 F (37.1 C) 98.2 F (36.8 C)  SpO2: 94% 92%    History of present illness:  86 year old lady prior history of hypertension, hyperlipidemia, depression, breast cancer, mental retardation, pernicious anemia, thrombocytopenia presents to ED with shortness of breath and cough.  Chest x-ray showed bilateral patchy infiltrates left worse than the right.  She was admitted for evaluation and management of healthcare associated pneumonia.  Patient is from group home and is a ward of the state and has a legal guardian.    CT chest with contrast done on 10/21/2019 showed Mild bilateral posterior basilar subsegmental atelectasis or infiltrates are noted. Mild left upper lobe opacity is noted concerning for pneumonia or atelectasis. 1.5 cm rounded hypodensity is noted in posterior portion of right hepatic lobe which appears to be enlarged compared to prior exam, concerning for worsening metastatic lesion.  CT abd and pelvis with contrast done on 10/21/2019 shows multiple hypodense lesions of the liver parenchyma,  which are new and enlarged compared to prior examination, consistent with worsened hepatic metastatic disease. Seen by palliative care team, legal guardianship made decision for comfort care only on 10/22/2019.  10/27/19:  Appears comfortable.  No acute events overnight.  No new complaints.  Awaiting disposition.  Hospital Course:  Principal Problem:   HCAP (healthcare-associated pneumonia) Active Problems:   Hypertension   Depression   Gastroesophageal reflux disease   Malignant neoplasm of left breast in female, estrogen receptor positive ([place])   Sepsis ([place])   Thrombocytopenia ([place])   Hypokalemia  Assessment: Sepsis secondary to HCAP, POA Right hepatic lobe lesion measuring 1.5 cm suspicious for metastatic hepatocellular carcinoma Breast cancer on Ibrance and tamoxifen DNR with comfort care. Essential hypertension Severe protein calorie malnutrition Thrombocytopenia, chronic GERD Hypokalemia Failure to thrive in an adult Cognitive deficits  Plan: All goals directed at comfort care only.    Procedures:  None  Consultations:  Palliative care medicine  Discharge Exam: Unchanged from prior exam BP (!) 148/78 (BP Location: Left Arm)   Pulse 84   Temp 98.2 F (36.8 C)   Resp 17   Ht 4\' 9"  (1.448 m)   Wt 40.8 kg   SpO2 92%   BMI 19.48 kg/m  . General: 86 y.o. year-old female chronically ill-appearing.  Alert.  Not in distress.  Appears comfortable. . Cardiovascular: Regular rate and rhythm with no rubs or gallops.  No thyromegaly or JVD noted.   Marland Kitchen Respiratory: Clear to auscultation with no wheezes or rales.  Poor inspiratory effort. . Abdomen: Soft nontender nondistended with normal bowel sounds. . Musculoskeletal: No lower extremity edema bilaterally. Marland Kitchen Psychiatry: Mood is appropriate for condition and setting  Discharge  Instructions You were cared for by a hospitalist during your hospital stay. If you have any questions about your discharge medications or  the care you received while you were in the hospital after you are discharged, you can call the unit and asked to speak with the hospitalist on call if the hospitalist that took care of you is not available. Once you are discharged, your primary care physician will handle any further medical issues. Please note that NO REFILLS for any discharge medications will be authorized once you are discharged, as it is imperative that you return to your primary care physician (or establish a relationship with a primary care physician if you do not have one) for your aftercare needs so that they can reassess your need for medications and monitor your lab values.   Allergies as of 10/27/2019   No Known Allergies     Medication List    STOP taking these medications   amLODipine 10 MG tablet Commonly known as: NORVASC   atorvastatin 40 MG tablet Commonly known as: LIPITOR   esomeprazole 40 MG capsule Commonly known as: NEXIUM   Ibrance 75 MG tablet Generic drug: palbociclib   Menthol (Topical Analgesic) 4 % Gel Commonly known as: Biofreeze   mirtazapine 45 MG tablet Commonly known as: REMERON   oxyCODONE-acetaminophen 5-325 MG tablet Commonly known as: PERCOCET/ROXICET   polyethylene glycol powder 17 GM/SCOOP powder Commonly known as: GLYCOLAX/MIRALAX   prochlorperazine 10 MG tablet Commonly known as: COMPAZINE   tamoxifen 20 MG tablet Commonly known as: NOLVADEX      No Known Allergies  Follow-up Information    Burnard Hawthorne, FNP.   Specialty: Family Medicine Why: October 25 @ 11:30a Contact information: [address] [place] Mans [address] [place] [address] [phone]                The results of significant diagnostics from this hospitalization (including imaging, microbiology, ancillary and laboratory) are listed below for reference.    Significant Diagnostic Studies: DG Chest 2 View  Result Date: 10/21/2019 CLINICAL DATA:  Recent pneumonia.  History of breast  carcinoma EXAM: CHEST - 2 VIEW COMPARISON:  October 19, 2019 FINDINGS: There is airspace consolidation in the left lower lobe, consistent with pneumonia there is atelectatic change in the right base. Equivocal pleural effusion on the right. There is cardiomegaly with pulmonary vascularity within normal limits. There is aortic atherosclerosis. Bones are osteoporotic. IMPRESSION: Airspace opacifications consistent with pneumonia left lower lobe. Right base atelectasis with somewhat equivocal right pleural effusion. Stable cardiac prominence. Bones osteoporotic. Aortic Atherosclerosis (ICD10-I70.0). Electronically Signed   By: Lowella Grip III M.D.   On: 10/21/2019 09:30   CT CHEST W CONTRAST  Result Date: 10/21/2019 CLINICAL DATA:  Cough, dyspnea. History of metastatic breast cancer. EXAM: CT CHEST WITH CONTRAST TECHNIQUE: Multidetector CT imaging of the chest was performed during intravenous contrast administration. CONTRAST:  84mL OMNIPAQUE IOHEXOL 300 MG/ML  SOLN COMPARISON:  July 16, 2019. FINDINGS: Cardiovascular: Atherosclerosis of thoracic aorta is noted without aneurysm or dissection. Mild cardiomegaly is noted. No pericardial effusion is noted. Mediastinum/Nodes: No enlarged mediastinal, hilar, or axillary lymph nodes. Thyroid gland, trachea, and esophagus demonstrate no significant findings. Lungs/Pleura: No pneumothorax is noted. Minimal right pleural effusion is noted. Mild bilateral posterior basilar subsegmental atelectasis or infiltrates are noted. Mild left upper lobe opacity is noted concerning for pneumonia or atelectasis. Upper Abdomen: 1.5 cm rounded hypodensity is noted in posterior portion of right hepatic lobe which appears to be enlarged  compared to prior exam, concerning for worsening metastatic lesion. Musculoskeletal: Stable old lower thoracic compression fracture is noted. Stable old upper thoracic compression fracture is noted. Severe thoracic kyphosis is noted. No acute  abnormality is noted. IMPRESSION: 1. Mild bilateral posterior basilar subsegmental atelectasis or infiltrates are noted. 2. Mild left upper lobe opacity is noted concerning for pneumonia or atelectasis. 3. Minimal right pleural effusion is noted. 4. 1.5 cm rounded hypodensity is noted in posterior portion of right hepatic lobe which appears to be enlarged compared to prior exam, concerning for worsening metastatic lesion. 5. Stable old lower thoracic and upper thoracic compression fractures are noted. Severe thoracic kyphosis is noted. 6. Aortic atherosclerosis. Aortic Atherosclerosis (ICD10-I70.0). Electronically Signed   By: Marijo Conception M.D.   On: 10/21/2019 09:26   CT ABDOMEN PELVIS W CONTRAST  Result Date: 10/21/2019 CLINICAL DATA:  Metastatic disease evaluation, history of metastatic breast cancer EXAM: CT ABDOMEN AND PELVIS WITH CONTRAST TECHNIQUE: Multidetector CT imaging of the abdomen and pelvis was performed using the standard protocol following bolus administration of intravenous contrast. CONTRAST:  66mL OMNIPAQUE IOHEXOL 300 MG/ML  SOLN COMPARISON:  Same-day CT chest, CT abdomen pelvis, 07/16/2019 FINDINGS: Lower chest: Please see separately reported same day examination of the chest. Hepatobiliary: Multiple hypodense lesions of the liver parenchyma, which are new and enlarged compared to prior examination, the largest in the right lobe of the liver, hepatic segment VI, measuring 1.7 x 1.7 cm, previously 0.8 x 0.7 cm (series 3, image 23). A new lesion of the right lobe of the liver, just anterior to this lesion measures 0.9 x 0.8 cm (series 3, image 22). No gallstones, gallbladder wall thickening, or biliary dilatation. Pancreas: Unremarkable. No pancreatic ductal dilatation or surrounding inflammatory changes. Spleen: Normal in size without significant abnormality. Adrenals/Urinary Tract: Adrenal glands are unremarkable. Kidneys are normal, without renal calculi, solid lesion, or  hydronephrosis. Bladder is unremarkable. Stomach/Bowel: Stomach is within normal limits. Appendix is not clearly visualized. No evidence of bowel wall thickening, distention, or inflammatory changes. Sigmoid diverticulosis. Vascular/Lymphatic: No significant vascular findings are present. No enlarged abdominal or pelvic lymph nodes. Reproductive: No mass or other significant abnormality. Other: No abdominal wall hernia or abnormality. No abdominopelvic ascites. Musculoskeletal: No acute or significant osseous findings. IMPRESSION: 1. Multiple hypodense lesions of the liver parenchyma, which are new and enlarged compared to prior examination, consistent with worsened hepatic metastatic disease. 2. No other evidence of metastatic disease in the abdomen or pelvis. 3. Sigmoid diverticulosis. Electronically Signed   By: Eddie Candle M.D.   On: 10/21/2019 19:55   DG Chest Port 1 View  Result Date: 10/19/2019 CLINICAL DATA:  Cough for several days. EXAM: PORTABLE CHEST 1 VIEW COMPARISON:  February 04, 2013 FINDINGS: The mediastinal contour is stable. The heart size is enlarged. Patchy consolidation of the bilateral lung bases, left greater than right are noted. There is no pleural effusion. The bony structures are stable. IMPRESSION: Patchy consolidation of bilateral lung bases, left greater than right, suspicious for pneumonias. Electronically Signed   By: Abelardo Diesel M.D.   On: 10/19/2019 10:32    Microbiology: Recent Results (from the past 240 hour(s))  Urine culture     Status: Abnormal   Collection Time: 10/19/19  9:32 AM   Specimen: Urine, Random  Result Value Ref Range Status   Specimen Description   Final    URINE, RANDOM Performed at [facility], [address], [place], [place] [address]    Special Requests  Final    NONE Performed at [facility], [address] [place], [place] [address]    Culture MULTIPLE SPECIES PRESENT, SUGGEST RECOLLECTION (A)  Final   Report  Status 10/20/2019 FINAL  Final  Culture, blood (Routine X 2) w Reflex to ID Panel     Status: None   Collection Time: 10/19/19  9:32 AM   Specimen: BLOOD  Result Value Ref Range Status   Specimen Description BLOOD RAC  Final   Special Requests   Final    BOTTLES DRAWN AEROBIC AND ANAEROBIC Blood Culture adequate volume   Culture   Final    NO GROWTH 5 DAYS Performed at [facility], [address], [place], [place] [address]    Report Status 10/24/2019 FINAL  Final  Culture, blood (Routine X 2) w Reflex to ID Panel     Status: None   Collection Time: 10/19/19  9:32 AM   Specimen: BLOOD  Result Value Ref Range Status   Specimen Description BLOOD RIGHT FORE ARM  Final   Special Requests   Final    BOTTLES DRAWN AEROBIC AND ANAEROBIC Blood Culture adequate volume   Culture   Final    NO GROWTH 5 DAYS Performed at [facility], [address] [place], [place] [address]    Report Status 10/24/2019 FINAL  Final  Respiratory Panel by RT PCR (Flu A&B, Covid) - Nasopharyngeal Swab     Status: None   Collection Time: 10/19/19  9:32 AM   Specimen: Nasopharyngeal Swab  Result Value Ref Range Status   SARS Coronavirus 2 by RT PCR NEGATIVE NEGATIVE Final    Comment: (NOTE) SARS-CoV-2 target nucleic acids are NOT DETECTED.  The SARS-CoV-2 RNA is generally detectable in upper respiratoy specimens during the acute phase of infection. The lowest concentration of SARS-CoV-2 viral copies this assay can detect is 131 copies/mL. A negative result does not preclude SARS-Cov-2 infection and should not be used as the sole basis for treatment or other patient management decisions. A negative result may occur with  improper specimen collection/handling, submission of specimen other than nasopharyngeal swab, presence of viral mutation(s) within the areas targeted by this assay, and inadequate number of viral copies (<131 copies/mL). A negative result must be combined with  clinical observations, patient history, and epidemiological information. The expected result is Negative.  Fact Sheet for Patients:  PinkCheek.be  Fact Sheet for Healthcare Providers:  GravelBags.it  This test is no t yet approved or cleared by the [place] FDA and  has been authorized for detection and/or diagnosis of SARS-CoV-2 by FDA under an Emergency Use Authorization (EUA). This EUA will remain  in effect (meaning this test can be used) for the duration of the COVID-19 declaration under Section 564(b)(1) of the Act, 21 U.S.C. section 360bbb-3(b)(1), unless the authorization is terminated or revoked sooner.     Influenza A by PCR NEGATIVE NEGATIVE Final   Influenza B by PCR NEGATIVE NEGATIVE Final    Comment: (NOTE) The Xpert Xpress SARS-CoV-2/FLU/RSV assay is intended as an aid in  the diagnosis of influenza from Nasopharyngeal swab specimens and  should not be used as a sole basis for treatment. Nasal washings and  aspirates are unacceptable for Xpert Xpress SARS-CoV-2/FLU/RSV  testing.  Fact Sheet for Patients: PinkCheek.be  Fact Sheet for Healthcare Providers: GravelBags.it  This test is not yet approved or cleared by the [place] FDA and  has been authorized for detection and/or diagnosis of SARS-CoV-2 by  FDA under an Emergency Use Authorization (EUA). This EUA will remain  in effect (meaning this test can be used) for the duration of the  Covid-19 declaration under Section 564(b)(1) of the Act, 21  U.S.C. section 360bbb-3(b)(1), unless the authorization is  terminated or revoked. Performed at [facility], [address] [place], [place] [address]   MRSA PCR Screening     Status: None   Collection Time: 10/20/19 11:17 AM   Specimen: Nasopharyngeal  Result Value Ref Range Status   MRSA by PCR NEGATIVE NEGATIVE Final     Comment:        The GeneXpert MRSA Assay (FDA approved for NASAL specimens only), is one component of a comprehensive MRSA colonization surveillance program. It is not intended to diagnose MRSA infection nor to guide or monitor treatment for MRSA infections. Performed at [facility], [address] [place], [place] [address]      Labs: Basic Metabolic Panel: Recent Labs  Lab 10/22/19 0331  NA 142  K 3.4*  CL 105  CO2 28  GLUCOSE 116*  BUN 24*  CREATININE 0.73  CALCIUM 8.2*   Liver Function Tests: No results for input(s): AST, ALT, ALKPHOS, BILITOT, PROT, ALBUMIN in the last 168 hours. No results for input(s): LIPASE, AMYLASE in the last 168 hours. No results for input(s): AMMONIA in the last 168 hours. CBC: Recent Labs  Lab 10/21/19 0951  WBC 2.6*  HGB 12.1  HCT 35.7*  MCV 114.1*  PLT 78*   Cardiac Enzymes: No results for input(s): CKTOTAL, CKMB, CKMBINDEX, TROPONINI in the last 168 hours. BNP: BNP (last 3 results) Recent Labs    10/19/19 0932  BNP 204.4*    ProBNP (last 3 results) No results for input(s): PROBNP in the last 8760 hours.  CBG: No results for input(s): GLUCAP in the last 168 hours.     Signed:  Kayleen Memos, MD [facility] 10/27/2019, 1:04 PM

## 2019-10-28 DIAGNOSIS — C50912 Malignant neoplasm of unspecified site of left female breast: Secondary | ICD-10-CM | POA: Diagnosis not present

## 2019-10-28 DIAGNOSIS — K219 Gastro-esophageal reflux disease without esophagitis: Secondary | ICD-10-CM | POA: Diagnosis not present

## 2019-10-28 DIAGNOSIS — E876 Hypokalemia: Secondary | ICD-10-CM | POA: Diagnosis not present

## 2019-10-28 DIAGNOSIS — J189 Pneumonia, unspecified organism: Secondary | ICD-10-CM | POA: Diagnosis not present

## 2019-10-28 NOTE — Progress Notes (Signed)
[facility]) Hospital Liaison RN note:  Received request from [facility] and [place] for interest in [place]. Chart reviewed and eligibility is pending. Spoke with DSS case worker Beryle Beams # [phone] to acknowledge referral and explain services. Ms. Alfonzo Beers is the contact for completion of paperwork.  Visited with patient in room. She is in the bed, alert and pleasantly confused. No distress noted.   Unfortunately, Hospice Home does not have a bed to offer today. Hospital care team is aware. [facility] Liaison will follow for room availability.  Please call for any hospice related questions or concerns.  Thank you for the opportunity to participate in this patient's care.  Zandra Abts, RN [facility] Liaison [phone]

## 2019-10-28 NOTE — TOC Progression Note (Addendum)
Transition of Care [facility]) - Progression Note    Patient Details  Name: Kathy Mckay MRN: [id number] Date of Birth: 01-Jul-1932  [facility]) CM/SW [place], LCSW Phone Number: 10/28/2019, 10:33 AM  Clinical Narrative:   Left voicemail for DSS Supervisor Elaina Hoops requesting a return call so that staff can talk to [place] about wishes for patient.  2:28- Update from Palliative NP that she got in touch with guardian and guardian would like hospice home. Kieth Brightly with [facility] aware and patient is on their wait list for a hospice home bed.          Expected Discharge Plan and Services           Expected Discharge Date: 10/28/19                                     Social Determinants of Health (SDOH) Interventions    Readmission Risk Interventions No flowsheet data found.

## 2019-10-28 NOTE — Progress Notes (Signed)
Nutrition Brief Follow-Up Note  Chart reviewed. Patient has transitioned to comfort care.   No further nutrition interventions warranted at this time. Please re-consult RD as needed.   Moira Umholtz King, MS, RD, LDN Pager number available on Amion  

## 2019-10-28 NOTE — Care Management Important Message (Signed)
Important Message  Patient Details  Name: Kathy Mckay MRN: [id number] Date of Birth: 02/15/1932   Medicare Important Message Given:  Yes  [place] legal guardian aware of Medicare IM appeal right.  Copy faxed to [facility] attention previously.   Dannette Barbara 10/28/2019, 11:42 AM

## 2019-10-28 NOTE — Progress Notes (Signed)
° °  Daily Progress Note   Patient Name: Kathy Mckay       Date: 10/28/2019 DOB: 05-21-1932  Age: 86 y.o. MRN#: [id number] Attending Physician: Kayleen Memos, DO Primary Care Physician: Burnard Hawthorne, FNP Admit Date: 10/19/2019  Reason for Consultation/Follow-up: Establishing goals of care, Non pain symptom management, Pain control, Psychosocial/spiritual support and Terminal Care  Subjective: Patient resting, will open eyes intermittently. No acute distress noted. Continues to have poor appetite.   Was able to speak with patient's Legal Guardian LaShae Hickman. Updates provided. Education provided on hospice home facility and referral process. She verbalized understanding. [name] is requesting residential hospice home placement for [name]. She states she has spoken to her previous Group home Clinical research associate and [facility]) who is unable to provide the level of care that she will need. Latanya Maudlin is aware there are currently not any beds at the hospice home location in [place] (which is requested).   All questions answered.   Length of Stay: 9 days  Vital Signs: BP 137/82 (BP Location: Right Arm)    Pulse 72    Temp (!) 97.4 F (36.3 C) (Oral)    Resp 20    Ht 4\' 9"  (1.448 m)    Wt 40.8 kg    SpO2 96%    BMI 19.48 kg/m  SpO2: SpO2: 96 % O2 Device: O2 Device: Room Air O2 Flow Rate: O2 Flow Rate (L/min): 2 L/min  Physical Exam: Somnolent, but arousable. Chronically-ill appearing Normal breathing pattern Will not follow commands           Palliative Care Assessment & Plan   Code Status: DNR  Goals of Care/Recommendations: Continue with comfort care measures Spoke with legal guardian (LeShae Hickman) Updates provided. Education on hospice home and continued care. LeShae has requested hospice home placement as patient is unable to return to her Group home. She is aware there are not any beds available.  TOC/Karen, RN/Penny, RN ([place]) are aware of guardians  request for hospice home placement in [place].  PMT will continue to support and follow as needed.   Prognosis:  Days-weeks  Discharge Planning: Hospice facility  Thank you for allowing the Palliative Medicine Team to assist in the care of this patient.  Time Total: 35 min.   Visit consisted of counseling and education dealing with the complex and emotionally intense issues of symptom management and palliative care in the setting of serious and potentially life-threatening illness.Greater than 50%  of this time was spent counseling and coordinating care related to the above assessment and plan.  Alda Lea, AGPCNP-BC  Palliative Medicine Team [phone]

## 2019-10-28 NOTE — Progress Notes (Signed)
Discharge Summary  Kathy Mckay YKD:[id number] DOB: [birth year]  PCP: Burnard Hawthorne, FNP  Admit date: 10/19/2019 Discharge date: 10/28/2019  Time spent: 35 minutes  Recommendations for Outpatient Follow-up:  1. Continue hospice care  Discharge Diagnoses:  Active Hospital Problems   Diagnosis Date Noted  . HCAP (healthcare-associated pneumonia) 10/19/2019  . Sepsis ([place]) 10/19/2019  . Thrombocytopenia ([place]) 10/19/2019  . Hypokalemia 10/19/2019  . Malignant neoplasm of left breast in female, estrogen receptor positive ([place]) 07/28/2017  . Gastroesophageal reflux disease 04/27/2016  . Depression 03/01/2011  . Hypertension 10/20/2010    Resolved Hospital Problems  No resolved problems to display.    Discharge Condition: Guarded.  Diet recommendation: Pleasure feedings  Vitals:   10/27/19 1528 10/27/19 2315  BP: 130/76 137/82  Pulse: 80 72  Resp: 17 20  Temp: 98.6 F (37 C) (!) 97.4 F (36.3 C)  SpO2: 91% 96%    History of present illness:  86 year old lady prior history of hypertension, hyperlipidemia, depression, breast cancer, mental retardation, pernicious anemia, thrombocytopenia presents to ED with shortness of breath and cough.  Chest x-ray showed bilateral patchy infiltrates left worse than the right.  She was admitted for evaluation and management of healthcare associated pneumonia.  Patient is from group home and is a ward of the state and has a legal guardian.    CT chest with contrast done on 10/21/2019 showed Mild bilateral posterior basilar subsegmental atelectasis or infiltrates are noted. Mild left upper lobe opacity is noted concerning for pneumonia or atelectasis. 1.5 cm rounded hypodensity is noted in posterior portion of right hepatic lobe which appears to be enlarged compared to prior exam, concerning for worsening metastatic lesion.  CT abd and pelvis with contrast done on 10/21/2019 shows multiple hypodense lesions of the liver parenchyma, which  are new and enlarged compared to prior examination, consistent with worsened hepatic metastatic disease. Seen by palliative care team, legal guardianship made decision for comfort care only on 10/22/2019.  10/28/19: Seen and examined at bedside.  She has no new complaints.  She appears comfortable.  Awaiting bed placement.  Per PMT, legal gardian would like hospice facility in [place].  No beds available today.  Hospital Course:  Principal Problem:   HCAP (healthcare-associated pneumonia) Active Problems:   Hypertension   Depression   Gastroesophageal reflux disease   Malignant neoplasm of left breast in female, estrogen receptor positive ([place])   Sepsis ([place])   Thrombocytopenia ([place])   Hypokalemia  Assessment: Sepsis secondary to HCAP, POA Right hepatic lobe lesion measuring 1.5 cm suspicious for metastatic hepatocellular carcinoma Breast cancer on Ibrance and tamoxifen DNR with comfort care. Essential hypertension Severe protein calorie malnutrition Thrombocytopenia, chronic GERD Hypokalemia Failure to thrive in an adult Cognitive deficits  Plan: All goals directed at comfort care only.    Procedures:  None  Consultations:  Palliative care medicine  Discharge Exam: Unchanged from prior exam BP 137/82 (BP Location: Right Arm)   Pulse 72   Temp (!) 97.4 F (36.3 C) (Oral)   Resp 20   Ht 4\' 9"  (1.448 m)   Wt 40.8 kg   SpO2 96%   BMI 19.48 kg/m  . General: 86 y.o. year-old female chronically ill-appearing.  Alert.  Not in distress.  Appears comfortable. . Cardiovascular: Regular rate and rhythm with no rubs or gallops.  No thyromegaly or JVD noted.   Marland Kitchen Respiratory: Clear to auscultation with no wheezes or rales.  Poor inspiratory effort. . Abdomen: Soft nontender nondistended with normal  bowel sounds. . Musculoskeletal: No lower extremity edema bilaterally. Marland Kitchen Psychiatry: Mood is appropriate for condition and setting  Discharge Instructions You were cared  for by a hospitalist during your hospital stay. If you have any questions about your discharge medications or the care you received while you were in the hospital after you are discharged, you can call the unit and asked to speak with the hospitalist on call if the hospitalist that took care of you is not available. Once you are discharged, your primary care physician will handle any further medical issues. Please note that NO REFILLS for any discharge medications will be authorized once you are discharged, as it is imperative that you return to your primary care physician (or establish a relationship with a primary care physician if you do not have one) for your aftercare needs so that they can reassess your need for medications and monitor your lab values.   Allergies as of 10/28/2019   No Known Allergies     Medication List    STOP taking these medications   amLODipine 10 MG tablet Commonly known as: NORVASC   atorvastatin 40 MG tablet Commonly known as: LIPITOR   esomeprazole 40 MG capsule Commonly known as: NEXIUM   Ibrance 75 MG tablet Generic drug: palbociclib   Menthol (Topical Analgesic) 4 % Gel Commonly known as: Biofreeze   mirtazapine 45 MG tablet Commonly known as: REMERON   oxyCODONE-acetaminophen 5-325 MG tablet Commonly known as: PERCOCET/ROXICET   polyethylene glycol powder 17 GM/SCOOP powder Commonly known as: GLYCOLAX/MIRALAX   prochlorperazine 10 MG tablet Commonly known as: COMPAZINE   tamoxifen 20 MG tablet Commonly known as: NOLVADEX      No Known Allergies  Follow-up Information    Burnard Hawthorne, FNP.   Specialty: Family Medicine Why: October 25 @ 11:30a Contact information: [address] [place] [address] [place] [address] [phone]                The results of significant diagnostics from this hospitalization (including imaging, microbiology, ancillary and laboratory) are listed below for reference.    Significant  Diagnostic Studies: DG Chest 2 View  Result Date: 10/21/2019 CLINICAL DATA:  Recent pneumonia.  History of breast carcinoma EXAM: CHEST - 2 VIEW COMPARISON:  October 19, 2019 FINDINGS: There is airspace consolidation in the left lower lobe, consistent with pneumonia there is atelectatic change in the right base. Equivocal pleural effusion on the right. There is cardiomegaly with pulmonary vascularity within normal limits. There is aortic atherosclerosis. Bones are osteoporotic. IMPRESSION: Airspace opacifications consistent with pneumonia left lower lobe. Right base atelectasis with somewhat equivocal right pleural effusion. Stable cardiac prominence. Bones osteoporotic. Aortic Atherosclerosis (ICD10-I70.0). Electronically Signed   By: Lowella Grip III M.D.   On: 10/21/2019 09:30   CT CHEST W CONTRAST  Result Date: 10/21/2019 CLINICAL DATA:  Cough, dyspnea. History of metastatic breast cancer. EXAM: CT CHEST WITH CONTRAST TECHNIQUE: Multidetector CT imaging of the chest was performed during intravenous contrast administration. CONTRAST:  34mL OMNIPAQUE IOHEXOL 300 MG/ML  SOLN COMPARISON:  July 16, 2019. FINDINGS: Cardiovascular: Atherosclerosis of thoracic aorta is noted without aneurysm or dissection. Mild cardiomegaly is noted. No pericardial effusion is noted. Mediastinum/Nodes: No enlarged mediastinal, hilar, or axillary lymph nodes. Thyroid gland, trachea, and esophagus demonstrate no significant findings. Lungs/Pleura: No pneumothorax is noted. Minimal right pleural effusion is noted. Mild bilateral posterior basilar subsegmental atelectasis or infiltrates are noted. Mild left upper lobe opacity is noted concerning for pneumonia or atelectasis.  Upper Abdomen: 1.5 cm rounded hypodensity is noted in posterior portion of right hepatic lobe which appears to be enlarged compared to prior exam, concerning for worsening metastatic lesion. Musculoskeletal: Stable old lower thoracic compression fracture is  noted. Stable old upper thoracic compression fracture is noted. Severe thoracic kyphosis is noted. No acute abnormality is noted. IMPRESSION: 1. Mild bilateral posterior basilar subsegmental atelectasis or infiltrates are noted. 2. Mild left upper lobe opacity is noted concerning for pneumonia or atelectasis. 3. Minimal right pleural effusion is noted. 4. 1.5 cm rounded hypodensity is noted in posterior portion of right hepatic lobe which appears to be enlarged compared to prior exam, concerning for worsening metastatic lesion. 5. Stable old lower thoracic and upper thoracic compression fractures are noted. Severe thoracic kyphosis is noted. 6. Aortic atherosclerosis. Aortic Atherosclerosis (ICD10-I70.0). Electronically Signed   By: Marijo Conception M.D.   On: 10/21/2019 09:26   CT ABDOMEN PELVIS W CONTRAST  Result Date: 10/21/2019 CLINICAL DATA:  Metastatic disease evaluation, history of metastatic breast cancer EXAM: CT ABDOMEN AND PELVIS WITH CONTRAST TECHNIQUE: Multidetector CT imaging of the abdomen and pelvis was performed using the standard protocol following bolus administration of intravenous contrast. CONTRAST:  14mL OMNIPAQUE IOHEXOL 300 MG/ML  SOLN COMPARISON:  Same-day CT chest, CT abdomen pelvis, 07/16/2019 FINDINGS: Lower chest: Please see separately reported same day examination of the chest. Hepatobiliary: Multiple hypodense lesions of the liver parenchyma, which are new and enlarged compared to prior examination, the largest in the right lobe of the liver, hepatic segment VI, measuring 1.7 x 1.7 cm, previously 0.8 x 0.7 cm (series 3, image 23). A new lesion of the right lobe of the liver, just anterior to this lesion measures 0.9 x 0.8 cm (series 3, image 22). No gallstones, gallbladder wall thickening, or biliary dilatation. Pancreas: Unremarkable. No pancreatic ductal dilatation or surrounding inflammatory changes. Spleen: Normal in size without significant abnormality. Adrenals/Urinary  Tract: Adrenal glands are unremarkable. Kidneys are normal, without renal calculi, solid lesion, or hydronephrosis. Bladder is unremarkable. Stomach/Bowel: Stomach is within normal limits. Appendix is not clearly visualized. No evidence of bowel wall thickening, distention, or inflammatory changes. Sigmoid diverticulosis. Vascular/Lymphatic: No significant vascular findings are present. No enlarged abdominal or pelvic lymph nodes. Reproductive: No mass or other significant abnormality. Other: No abdominal wall hernia or abnormality. No abdominopelvic ascites. Musculoskeletal: No acute or significant osseous findings. IMPRESSION: 1. Multiple hypodense lesions of the liver parenchyma, which are new and enlarged compared to prior examination, consistent with worsened hepatic metastatic disease. 2. No other evidence of metastatic disease in the abdomen or pelvis. 3. Sigmoid diverticulosis. Electronically Signed   By: Eddie Candle M.D.   On: 10/21/2019 19:55   DG Chest Port 1 View  Result Date: 10/19/2019 CLINICAL DATA:  Cough for several days. EXAM: PORTABLE CHEST 1 VIEW COMPARISON:  February 04, 2013 FINDINGS: The mediastinal contour is stable. The heart size is enlarged. Patchy consolidation of the bilateral lung bases, left greater than right are noted. There is no pleural effusion. The bony structures are stable. IMPRESSION: Patchy consolidation of bilateral lung bases, left greater than right, suspicious for pneumonias. Electronically Signed   By: Abelardo Diesel M.D.   On: 10/19/2019 10:32    Microbiology: Recent Results (from the past 240 hour(s))  Urine culture     Status: Abnormal   Collection Time: 10/19/19  9:32 AM   Specimen: Urine, Random  Result Value Ref Range Status   Specimen Description   Final  URINE, RANDOM Performed at [facility], [address] [place], [place] [address]    Special Requests   Final    NONE Performed at [facility], [address]  [place], [place] [address]    Culture MULTIPLE SPECIES PRESENT, SUGGEST RECOLLECTION (A)  Final   Report Status 10/20/2019 FINAL  Final  Culture, blood (Routine X 2) w Reflex to ID Panel     Status: None   Collection Time: 10/19/19  9:32 AM   Specimen: BLOOD  Result Value Ref Range Status   Specimen Description BLOOD RAC  Final   Special Requests   Final    BOTTLES DRAWN AEROBIC AND ANAEROBIC Blood Culture adequate volume   Culture   Final    NO GROWTH 5 DAYS Performed at [facility], [address] [place], [place] [address]    Report Status 10/24/2019 FINAL  Final  Culture, blood (Routine X 2) w Reflex to ID Panel     Status: None   Collection Time: 10/19/19  9:32 AM   Specimen: BLOOD  Result Value Ref Range Status   Specimen Description BLOOD RIGHT FORE ARM  Final   Special Requests   Final    BOTTLES DRAWN AEROBIC AND ANAEROBIC Blood Culture adequate volume   Culture   Final    NO GROWTH 5 DAYS Performed at [facility], [address], [place], [place] [address]    Report Status 10/24/2019 FINAL  Final  Respiratory Panel by RT PCR (Flu A&B, Covid) - Nasopharyngeal Swab     Status: None   Collection Time: 10/19/19  9:32 AM   Specimen: Nasopharyngeal Swab  Result Value Ref Range Status   SARS Coronavirus 2 by RT PCR NEGATIVE NEGATIVE Final    Comment: (NOTE) SARS-CoV-2 target nucleic acids are NOT DETECTED.  The SARS-CoV-2 RNA is generally detectable in upper respiratoy specimens during the acute phase of infection. The lowest concentration of SARS-CoV-2 viral copies this assay can detect is 131 copies/mL. A negative result does not preclude SARS-Cov-2 infection and should not be used as the sole basis for treatment or other patient management decisions. A negative result may occur with  improper specimen collection/handling, submission of specimen other than nasopharyngeal swab, presence of viral mutation(s) within the areas targeted by this assay, and  inadequate number of viral copies (<131 copies/mL). A negative result must be combined with clinical observations, patient history, and epidemiological information. The expected result is Negative.  Fact Sheet for Patients:  PinkCheek.be  Fact Sheet for Healthcare Providers:  GravelBags.it  This test is no t yet approved or cleared by the [place] FDA and  has been authorized for detection and/or diagnosis of SARS-CoV-2 by FDA under an Emergency Use Authorization (EUA). This EUA will remain  in effect (meaning this test can be used) for the duration of the COVID-19 declaration under Section 564(b)(1) of the Act, 21 U.S.C. section 360bbb-3(b)(1), unless the authorization is terminated or revoked sooner.     Influenza A by PCR NEGATIVE NEGATIVE Final   Influenza B by PCR NEGATIVE NEGATIVE Final    Comment: (NOTE) The Xpert Xpress SARS-CoV-2/FLU/RSV assay is intended as an aid in  the diagnosis of influenza from Nasopharyngeal swab specimens and  should not be used as a sole basis for treatment. Nasal washings and  aspirates are unacceptable for Xpert Xpress SARS-CoV-2/FLU/RSV  testing.  Fact Sheet for Patients: PinkCheek.be  Fact Sheet for Healthcare Providers: GravelBags.it  This test is not yet  approved or cleared by the [place] and  has been authorized for detection and/or diagnosis of SARS-CoV-2 by  FDA under an Emergency Use Authorization (EUA). This EUA will remain  in effect (meaning this test can be used) for the duration of the  Covid-19 declaration under Section 564(b)(1) of the Act, 21  U.S.C. section 360bbb-3(b)(1), unless the authorization is  terminated or revoked. Performed at [facility], [address] [place], [place] [address]   MRSA PCR Screening     Status: None   Collection Time: 10/20/19 11:17 AM   Specimen:  Nasopharyngeal  Result Value Ref Range Status   MRSA by PCR NEGATIVE NEGATIVE Final    Comment:        The GeneXpert MRSA Assay (FDA approved for NASAL specimens only), is one component of a comprehensive MRSA colonization surveillance program. It is not intended to diagnose MRSA infection nor to guide or monitor treatment for MRSA infections. Performed at [facility], [address] [place], [place] [address]      Labs: Basic Metabolic Panel: Recent Labs  Lab 10/22/19 0331  NA 142  K 3.4*  CL 105  CO2 28  GLUCOSE 116*  BUN 24*  CREATININE 0.73  CALCIUM 8.2*   Liver Function Tests: No results for input(s): AST, ALT, ALKPHOS, BILITOT, PROT, ALBUMIN in the last 168 hours. No results for input(s): LIPASE, AMYLASE in the last 168 hours. No results for input(s): AMMONIA in the last 168 hours. CBC: No results for input(s): WBC, NEUTROABS, HGB, HCT, MCV, PLT in the last 168 hours. Cardiac Enzymes: No results for input(s): CKTOTAL, CKMB, CKMBINDEX, TROPONINI in the last 168 hours. BNP: BNP (last 3 results) Recent Labs    10/19/19 0932  BNP 204.4*    ProBNP (last 3 results) No results for input(s): PROBNP in the last 8760 hours.  CBG: No results for input(s): GLUCAP in the last 168 hours.     Signed:  Kayleen Memos, MD [facility] 10/28/2019, 12:14 PM

## 2019-10-29 DIAGNOSIS — J189 Pneumonia, unspecified organism: Secondary | ICD-10-CM | POA: Diagnosis not present

## 2019-10-29 MED ORDER — CHLORHEXIDINE GLUCONATE 0.12 % MT SOLN
15.0000 mL | Freq: Two times a day (BID) | OROMUCOSAL | Status: DC
  Administered 2019-10-29 – 2019-11-06 (×12): 15 mL via OROMUCOSAL
  Filled 2019-10-29 (×6): qty 15

## 2019-10-29 NOTE — Plan of Care (Signed)
  Pt is confused - cannot take in or comprehend education-  pt was found with stool on hands and in mouth - oral care was performed and hands/face washed - pt is pleasant and was cooperative with care.

## 2019-10-29 NOTE — Discharge Summary (Signed)
Discharge Summary  Kathy Mckay:[id number] DOB: 1932-02-06  PCP: Burnard Hawthorne, FNP  Admit date: 10/19/2019 Discharge date: 10/29/2019  Time spent: 35 minutes  Recommendations for Outpatient Follow-up:  1. Continue hospice care  Discharge Diagnoses:  Active Hospital Problems   Diagnosis Date Noted  . HCAP (healthcare-associated pneumonia) 10/19/2019  . Sepsis ([place]) 10/19/2019  . Thrombocytopenia ([place]) 10/19/2019  . Hypokalemia 10/19/2019  . Malignant neoplasm of left breast in female, estrogen receptor positive ([place]) 07/28/2017  . Gastroesophageal reflux disease 04/27/2016  . Depression 03/01/2011  . Hypertension 10/20/2010    Resolved Hospital Problems  No resolved problems to display.    Discharge Condition: Guarded.  Diet recommendation: Pleasure feedings  Vitals:   10/28/19 1654 10/29/19 0810  BP: (!) 118/48 (!) 144/90  Pulse: 85 86  Resp: 18 17  Temp: 97.8 F (36.6 C) 98.1 F (36.7 C)  SpO2: 95% 94%    History of present illness:  86 year old lady prior history of hypertension, hyperlipidemia, depression, breast cancer, mental retardation, pernicious anemia, thrombocytopenia presents to ED with shortness of breath and cough.  Chest x-ray showed bilateral patchy infiltrates left worse than the right.  She was admitted for evaluation and management of healthcare associated pneumonia.  Patient is from group home and is a ward of the state and has a legal guardian.    CT chest with contrast done on 10/21/2019 showed Mild bilateral posterior basilar subsegmental atelectasis or infiltrates are noted. Mild left upper lobe opacity is noted concerning for pneumonia or atelectasis. 1.5 cm rounded hypodensity is noted in posterior portion of right hepatic lobe which appears to be enlarged compared to prior exam, concerning for worsening metastatic lesion.  CT abd and pelvis with contrast done on 10/21/2019 shows multiple hypodense lesions of the liver parenchyma,  which are new and enlarged compared to prior examination, consistent with worsened hepatic metastatic disease. Seen by palliative care team, legal guardianship made decision for comfort care only on 10/22/2019.  10/29/19: Seen and examined at bedside.  No acute events overnight.  She appears comfortable.  She has no new complaints.  Awaiting bed placement.  Per PMT, legal gardian would like hospice facility in [place].    Hospital Course:  Principal Problem:   HCAP (healthcare-associated pneumonia) Active Problems:   Hypertension   Depression   Gastroesophageal reflux disease   Malignant neoplasm of left breast in female, estrogen receptor positive ([place])   Sepsis ([place])   Thrombocytopenia ([place])   Hypokalemia  Assessment: -Sepsis secondary to HCAP, POA -Right hepatic lobe lesion measuring 1.5 cm suspicious for metastatic hepatocellular carcinoma -Breast cancer on Ibrance and tamoxifen -DNR with comfort care. -Essential hypertension -Severe protein calorie malnutrition -Thrombocytopenia, chronic -GERD -Hypokalemia -Failure to thrive in an adult -Cognitive deficits  Plan: All goals directed at comfort care only.    Procedures:  None  Consultations:  Palliative care medicine  Discharge Exam: Physical exam unchanged from prior exam. BP (!) 144/90 (BP Location: Left Arm)   Pulse 86   Temp 98.1 F (36.7 C) (Oral)   Resp 17   Ht 4\' 9"  (1.448 m)   Wt 40.8 kg   SpO2 94%   BMI 19.48 kg/m  . General: 86 y.o. year-old female chronically ill-appearing.  Alert.  Not in distress.  Appears comfortable. . Cardiovascular: Regular rate and rhythm with no rubs or gallops.  No thyromegaly or JVD noted.   Marland Kitchen Respiratory: Clear to auscultation with no wheezes or rales.  Poor inspiratory effort. . Abdomen:  Soft nontender nondistended with normal bowel sounds. . Musculoskeletal: No lower extremity edema bilaterally. Marland Kitchen Psychiatry: Mood is appropriate for condition and  setting  Discharge Instructions You were cared for by a hospitalist during your hospital stay. If you have any questions about your discharge medications or the care you received while you were in the hospital after you are discharged, you can call the unit and asked to speak with the hospitalist on call if the hospitalist that took care of you is not available. Once you are discharged, your primary care physician will handle any further medical issues. Please note that NO REFILLS for any discharge medications will be authorized once you are discharged, as it is imperative that you return to your primary care physician (or establish a relationship with a primary care physician if you do not have one) for your aftercare needs so that they can reassess your need for medications and monitor your lab values.   Allergies as of 10/29/2019   No Known Allergies     Medication List    STOP taking these medications   amLODipine 10 MG tablet Commonly known as: NORVASC   atorvastatin 40 MG tablet Commonly known as: LIPITOR   esomeprazole 40 MG capsule Commonly known as: NEXIUM   Ibrance 75 MG tablet Generic drug: palbociclib   Menthol (Topical Analgesic) 4 % Gel Commonly known as: Biofreeze   mirtazapine 45 MG tablet Commonly known as: REMERON   oxyCODONE-acetaminophen 5-325 MG tablet Commonly known as: PERCOCET/ROXICET   polyethylene glycol powder 17 GM/SCOOP powder Commonly known as: GLYCOLAX/MIRALAX   prochlorperazine 10 MG tablet Commonly known as: COMPAZINE   tamoxifen 20 MG tablet Commonly known as: NOLVADEX      No Known Allergies  Follow-up Information    Burnard Hawthorne, FNP.   Specialty: Family Medicine Why: October 25 @ 11:30a Contact information: [address] [place] [address] [place] [address] [phone]                The results of significant diagnostics from this hospitalization (including imaging, microbiology, ancillary and laboratory) are  listed below for reference.    Significant Diagnostic Studies: DG Chest 2 View  Result Date: 10/21/2019 CLINICAL DATA:  Recent pneumonia.  History of breast carcinoma EXAM: CHEST - 2 VIEW COMPARISON:  October 19, 2019 FINDINGS: There is airspace consolidation in the left lower lobe, consistent with pneumonia there is atelectatic change in the right base. Equivocal pleural effusion on the right. There is cardiomegaly with pulmonary vascularity within normal limits. There is aortic atherosclerosis. Bones are osteoporotic. IMPRESSION: Airspace opacifications consistent with pneumonia left lower lobe. Right base atelectasis with somewhat equivocal right pleural effusion. Stable cardiac prominence. Bones osteoporotic. Aortic Atherosclerosis (ICD10-I70.0). Electronically Signed   By: Lowella Grip III M.D.   On: 10/21/2019 09:30   CT CHEST W CONTRAST  Result Date: 10/21/2019 CLINICAL DATA:  Cough, dyspnea. History of metastatic breast cancer. EXAM: CT CHEST WITH CONTRAST TECHNIQUE: Multidetector CT imaging of the chest was performed during intravenous contrast administration. CONTRAST:  74mL OMNIPAQUE IOHEXOL 300 MG/ML  SOLN COMPARISON:  July 16, 2019. FINDINGS: Cardiovascular: Atherosclerosis of thoracic aorta is noted without aneurysm or dissection. Mild cardiomegaly is noted. No pericardial effusion is noted. Mediastinum/Nodes: No enlarged mediastinal, hilar, or axillary lymph nodes. Thyroid gland, trachea, and esophagus demonstrate no significant findings. Lungs/Pleura: No pneumothorax is noted. Minimal right pleural effusion is noted. Mild bilateral posterior basilar subsegmental atelectasis or infiltrates are noted. Mild left upper lobe opacity is noted  concerning for pneumonia or atelectasis. Upper Abdomen: 1.5 cm rounded hypodensity is noted in posterior portion of right hepatic lobe which appears to be enlarged compared to prior exam, concerning for worsening metastatic lesion. Musculoskeletal:  Stable old lower thoracic compression fracture is noted. Stable old upper thoracic compression fracture is noted. Severe thoracic kyphosis is noted. No acute abnormality is noted. IMPRESSION: 1. Mild bilateral posterior basilar subsegmental atelectasis or infiltrates are noted. 2. Mild left upper lobe opacity is noted concerning for pneumonia or atelectasis. 3. Minimal right pleural effusion is noted. 4. 1.5 cm rounded hypodensity is noted in posterior portion of right hepatic lobe which appears to be enlarged compared to prior exam, concerning for worsening metastatic lesion. 5. Stable old lower thoracic and upper thoracic compression fractures are noted. Severe thoracic kyphosis is noted. 6. Aortic atherosclerosis. Aortic Atherosclerosis (ICD10-I70.0). Electronically Signed   By: Marijo Conception M.D.   On: 10/21/2019 09:26   CT ABDOMEN PELVIS W CONTRAST  Result Date: 10/21/2019 CLINICAL DATA:  Metastatic disease evaluation, history of metastatic breast cancer EXAM: CT ABDOMEN AND PELVIS WITH CONTRAST TECHNIQUE: Multidetector CT imaging of the abdomen and pelvis was performed using the standard protocol following bolus administration of intravenous contrast. CONTRAST:  23mL OMNIPAQUE IOHEXOL 300 MG/ML  SOLN COMPARISON:  Same-day CT chest, CT abdomen pelvis, 07/16/2019 FINDINGS: Lower chest: Please see separately reported same day examination of the chest. Hepatobiliary: Multiple hypodense lesions of the liver parenchyma, which are new and enlarged compared to prior examination, the largest in the right lobe of the liver, hepatic segment VI, measuring 1.7 x 1.7 cm, previously 0.8 x 0.7 cm (series 3, image 23). A new lesion of the right lobe of the liver, just anterior to this lesion measures 0.9 x 0.8 cm (series 3, image 22). No gallstones, gallbladder wall thickening, or biliary dilatation. Pancreas: Unremarkable. No pancreatic ductal dilatation or surrounding inflammatory changes. Spleen: Normal in size  without significant abnormality. Adrenals/Urinary Tract: Adrenal glands are unremarkable. Kidneys are normal, without renal calculi, solid lesion, or hydronephrosis. Bladder is unremarkable. Stomach/Bowel: Stomach is within normal limits. Appendix is not clearly visualized. No evidence of bowel wall thickening, distention, or inflammatory changes. Sigmoid diverticulosis. Vascular/Lymphatic: No significant vascular findings are present. No enlarged abdominal or pelvic lymph nodes. Reproductive: No mass or other significant abnormality. Other: No abdominal wall hernia or abnormality. No abdominopelvic ascites. Musculoskeletal: No acute or significant osseous findings. IMPRESSION: 1. Multiple hypodense lesions of the liver parenchyma, which are new and enlarged compared to prior examination, consistent with worsened hepatic metastatic disease. 2. No other evidence of metastatic disease in the abdomen or pelvis. 3. Sigmoid diverticulosis. Electronically Signed   By: Eddie Candle M.D.   On: 10/21/2019 19:55   DG Chest Port 1 View  Result Date: 10/19/2019 CLINICAL DATA:  Cough for several days. EXAM: PORTABLE CHEST 1 VIEW COMPARISON:  February 04, 2013 FINDINGS: The mediastinal contour is stable. The heart size is enlarged. Patchy consolidation of the bilateral lung bases, left greater than right are noted. There is no pleural effusion. The bony structures are stable. IMPRESSION: Patchy consolidation of bilateral lung bases, left greater than right, suspicious for pneumonias. Electronically Signed   By: Abelardo Diesel M.D.   On: 10/19/2019 10:32    Microbiology: Recent Results (from the past 240 hour(s))  MRSA PCR Screening     Status: None   Collection Time: 10/20/19 11:17 AM   Specimen: Nasopharyngeal  Result Value Ref Range Status   MRSA by PCR  NEGATIVE NEGATIVE Final    Comment:        The GeneXpert MRSA Assay (FDA approved for NASAL specimens only), is one component of a comprehensive MRSA  colonization surveillance program. It is not intended to diagnose MRSA infection nor to guide or monitor treatment for MRSA infections. Performed at [facility], [address] [place], [place] [address]      Labs: Basic Metabolic Panel: No results for input(s): NA, K, CL, CO2, GLUCOSE, BUN, CREATININE, CALCIUM, MG, PHOS in the last 168 hours. Liver Function Tests: No results for input(s): AST, ALT, ALKPHOS, BILITOT, PROT, ALBUMIN in the last 168 hours. No results for input(s): LIPASE, AMYLASE in the last 168 hours. No results for input(s): AMMONIA in the last 168 hours. CBC: No results for input(s): WBC, NEUTROABS, HGB, HCT, MCV, PLT in the last 168 hours. Cardiac Enzymes: No results for input(s): CKTOTAL, CKMB, CKMBINDEX, TROPONINI in the last 168 hours. BNP: BNP (last 3 results) Recent Labs    10/19/19 0932  BNP 204.4*    ProBNP (last 3 results) No results for input(s): PROBNP in the last 8760 hours.  CBG: No results for input(s): GLUCAP in the last 168 hours.     Signed:  Kayleen Memos, MD [facility] 10/29/2019, 12:37 PM

## 2019-10-29 NOTE — Progress Notes (Signed)
[facility]) Hospital Liaison RN note:  Visited with patient in room. She is resting quietly in the bed. Alert but confused. Simply states "I am fine".   Hospice Home does not have a bed to offer today. Hospital care team is aware. [facility] Liaison will follow for room availability.  Please call with any hospice related question or concerns.  Zandra Abts, RN [facility] Liaison [phone]

## 2019-10-30 DIAGNOSIS — J189 Pneumonia, unspecified organism: Secondary | ICD-10-CM | POA: Diagnosis not present

## 2019-10-30 MED ORDER — HALOPERIDOL LACTATE 5 MG/ML IJ SOLN
0.5000 mg | INTRAMUSCULAR | Status: DC | PRN
  Administered 2019-10-30: 0.5 mg via INTRAMUSCULAR
  Filled 2019-10-30: qty 1

## 2019-10-30 NOTE — Progress Notes (Signed)
PROGRESS NOTE    Kathy Mckay  VPX:[id number] DOB: [birth year] DOA: 10/19/2019 PCP: Burnard Hawthorne, FNP  Brief Narrative:  86 year old lady prior history of hypertension, hyperlipidemia, depression, breast cancer, mental retardation, pernicious anemia, thrombocytopenia presents to ED with shortness of breath and cough. Chest x-ray showed bilateral patchy infiltrates left worse than the right. She was admitted for evaluation and management of healthcare associated pneumonia. Patient is from group home and is a ward of the state and has a legal guardian.    CT chest with contrast done on 10/21/2019 showed Mild bilateral posterior basilar subsegmental atelectasis or infiltrates are noted. Mild left upper lobe opacity is noted concerning for pneumonia or atelectasis. 1.5 cm rounded hypodensity is noted in posterior portion of right hepatic lobe which appears to be enlarged compared to prior exam, concerning for worsening metastatic lesion. CT abd and pelvis with contrast done on 10/21/2019 shows multiplehypodense lesions of the liver parenchyma, which are new and enlarged compared to prior examination, consistent with worsened hepatic metastatic disease. Seen by palliative care team, legal guardianship made decision for comfort care only on 10/22/2019.  No acute issues overnight.  Per nursing staff patient has been noted to make attempts to eat her own stool.   Assessment & Plan:   Principal Problem:   HCAP (healthcare-associated pneumonia) Active Problems:   Hypertension   Depression   Gastroesophageal reflux disease   Malignant neoplasm of left breast in female, estrogen receptor positive ([place])   Sepsis ([place])   Thrombocytopenia ([place])   Hypokalemia  Assessment: -Sepsis secondary to HCAP, POA -Right hepatic lobe lesion measuring 1.5 cm suspicious for metastatic hepatocellular carcinoma -Breast cancer on Ibrance and tamoxifen -DNR with comfort care. -Essential  hypertension -Severe protein calorie malnutrition -Thrombocytopenia, chronic -GERD -Hypokalemia -Failure to thrive in an adult -Cognitive deficits  Plan: All goals directed at comfort care only.    DVT prophylaxis: Comfort Code Status: DNR Family Communication: None  disposition Plan: Status is: Inpatient  Remains inpatient appropriate because:Unsafe d/c plan   Dispo:  Patient From: Group Home  Planned Disposition: Residential Hospice  Expected discharge date: 10/31/19  Medically stable for discharge: Yes   Consultants:   Palliative care  Procedures:   None  Antimicrobials:   None   Subjective: Seen and examined.  Patient with no complaints.  Appears frail.  Laying in bed.  States "I am fine"  Objective: Vitals:   10/27/19 2315 10/28/19 1654 10/29/19 0810 10/30/19 0915  BP: 137/82 (!) 118/48 (!) 144/90 139/75  Pulse: 72 85 86 69  Resp: 20 18 17    Temp: (!) 97.4 F (36.3 C) 97.8 F (36.6 C) 98.1 F (36.7 C) 97.7 F (36.5 C)  TempSrc: Oral  Oral Oral  SpO2: 96% 95% 94% 95%  Weight:      Height:        Intake/Output Summary (Last 24 hours) at 10/30/2019 1125 Last data filed at 10/30/2019 0900 Gross per 24 hour  Intake 0 ml  Output --  Net 0 ml   Filed Weights   10/19/19 0929  Weight: 40.8 kg    Examination:  Limited exam due to comfort measure status  General exam: No acute distress.  Appears frail and chronically ill Respiratory system: Clear to auscultation. Respiratory effort normal. Cardiovascular system: S1-S2 heard.  No murmurs.    Data Reviewed: I have personally reviewed following labs and imaging studies  CBC: No results for input(s): WBC, NEUTROABS, HGB, HCT, MCV, PLT in the last 168 hours.  Basic Metabolic Panel: No results for input(s): NA, K, CL, CO2, GLUCOSE, BUN, CREATININE, CALCIUM, MG, PHOS in the last 168 hours. GFR: Estimated Creatinine Clearance: 31.3 mL/min (by C-G formula based on SCr of 0.73 mg/dL). Liver  Function Tests: No results for input(s): AST, ALT, ALKPHOS, BILITOT, PROT, ALBUMIN in the last 168 hours. No results for input(s): LIPASE, AMYLASE in the last 168 hours. No results for input(s): AMMONIA in the last 168 hours. Coagulation Profile: No results for input(s): INR, PROTIME in the last 168 hours. Cardiac Enzymes: No results for input(s): CKTOTAL, CKMB, CKMBINDEX, TROPONINI in the last 168 hours. BNP (last 3 results) No results for input(s): PROBNP in the last 8760 hours. HbA1C: No results for input(s): HGBA1C in the last 72 hours. CBG: No results for input(s): GLUCAP in the last 168 hours. Lipid Profile: No results for input(s): CHOL, HDL, LDLCALC, TRIG, CHOLHDL, LDLDIRECT in the last 72 hours. Thyroid Function Tests: No results for input(s): TSH, T4TOTAL, FREET4, T3FREE, THYROIDAB in the last 72 hours. Anemia Panel: No results for input(s): VITAMINB12, FOLATE, FERRITIN, TIBC, IRON, RETICCTPCT in the last 72 hours. Sepsis Labs: No results for input(s): PROCALCITON, LATICACIDVEN in the last 168 hours.  No results found for this or any previous visit (from the past 240 hour(s)).       Radiology Studies: No results found.      Scheduled Meds: . chlorhexidine  15 mL Mouth/Throat BID   Continuous Infusions: . sodium chloride 10 mL/hr at 10/22/19 1623     LOS: 11 days    Time spent: 15 minutes    Sidney Ace, MD [facility] Pager [phone]  If 7PM-7AM, please contact night-coverage 10/30/2019, 11:25 AM

## 2019-10-30 NOTE — Plan of Care (Signed)
  Problem: Pain Managment: Goal: General experience of comfort will improve Outcome: Progressing   Problem: Safety: Goal: Ability to remain free from injury will improve Outcome: Progressing   Problem: Activity: Goal: Ability to tolerate increased activity will improve Outcome: Progressing   Problem: Clinical Measurements: Goal: Ability to maintain a body temperature in the normal range will improve Outcome: Progressing   Problem: Respiratory: Goal: Ability to maintain adequate ventilation will improve Outcome: Progressing Goal: Ability to maintain a clear airway will improve Outcome: Progressing

## 2019-10-30 NOTE — Progress Notes (Signed)
[facility]) Hospital Liaison RN note:  Visited with patient in room. She is resting quietly in the bed. Offered warm blanket. Spoke with RN and NA regarding patient. No concerns.  Hospice Home does not have a bed available to offer today. Hospital care team is aware. [facility] Liaison will follow for room availability.   Please call with any hospice related questions or concerns.  Thank you.  Zandra Abts, RN [facility] Liaison [phone]

## 2019-10-30 NOTE — Progress Notes (Signed)
Patient continues to 'dig' stool from her rectum. RN gor order from provider to change route of existing order for Haldol IV to IM. Will monitor for efficacy.

## 2019-10-31 ENCOUNTER — Telehealth: Payer: Self-pay

## 2019-10-31 DIAGNOSIS — J189 Pneumonia, unspecified organism: Secondary | ICD-10-CM | POA: Diagnosis not present

## 2019-10-31 NOTE — Care Management Important Message (Signed)
Important Message  Patient Details  Name: Kathy Mckay MRN: [id number] Date of Birth: [birth year]   Medicare Important Message Given:  Other (see comment)  Patient is on Comfort Measures and waiting for Hospice Home bed.  Out of respect for the patient and family no Important Message given.  Juliann Pulse A Sherion Dooly 10/31/2019, 1:15 PM

## 2019-10-31 NOTE — Progress Notes (Signed)
PROGRESS NOTE    Kathy Mckay  OQH:[id number] DOB: Aug 10, 1932 DOA: 10/19/2019 PCP: Burnard Hawthorne, FNP  Brief Narrative:  86 year old lady prior history of hypertension, hyperlipidemia, depression, breast cancer, mental retardation, pernicious anemia, thrombocytopenia presents to ED with shortness of breath and cough. Chest x-ray showed bilateral patchy infiltrates left worse than the right. She was admitted for evaluation and management of healthcare associated pneumonia. Patient is from group home and is a ward of the state and has a legal guardian.    CT chest with contrast done on 10/21/2019 showed Mild bilateral posterior basilar subsegmental atelectasis or infiltrates are noted. Mild left upper lobe opacity is noted concerning for pneumonia or atelectasis. 1.5 cm rounded hypodensity is noted in posterior portion of right hepatic lobe which appears to be enlarged compared to prior exam, concerning for worsening metastatic lesion. CT abd and pelvis with contrast done on 10/21/2019 shows multiplehypodense lesions of the liver parenchyma, which are new and enlarged compared to prior examination, consistent with worsened hepatic metastatic disease. Seen by palliative care team, legal guardianship made decision for comfort care only on 10/22/2019.    Assessment & Plan:   Principal Problem:   HCAP (healthcare-associated pneumonia) Active Problems:   Hypertension   Depression   Gastroesophageal reflux disease   Malignant neoplasm of left breast in female, estrogen receptor positive ([place])   Sepsis ([place])   Thrombocytopenia ([place])   Hypokalemia  Assessment: -Sepsis secondary to HCAP, POA -Right hepatic lobe lesion measuring 1.5 cm suspicious for metastatic hepatocellular carcinoma -Breast cancer on Ibrance and tamoxifen -DNR with comfort care. -Essential hypertension -Severe protein calorie malnutrition -Thrombocytopenia, chronic -GERD -Hypokalemia -Failure to thrive in an  adult -Cognitive deficits  Plan: All goals directed at comfort care only.    DVT prophylaxis: Comfort Code Status: DNR Family Communication: None  disposition Plan: Status is: Inpatient  Remains inpatient appropriate because:Unsafe d/c plan   Dispo:  Patient From: Group Home  Planned Disposition: Residential Hospice  Expected discharge date: 10/31/19  Medically stable for discharge: Yes   As of this date no inpatient hospice beds available.  Hospice liaison following   Consultants:   Palliative care  Procedures:   None  Antimicrobials:   None   Subjective: Seen and examined.  Patient with no complaints.  Appears frail.  Laying in bed.   Objective: Vitals:   10/29/19 0810 10/30/19 0915 10/30/19 1547 10/31/19 0726  BP: (!) 144/90 139/75 109/66 133/68  Pulse: 86 69 79 72  Resp: 17  18 20   Temp: 98.1 F (36.7 C) 97.7 F (36.5 C) 98.2 F (36.8 C) 98 F (36.7 C)  TempSrc: Oral Oral  Oral  SpO2: 94% 95% 97% 98%  Weight:      Height:        Intake/Output Summary (Last 24 hours) at 10/31/2019 1042 Last data filed at 10/31/2019 1034 Gross per 24 hour  Intake 60 ml  Output 2 ml  Net 58 ml   Filed Weights   10/19/19 0929  Weight: 40.8 kg    Examination:  Limited exam due to comfort measure status  General exam: No acute distress.  Appears frail and chronically ill Respiratory system: Clear to auscultation. Respiratory effort normal. Cardiovascular system: S1-S2 heard.  No murmurs.    Data Reviewed: I have personally reviewed following labs and imaging studies  CBC: No results for input(s): WBC, NEUTROABS, HGB, HCT, MCV, PLT in the last 168 hours. Basic Metabolic Panel: No results for input(s): NA, K, CL,  CO2, GLUCOSE, BUN, CREATININE, CALCIUM, MG, PHOS in the last 168 hours. GFR: Estimated Creatinine Clearance: 31.3 mL/min (by C-G formula based on SCr of 0.73 mg/dL). Liver Function Tests: No results for input(s): AST, ALT, ALKPHOS,  BILITOT, PROT, ALBUMIN in the last 168 hours. No results for input(s): LIPASE, AMYLASE in the last 168 hours. No results for input(s): AMMONIA in the last 168 hours. Coagulation Profile: No results for input(s): INR, PROTIME in the last 168 hours. Cardiac Enzymes: No results for input(s): CKTOTAL, CKMB, CKMBINDEX, TROPONINI in the last 168 hours. BNP (last 3 results) No results for input(s): PROBNP in the last 8760 hours. HbA1C: No results for input(s): HGBA1C in the last 72 hours. CBG: No results for input(s): GLUCAP in the last 168 hours. Lipid Profile: No results for input(s): CHOL, HDL, LDLCALC, TRIG, CHOLHDL, LDLDIRECT in the last 72 hours. Thyroid Function Tests: No results for input(s): TSH, T4TOTAL, FREET4, T3FREE, THYROIDAB in the last 72 hours. Anemia Panel: No results for input(s): VITAMINB12, FOLATE, FERRITIN, TIBC, IRON, RETICCTPCT in the last 72 hours. Sepsis Labs: No results for input(s): PROCALCITON, LATICACIDVEN in the last 168 hours.  No results found for this or any previous visit (from the past 240 hour(s)).       Radiology Studies: No results found.      Scheduled Meds: . chlorhexidine  15 mL Mouth/Throat BID   Continuous Infusions: . sodium chloride 10 mL/hr at 10/22/19 1623     LOS: 12 days    Time spent: 15 minutes    Sidney Ace, MD [facility] Pager [phone]  If 7PM-7AM, please contact night-coverage 10/31/2019, 10:42 AM

## 2019-10-31 NOTE — Telephone Encounter (Signed)
I cancelled patient's appointment for Monday. Pt is still in [facility] and she is going to hospice per caregiver.

## 2019-10-31 NOTE — Plan of Care (Signed)
  Problem: Pain Managment: Goal: General experience of comfort will improve Outcome: Progressing   Problem: Safety: Goal: Ability to remain free from injury will improve Outcome: Progressing   Problem: Activity: Goal: Ability to tolerate increased activity will improve Outcome: Progressing   Problem: Clinical Measurements: Goal: Ability to maintain a body temperature in the normal range will improve Outcome: Progressing   Problem: Respiratory: Goal: Ability to maintain adequate ventilation will improve Outcome: Progressing Goal: Ability to maintain a clear airway will improve Outcome: Progressing

## 2019-10-31 NOTE — Telephone Encounter (Signed)
FYI

## 2019-10-31 NOTE — TOC Progression Note (Signed)
Transition of Care [facility]) - Progression Note    Patient Details  Name: Kathy Mckay MRN: [id number] Date of Birth: 1932-04-15  Transition of Care [facility]) CM/SW [place], LCSW Phone Number: 10/31/2019, 2:09 PM  Clinical Narrative:   Still waiting on Hospice Home bed, patient on waitlist. No beds today per Hospice Liaison.          Expected Discharge Plan and Services           Expected Discharge Date: 10/28/19                                     Social Determinants of Health (SDOH) Interventions    Readmission Risk Interventions No flowsheet data found.

## 2019-10-31 NOTE — Progress Notes (Signed)
[facility] [facility]) Hospital Liaison RN note:  Martin Majestic to room to visit with patient. She is sleeping quietly in the bed.  Hospice Home does not have a bed available to offer today. Hospital care team is aware. [place] Liaison will follow for room availability.   Please call with any hospice related questions or concerns.  Thank you. Margaretmary Eddy, BSN, RN [facility] Liaison [phone]

## 2019-11-01 DIAGNOSIS — J189 Pneumonia, unspecified organism: Secondary | ICD-10-CM | POA: Diagnosis not present

## 2019-11-01 NOTE — TOC Progression Note (Addendum)
Transition of Care [facility]) - Progression Note    Patient Details  Name: Kathy Mckay MRN: [id number] Date of Birth: 1932-08-25  Transition of Care [facility]) CM/SW [place], LCSW Phone Number: 11/01/2019, 11:51 AM  Clinical Narrative:   Received update from [place] that patient is no longer Hospice Home appropriate, consider returning to group home with hospice.   Call to [facility] [facility] and [facility] about patient improving, no longer hospice home appropriate. Asked if she can go back to the [facility]. She reported patient can return if she is walking (walks with walker at baseline per [facility]) and if her pneumonia is better. CSW reached out to MD and RN about this, MD ordering PT eval. Handoff for weekend TOC to follow-up with [facility] and Guardian after PT evaluates.         Expected Discharge Plan and Services           Expected Discharge Date: 10/28/19                                     Social Determinants of Health (SDOH) Interventions    Readmission Risk Interventions No flowsheet data found.

## 2019-11-01 NOTE — Progress Notes (Signed)
[facility]) Hospital Liaison RN note:  Visited patient in room. She is alert and sitting up in bed. She has been eating 50 to 75% of meals. She has improved since admission and she is no longer appropriate for inpatient hospice home care. Information shared with TOC Meagan who will outreach to patient's home care facility to let them know and determine if they will take her back possibly with our hospice services or palliative to follow. [place] Liaison will continue to follow patient for disposition.   Thank you.   Zandra Abts, RN [place] [facility] Liaison [phone]

## 2019-11-01 NOTE — Plan of Care (Signed)
  Problem: Pain Managment: Goal: General experience of comfort will improve Outcome: Progressing   Problem: Safety: Goal: Ability to remain free from injury will improve Outcome: Progressing   Problem: Activity: Goal: Ability to tolerate increased activity will improve Outcome: Progressing

## 2019-11-01 NOTE — Progress Notes (Signed)
PT Cancellation Note  Patient Details Name: Kathy Mckay MRN: [id number] DOB: 1932-01-23   Cancelled Treatment:    Reason Eval/Treat Not Completed: Medical issues which prohibited therapy (Per chart review, patient currently comfort care, awaiting bed availability at hospice home.  Not appropriate for PT intervention at this time.  Will complete initial order; please reconsult should goals of care change.)   Mirtha Jain H. Owens Shark, PT, DPT, NCS 11/01/19, 10:07 PM [phone]

## 2019-11-01 NOTE — Telephone Encounter (Signed)
noted 

## 2019-11-01 NOTE — Progress Notes (Signed)
PROGRESS NOTE    Kathy Mckay  IOE:[id number] DOB: 03/03/1932 DOA: 10/19/2019 PCP: Burnard Hawthorne, FNP  Brief Narrative:  86 year old lady prior history of hypertension, hyperlipidemia, depression, breast cancer, mental retardation, pernicious anemia, thrombocytopenia presents to ED with shortness of breath and cough. Chest x-ray showed bilateral patchy infiltrates left worse than the right. She was admitted for evaluation and management of healthcare associated pneumonia. Patient is from group home and is a ward of the state and has a legal guardian.    CT chest with contrast done on 10/21/2019 showed Mild bilateral posterior basilar subsegmental atelectasis or infiltrates are noted. Mild left upper lobe opacity is noted concerning for pneumonia or atelectasis. 1.5 cm rounded hypodensity is noted in posterior portion of right hepatic lobe which appears to be enlarged compared to prior exam, concerning for worsening metastatic lesion. CT abd and pelvis with contrast done on 10/21/2019 shows multiplehypodense lesions of the liver parenchyma, which are new and enlarged compared to prior examination, consistent with worsened hepatic metastatic disease. Seen by palliative care team, legal guardianship made decision for comfort care only on 10/22/2019.  10/22: Pending hospice home bed availability.    Assessment & Plan:   Principal Problem:   HCAP (healthcare-associated pneumonia) Active Problems:   Hypertension   Depression   Gastroesophageal reflux disease   Malignant neoplasm of left breast in female, estrogen receptor positive ([place])   Sepsis ([place])   Thrombocytopenia ([place])   Hypokalemia  Assessment: -Sepsis secondary to HCAP, POA -Right hepatic lobe lesion measuring 1.5 cm suspicious for metastatic hepatocellular carcinoma -Breast cancer on Ibrance and tamoxifen -DNR with comfort care. -Essential hypertension -Severe protein calorie malnutrition -Thrombocytopenia,  chronic -GERD -Hypokalemia -Failure to thrive in an adult -Cognitive deficits  Plan: All goals directed at comfort care only.    DVT prophylaxis: Comfort Code Status: DNR Family Communication: None  disposition Plan: Status is: Inpatient  Remains inpatient appropriate because:Unsafe d/c plan   Dispo:  Patient From: Group Home  Planned Disposition: Residential Hospice  Expected discharge date: 11/01/19  Medically stable for discharge: Yes   As of this date no inpatient hospice beds available.  Hospice liaison following   Consultants:   Palliative care  Procedures:   None  Antimicrobials:   None   Subjective: Seen and examined.  Patient with no complaints.  Appears frail.  Laying in bed.   Objective: Vitals:   10/30/19 0915 10/30/19 1547 10/31/19 0726 11/01/19 0842  BP: 139/75 109/66 133/68 126/87  Pulse: 69 79 72 81  Resp:  18 20 18   Temp: 97.7 F (36.5 C) 98.2 F (36.8 C) 98 F (36.7 C) 99.8 F (37.7 C)  TempSrc: Oral  Oral Oral  SpO2: 95% 97% 98% 97%  Weight:      Height:        Intake/Output Summary (Last 24 hours) at 11/01/2019 0928 Last data filed at 10/31/2019 1900 Gross per 24 hour  Intake 180 ml  Output --  Net 180 ml   Filed Weights   10/19/19 0929  Weight: 40.8 kg    Examination:  Limited exam due to comfort measure status  General exam: No acute distress.  Appears frail and chronically ill Respiratory system: Clear to auscultation. Respiratory effort normal. Cardiovascular system: S1-S2 heard.  No murmurs.    Data Reviewed: I have personally reviewed following labs and imaging studies  CBC: No results for input(s): WBC, NEUTROABS, HGB, HCT, MCV, PLT in the last 168 hours. Basic Metabolic Panel: No results  for input(s): NA, K, CL, CO2, GLUCOSE, BUN, CREATININE, CALCIUM, MG, PHOS in the last 168 hours. GFR: Estimated Creatinine Clearance: 31.3 mL/min (by C-G formula based on SCr of 0.73 mg/dL). Liver Function  Tests: No results for input(s): AST, ALT, ALKPHOS, BILITOT, PROT, ALBUMIN in the last 168 hours. No results for input(s): LIPASE, AMYLASE in the last 168 hours. No results for input(s): AMMONIA in the last 168 hours. Coagulation Profile: No results for input(s): INR, PROTIME in the last 168 hours. Cardiac Enzymes: No results for input(s): CKTOTAL, CKMB, CKMBINDEX, TROPONINI in the last 168 hours. BNP (last 3 results) No results for input(s): PROBNP in the last 8760 hours. HbA1C: No results for input(s): HGBA1C in the last 72 hours. CBG: No results for input(s): GLUCAP in the last 168 hours. Lipid Profile: No results for input(s): CHOL, HDL, LDLCALC, TRIG, CHOLHDL, LDLDIRECT in the last 72 hours. Thyroid Function Tests: No results for input(s): TSH, T4TOTAL, FREET4, T3FREE, THYROIDAB in the last 72 hours. Anemia Panel: No results for input(s): VITAMINB12, FOLATE, FERRITIN, TIBC, IRON, RETICCTPCT in the last 72 hours. Sepsis Labs: No results for input(s): PROCALCITON, LATICACIDVEN in the last 168 hours.  No results found for this or any previous visit (from the past 240 hour(s)).       Radiology Studies: No results found.      Scheduled Meds: . chlorhexidine  15 mL Mouth/Throat BID   Continuous Infusions: . sodium chloride 10 mL/hr at 10/22/19 1623     LOS: 13 days    Time spent: 15 minutes    Sidney Ace, MD [facility] Pager [phone]  If 7PM-7AM, please contact night-coverage 11/01/2019, 9:28 AM

## 2019-11-02 DIAGNOSIS — J189 Pneumonia, unspecified organism: Secondary | ICD-10-CM | POA: Diagnosis not present

## 2019-11-02 NOTE — Progress Notes (Signed)
PROGRESS NOTE    Kathy Mckay  HYW:[id number] DOB: 04/12/1932 DOA: 10/19/2019 PCP: Burnard Hawthorne, FNP  Brief Narrative:  86 year old lady prior history of hypertension, hyperlipidemia, depression, breast cancer, mental retardation, pernicious anemia, thrombocytopenia presents to ED with shortness of breath and cough. Chest x-ray showed bilateral patchy infiltrates left worse than the right. She was admitted for evaluation and management of healthcare associated pneumonia. Patient is from group home and is a ward of the state and has a legal guardian.    CT chest with contrast done on 10/21/2019 showed Mild bilateral posterior basilar subsegmental atelectasis or infiltrates are noted. Mild left upper lobe opacity is noted concerning for pneumonia or atelectasis. 1.5 cm rounded hypodensity is noted in posterior portion of right hepatic lobe which appears to be enlarged compared to prior exam, concerning for worsening metastatic lesion. CT abd and pelvis with contrast done on 10/21/2019 shows multiplehypodense lesions of the liver parenchyma, which are new and enlarged compared to prior examination, consistent with worsened hepatic metastatic disease. Seen by palliative care team, legal guardianship made decision for comfort care only on 10/22/2019.  10/22: Pending hospice home bed availability. 10/23: Patient was reevaluated by hospice liaison and found no longer to be appropriate for inpatient hospice home care.  Attempts to get patient back to her home care facility however they are requesting a repeat physical therapy evaluation in order to assess patient's ambulatory status.  If she is able to ambulate with use of assistant walker will discharge back to care facility with outpatient palliative following.    Assessment & Plan:   Principal Problem:   HCAP (healthcare-associated pneumonia) Active Problems:   Hypertension   Depression   Gastroesophageal reflux disease   Malignant  neoplasm of left breast in female, estrogen receptor positive ([place])   Sepsis ([place])   Thrombocytopenia ([place])   Hypokalemia  Assessment: -Sepsis secondary to HCAP, POA -Right hepatic lobe lesion measuring 1.5 cm suspicious for metastatic hepatocellular carcinoma -Breast cancer on Ibrance and tamoxifen -DNR with comfort care. -Essential hypertension -Severe protein calorie malnutrition -Thrombocytopenia, chronic -GERD -Hypokalemia -Failure to thrive in an adult -Cognitive deficits  Plan: Patient no longer appropriate for inpatient home hospice care.  Will attempt to return back to previous care facility however requesting repeat physical therapy evaluation in order to determine ambulatory status.  Patient's pneumonia has improved.  She is no longer febrile.  She is not requiring any oxygen.  Stable from my standpoint back to assisted living facility once physical therapy evaluation is complete.    DVT prophylaxis: Comfort Code Status: DNR Family Communication: None  disposition Plan: Status is: Inpatient  Remains inpatient appropriate because:Unsafe d/c plan   Dispo:  Patient From: Group Home  Planned Disposition: Group Home  Expected discharge date: 11/02/19  Medically stable for discharge: Yes  Patient is medically stable for discharge back to care home.  No longer appropriate for inpatient hospice home care.  Patient is tolerating 50 to 70% of meals.  She is afebrile.  She is not requiring any supplemental oxygen.  Physical therapy reevaluation requested to assess ambulatory status.        Consultants:   Palliative care  Procedures:   None  Antimicrobials:   None   Subjective: Seen and examined.  Patient with no complaints.  Appears frail.  Laying in bed.   Objective: Vitals:   10/31/19 0726 11/01/19 0842 11/02/19 0541 11/02/19 0720  BP: 133/68 126/87 126/78 122/75  Pulse: 72 81 79 69  Resp: 20 18 15 16   Temp: 98 F (36.7 C) 99.8 F (37.7 C) 97.9  F (36.6 C) 97.9 F (36.6 C)  TempSrc: Oral Oral Oral Axillary  SpO2: 98% 97% 100% 99%  Weight:      Height:        Intake/Output Summary (Last 24 hours) at 11/02/2019 1001 Last data filed at 11/01/2019 1438 Gross per 24 hour  Intake 240 ml  Output --  Net 240 ml   Filed Weights   10/19/19 0929  Weight: 40.8 kg    Examination:  Limited exam due to comfort measure status  General exam: No acute distress.  Appears frail and chronically ill Respiratory system: Clear to auscultation. Respiratory effort normal. Cardiovascular system: S1-S2 heard.  No murmurs.    Data Reviewed: I have personally reviewed following labs and imaging studies  CBC: No results for input(s): WBC, NEUTROABS, HGB, HCT, MCV, PLT in the last 168 hours. Basic Metabolic Panel: No results for input(s): NA, K, CL, CO2, GLUCOSE, BUN, CREATININE, CALCIUM, MG, PHOS in the last 168 hours. GFR: Estimated Creatinine Clearance: 31.3 mL/min (by C-G formula based on SCr of 0.73 mg/dL). Liver Function Tests: No results for input(s): AST, ALT, ALKPHOS, BILITOT, PROT, ALBUMIN in the last 168 hours. No results for input(s): LIPASE, AMYLASE in the last 168 hours. No results for input(s): AMMONIA in the last 168 hours. Coagulation Profile: No results for input(s): INR, PROTIME in the last 168 hours. Cardiac Enzymes: No results for input(s): CKTOTAL, CKMB, CKMBINDEX, TROPONINI in the last 168 hours. BNP (last 3 results) No results for input(s): PROBNP in the last 8760 hours. HbA1C: No results for input(s): HGBA1C in the last 72 hours. CBG: No results for input(s): GLUCAP in the last 168 hours. Lipid Profile: No results for input(s): CHOL, HDL, LDLCALC, TRIG, CHOLHDL, LDLDIRECT in the last 72 hours. Thyroid Function Tests: No results for input(s): TSH, T4TOTAL, FREET4, T3FREE, THYROIDAB in the last 72 hours. Anemia Panel: No results for input(s): VITAMINB12, FOLATE, FERRITIN, TIBC, IRON, RETICCTPCT in the last  72 hours. Sepsis Labs: No results for input(s): PROCALCITON, LATICACIDVEN in the last 168 hours.  No results found for this or any previous visit (from the past 240 hour(s)).       Radiology Studies: No results found.      Scheduled Meds: . chlorhexidine  15 mL Mouth/Throat BID   Continuous Infusions: . sodium chloride 10 mL/hr at 10/22/19 1623     LOS: 14 days    Time spent: 15 minutes    Sidney Ace, MD [facility] Pager [phone]  If 7PM-7AM, please contact night-coverage 11/02/2019, 10:01 AM

## 2019-11-02 NOTE — TOC Progression Note (Signed)
Transition of Care [facility]) - Progression Note    Patient Details  Name: Kathy Mckay MRN: [id number] Date of Birth: [birth year]  Transition of Care [facility]) CM/SW Contact  Boris Sharper, LCSW Phone Number: 11/02/2019, 3:08 PM  Clinical Narrative:   PT notified CSW that pt stood and took about 4-5 steps a along the side of the bed  +1 mod assist for safety. PT feels that pt will do better in the group home, where she is familiar with her surroundings. SW contacted pt;s family care home and [name] stated that pt has to be independent with RW assistance only in order to come back. CSW began SNF work up and faxed to surrounding facilities, [place] updated pt's legal Guardian with a voicemail.     Expected Discharge Plan: [place] Barriers to Discharge: Continued Medical Work up, No SNF bed  Expected Discharge Plan and Services Expected Discharge Plan: [place] Choice: [place] arrangements for the past 2 months: [place] [facility] and [facility]) Expected Discharge Date: 10/28/19                                     Social Determinants of Health (SDOH) Interventions    Readmission Risk Interventions No flowsheet data found.

## 2019-11-02 NOTE — NC FL2 (Signed)
[place] LEVEL OF CARE SCREENING TOOL     IDENTIFICATION  Patient Name: Kathy Mckay Birthdate: March 16, 1932 Sex: female Admission Date (Current Location): 10/19/2019  [place] and [place] Number:  Engineering geologist and Address:  [facility], [address], [place], [place] [address]      Provider Number: [id number]  Attending Physician Name and Address:  Sidney Ace, MD  Relative Name and Phone Number:  Beryle Beams [phone]    Current Level of Care: Hospital Recommended Level of Care: [place] Prior Approval Number:    Date Approved/Denied:   PASRR Number:  (Pending)  Discharge Plan: SNF    Current Diagnoses: Patient Active Problem List   Diagnosis Date Noted  . HCAP (healthcare-associated pneumonia) 10/19/2019  . Sepsis ([place]) 10/19/2019  . Thrombocytopenia ([place]) 10/19/2019  . Hypokalemia 10/19/2019  . Kyphosis of cervicothoracic region 09/20/2017  . Malignant neoplasm of left breast in female, estrogen receptor positive ([place]) 07/28/2017  . Dysuria 08/03/2016  . Tachycardia 08/03/2016  . Elevated alkaline phosphatase level 04/27/2016  . Gastroesophageal reflux disease 04/27/2016  . Screening for breast cancer 10/28/2015  . Medicare annual wellness visit, subsequent 03/05/2014  . Other and unspecified hyperlipidemia 11/19/2012  . Urinary incontinence 03/27/2012  . Osteoarthritis 12/26/2011  . Osteoporosis 03/15/2011  . Depression 03/01/2011  . Pernicious anemia 10/20/2010  . Hypertension 10/20/2010  . Hypothyroidism 10/20/2010  . Gait disturbance 10/20/2010  . Developmental delay 10/20/2010    Orientation RESPIRATION BLADDER Height & Weight      (Disoriented x4)  Normal Incontinent Weight: 90 lb (40.8 kg) Height:  4\' 9"  (144.8 cm)  BEHAVIORAL SYMPTOMS/MOOD NEUROLOGICAL BOWEL NUTRITION STATUS      Incontinent    AMBULATORY STATUS COMMUNICATION OF NEEDS Skin   Extensive Assist  Verbally Normal                       Personal Care Assistance Level of Assistance  Bathing, Feeding, Dressing Bathing Assistance: Maximum assistance Feeding assistance: Limited assistance Dressing Assistance: Maximum assistance     Functional Limitations Info  Sight, Hearing, Speech Sight Info: Adequate Hearing Info: Adequate Speech Info: Adequate    SPECIAL CARE FACTORS FREQUENCY  PT (By licensed PT), OT (By licensed OT)     PT Frequency: 5x week OT Frequency: 5x week            Contractures Contractures Info: Not present    Additional Factors Info  Code Status Code Status Info: DNR             Current Medications (11/02/2019):  This is the current hospital active medication list Current Facility-Administered Medications  Medication Dose Route Frequency Provider Last Rate Last Admin  . 0.9 %  sodium chloride infusion   Intravenous Continuous Pickenpack-Cousar, Athena N, NP 10 mL/hr at 10/22/19 1623 Rate Change at 10/22/19 1623  . acetaminophen (TYLENOL) tablet 650 mg  650 mg Oral Q6H PRN Hosie Poisson, MD      . antiseptic oral rinse (BIOTENE) solution 15 mL  15 mL Topical PRN Pickenpack-Cousar, Athena N, NP      . chlorhexidine (PERIDEX) 0.12 % solution 15 mL  15 mL Mouth/Throat BID Hall, Carole N, DO   15 mL at 11/02/19 1121  . dextromethorphan-guaiFENesin (MUCINEX DM) 30-600 MG per 12 hr tablet 1 tablet  1 tablet Oral BID PRN Hosie Poisson, MD   1 tablet at 10/20/19 0518  . glycopyrrolate (ROBINUL) injection 0.2 mg  0.2 mg Intravenous Q4H PRN Pickenpack-Cousar, Athena N, NP      . haloperidol lactate (HALDOL) injection 0.5 mg  0.5 mg Intramuscular Q4H PRN Lang Snow, FNP   0.5 mg at 10/30/19 0324  . ipratropium-albuterol (DUONEB) 0.5-2.5 (3) MG/3ML nebulizer solution 3 mL  3 mL Nebulization Q4H PRN Hosie Poisson, MD      . morphine 2 MG/ML injection 1 mg  1 mg Intravenous Q2H PRN Pickenpack-Cousar, Carlena Sax, NP      . Muscle Rub CREA   Topical Q6H PRN  Hosie Poisson, MD      . ondansetron (ZOFRAN) tablet 4 mg  4 mg Oral Q6H PRN Hosie Poisson, MD      . polyethylene glycol powder (GLYCOLAX/MIRALAX) container 17 g  17 g Oral Daily PRN Ivor Costa, MD      . polyvinyl alcohol (LIQUIFILM TEARS) 1.4 % ophthalmic solution 1 drop  1 drop Both Eyes QID PRN Pickenpack-Cousar, Carlena Sax, NP         Discharge Medications: Please see discharge summary for a list of discharge medications.  Relevant Imaging Results:  Relevant Lab Results:   Additional Information SS: [id number]  Boris Sharper, LCSW

## 2019-11-02 NOTE — Evaluation (Signed)
Physical Therapy Evaluation Patient Details Name: Kathy Mckay MRN: [id number] DOB: 1932/04/17 Today's Date: 11/02/2019   History of Present Illness  Kathy Mckay is a 86 year old female with PMH of HTN, hyperlipidemia, depression, breast cancer, mental retardation, pernicious anemia, and thrombocytopenia. She presents to ED with shortness of breath and cough and admitted for healthcare associated pneumonia. She is from a group home and is ward of the state.  Initially transitioned to comfort care, but has improved clinically and is no longer inpatient hospice appropriate.  Clinical Impression  Patient sleeping in bed upon arrival to room; easily awakens to voice.  Oriented to self only; exceptionally HOH. Baseline cognitive deficits evident.  Does participate well and follow requests during session; optimal performance with hand-over-hand assist from therapist for all functional activities.  Bilat UE/LE strength and ROM appear grossly 3+ to 4-/5 throughout; no focal weakness appreciated.  Able to complete bed mobility with min/mod assist; static sitting balance with close sup; sit/stand, basic transfers and gait with RW, min/mod assist.  Demonstrates lateral stepping edge of bed, min/mod assist for balance; max assist to walker position/management to guide walker, initiate movement.  Consistent encouragement, redirection to task at hand (patient consistently attempting to return to supine) due to limited comprehension of task requested.  Do anticipate need for RW and +1 with all mobility tasks at this time. Would benefit from skilled PT to address above deficits and promote optimal return to PLOF; recommend transition to STR upon discharge from acute hospitalization if group home unable to manage current level of care (+1 with RW).  However, if group home able to manage, feel patient may perform and progress optimally in familiar environment with familiar caregivers.     Follow Up Recommendations  SNF    Equipment Recommendations  Rolling walker with 5" wheels    Recommendations for Other Services       Precautions / Restrictions Precautions Precautions: Fall Restrictions Weight Bearing Restrictions: No      Mobility  Bed Mobility Overal bed mobility: Needs Assistance Bed Mobility: Supine to Sit;Sit to Supine     Supine to sit: Min assist;Mod assist Sit to supine: Min assist   General bed mobility comments: patient generally perseverative on wanting to "lay down" and "not having to go anywhere"    Transfers Overall transfer level: Needs assistance Equipment used: Rolling walker (2 wheeled) Transfers: Sit to/from Stand Sit to Stand: Min assist;Mod assist         General transfer comment: hand-over-hand assist to initiate movement transition; min/mod assist for midline orientation and standing balance (tendancy towards posterior LOB)  Ambulation/Gait Ambulation/Gait assistance: Min assist;Mod assist Gait Distance (Feet): 5 Feet Assistive device: Rolling walker (2 wheeled)       General Gait Details: lateral stepping edge of bed, min/mod assist for balance; max assist to walker position/management to guide walker, initiate movement.  Consistent encouragement, redirection to task at hand (patient consistently attempting to return to supine) due to limited comprehension of task requested.  Stairs            Wheelchair Mobility    Modified Rankin (Stroke Patients Only)       Balance Overall balance assessment: Needs assistance Sitting-balance support: No upper extremity supported;Feet supported Sitting balance-Leahy Scale: Fair     Standing balance support: Bilateral upper extremity supported Standing balance-Leahy Scale: Poor Standing balance comment: +1 and RW at all times with standing  Pertinent Vitals/Pain Pain Assessment: Faces Faces Pain Scale: No hurt    Home Living                    Additional Comments: Baseline resident of group home, ambulatory with RW.    Prior Function Level of Independence: Independent with assistive device(s)         Comments: Per previous documentation, "according to group home administrator pt walks Mod I with RW throughout the group home. She is indepdent with all bed mobility, but needs assistance with changing clothes and bathing". She cannot read or write and is HOH. Her default answer when she can't here is "I'm alright".     Hand Dominance        Extremity/Trunk Assessment   Upper Extremity Assessment Upper Extremity Assessment: Generalized weakness (grossly 3+ to 4-/5 throughout; no focal weakness apparent)    Lower Extremity Assessment Lower Extremity Assessment: Generalized weakness (grossly 3+ to 4-/5 throughout; no focal weakness apparent.  Supports body weight without buckling; mild R LE ER in sitting/standing positions.)    Cervical / Trunk Assessment Cervical / Trunk Assessment: Kyphotic  Communication   Communication: HOH  Cognition Arousal/Alertness: Awake/alert Behavior During Therapy: Flat affect Overall Cognitive Status: History of cognitive impairments - at baseline                                 General Comments: oriented to self; limited ability to follow commands (due to [facility], baseline cognitive deficits), but does respond well to hand-over-hand guidance from therapist.      General Comments      Exercises     Assessment/Plan    PT Assessment Patient needs continued PT services  PT Problem List Decreased strength;Decreased range of motion;Decreased activity tolerance;Decreased balance;Decreased mobility;Decreased coordination;Decreased cognition;Decreased safety awareness;Decreased skin integrity       PT Treatment Interventions Gait training;DME instruction;Stair training;Functional mobility training;Therapeutic activities;Therapeutic exercise;Balance training;Neuromuscular  re-education;Patient/family education;Cognitive remediation    PT Goals (Current goals can be found in the Care Plan section)  Acute Rehab PT Goals Patient Stated Goal: "to lay down" PT Goal Formulation: Patient unable to participate in goal setting Time For Goal Achievement: 11/16/19 Potential to Achieve Goals: Fair    Frequency Min 2X/week   Barriers to discharge        Co-evaluation               AM-PAC PT "6 Clicks" Mobility  Outcome Measure Help needed turning from your back to your side while in a flat bed without using bedrails?: A Little Help needed moving from lying on your back to sitting on the side of a flat bed without using bedrails?: A Little Help needed moving to and from a bed to a chair (including a wheelchair)?: A Lot Help needed standing up from a chair using your arms (e.g., wheelchair or bedside chair)?: A Lot Help needed to walk in hospital room?: A Lot Help needed climbing 3-5 steps with a railing? : A Lot 6 Click Score: 14    End of Session   Activity Tolerance: Patient tolerated treatment well Patient left: in bed;with call bell/phone within reach;with bed alarm set Nurse Communication: Mobility status PT Visit Diagnosis: Unsteadiness on feet (R26.81);Other abnormalities of gait and mobility (R26.89);Repeated falls (R29.6);Muscle weakness (generalized) (M62.81);History of falling (Z91.81)    Time: 7209-4709 PT Time Calculation (min) (ACUTE ONLY): 10 min   Charges:   PT  Evaluation $PT Re-evaluation: 1 Re-eval          Darcelle Herrada H. Owens Shark, PT, DPT, NCS 11/02/19, 11:06 AM [phone]

## 2019-11-03 DIAGNOSIS — J189 Pneumonia, unspecified organism: Secondary | ICD-10-CM | POA: Diagnosis not present

## 2019-11-03 NOTE — Progress Notes (Signed)
PROGRESS NOTE    Kathy Mckay  XIP:[id number] DOB: 1932-08-02 DOA: 10/19/2019 PCP: Burnard Hawthorne, FNP  Brief Narrative:  86 year old lady prior history of hypertension, hyperlipidemia, depression, breast cancer, mental retardation, pernicious anemia, thrombocytopenia presents to ED with shortness of breath and cough. Chest x-ray showed bilateral patchy infiltrates left worse than the right. She was admitted for evaluation and management of healthcare associated pneumonia. Patient is from group home and is a ward of the state and has a legal guardian.    CT chest with contrast done on 10/21/2019 showed Mild bilateral posterior basilar subsegmental atelectasis or infiltrates are noted. Mild left upper lobe opacity is noted concerning for pneumonia or atelectasis. 1.5 cm rounded hypodensity is noted in posterior portion of right hepatic lobe which appears to be enlarged compared to prior exam, concerning for worsening metastatic lesion. CT abd and pelvis with contrast done on 10/21/2019 shows multiplehypodense lesions of the liver parenchyma, which are new and enlarged compared to prior examination, consistent with worsened hepatic metastatic disease. Seen by palliative care team, legal guardianship made decision for comfort care only on 10/22/2019.  10/22: Pending hospice home bed availability. 10/23: Patient was reevaluated by hospice liaison and found no longer to be appropriate for inpatient hospice home care.  Attempts to get patient back to her home care facility however they are requesting a repeat physical therapy evaluation in order to assess patient's ambulatory status.  If she is able to ambulate with use of assistant walker will discharge back to care facility with outpatient palliative following. 10/24: Patient ambulatory but not to the level that would allow her to return to group home.  Recommend skilled nursing facility placement.  TOC involved.    Assessment & Plan:    Principal Problem:   HCAP (healthcare-associated pneumonia) Active Problems:   Hypertension   Depression   Gastroesophageal reflux disease   Malignant neoplasm of left breast in female, estrogen receptor positive ([place])   Sepsis ([place])   Thrombocytopenia ([place])   Hypokalemia  Assessment: -Sepsis secondary to HCAP, POA -Right hepatic lobe lesion measuring 1.5 cm suspicious for metastatic hepatocellular carcinoma -Breast cancer on Ibrance and tamoxifen -DNR with comfort care. -Essential hypertension -Severe protein calorie malnutrition -Thrombocytopenia, chronic -GERD -Hypokalemia -Failure to thrive in an adult -Cognitive deficits  Plan: Patient is no longer appropriate for inpatient home hospice care.  Attempts to return to group home have been unsuccessful.  Patient is ambulatory but not to the level that they require.  TOC consult for skilled nursing facility placement.  Patient stable for discharge once skilled nursing facility bed is found.    DVT prophylaxis: Comfort Code Status: DNR Family Communication: None  disposition Plan: Status is: Inpatient  Remains inpatient appropriate because:Unsafe d/c plan   Dispo:  Patient From: Group Home  Planned Disposition: Group Home  Expected discharge date: 11/02/19  Medically stable for discharge: Yes  Patient is medically stable for discharge back to care home.  No longer appropriate for inpatient hospice home care.  Patient is tolerating 50 to 70% of meals.  She is afebrile.  She is not requiring any supplemental oxygen.  Physical therapy evaluated however patient is nonambulatory to the level to group home requires.  Plan for skilled nursing facility placement.  TOC involved.  Stable for discharge once skilled nursing facility bed is found.        Consultants:   Palliative care  Procedures:   None  Antimicrobials:   None   Subjective: Seen and  examined.  Pleasant, communicative.  No  complaints. Objective: Vitals:   11/02/19 0541 11/02/19 0720 11/03/19 0009 11/03/19 0742  BP: 126/78 122/75 (!) 134/91 121/63  Pulse: 79 69 74 80  Resp: 15 16 15 16   Temp: 97.9 F (36.6 C) 97.9 F (36.6 C) (!) 97.5 F (36.4 C) 98 F (36.7 C)  TempSrc: Oral Axillary Oral Oral  SpO2: 100% 99% 96% 97%  Weight:      Height:       No intake or output data in the 24 hours ending 11/03/19 0930 Filed Weights   10/19/19 0929  Weight: 40.8 kg    Examination:  Limited exam due to comfort measure status  General exam: No acute distress.  Appears frail and chronically ill Respiratory system: Clear to auscultation. Respiratory effort normal. Cardiovascular system: S1-S2 heard.  No murmurs.    Data Reviewed: I have personally reviewed following labs and imaging studies  CBC: No results for input(s): WBC, NEUTROABS, HGB, HCT, MCV, PLT in the last 168 hours. Basic Metabolic Panel: No results for input(s): NA, K, CL, CO2, GLUCOSE, BUN, CREATININE, CALCIUM, MG, PHOS in the last 168 hours. GFR: Estimated Creatinine Clearance: 31.3 mL/min (by C-G formula based on SCr of 0.73 mg/dL). Liver Function Tests: No results for input(s): AST, ALT, ALKPHOS, BILITOT, PROT, ALBUMIN in the last 168 hours. No results for input(s): LIPASE, AMYLASE in the last 168 hours. No results for input(s): AMMONIA in the last 168 hours. Coagulation Profile: No results for input(s): INR, PROTIME in the last 168 hours. Cardiac Enzymes: No results for input(s): CKTOTAL, CKMB, CKMBINDEX, TROPONINI in the last 168 hours. BNP (last 3 results) No results for input(s): PROBNP in the last 8760 hours. HbA1C: No results for input(s): HGBA1C in the last 72 hours. CBG: No results for input(s): GLUCAP in the last 168 hours. Lipid Profile: No results for input(s): CHOL, HDL, LDLCALC, TRIG, CHOLHDL, LDLDIRECT in the last 72 hours. Thyroid Function Tests: No results for input(s): TSH, T4TOTAL, FREET4, T3FREE, THYROIDAB in  the last 72 hours. Anemia Panel: No results for input(s): VITAMINB12, FOLATE, FERRITIN, TIBC, IRON, RETICCTPCT in the last 72 hours. Sepsis Labs: No results for input(s): PROCALCITON, LATICACIDVEN in the last 168 hours.  No results found for this or any previous visit (from the past 240 hour(s)).       Radiology Studies: No results found.      Scheduled Meds:  chlorhexidine  15 mL Mouth/Throat BID   Continuous Infusions:  sodium chloride 10 mL/hr at 10/22/19 1623     LOS: 15 days    Time spent: 15 minutes    Sidney Ace, MD [facility] Pager [phone]  If 7PM-7AM, please contact night-coverage 11/03/2019, 9:30 AM

## 2019-11-03 NOTE — Plan of Care (Signed)
Pt Alert to self & disoriented to time/place and situation. Pt Hourly rounding reveals patient in room. No complaints, stable, in no acute distress with equal unlabored RR. Nothing needed from staff at this time. Pt clean and dry and repositioned supine. Fall alarm turned on bed, with door open for better visualization. Will continue to assess as needed.

## 2019-11-04 ENCOUNTER — Inpatient Hospital Stay: Payer: Medicare Other | Admitting: Family

## 2019-11-04 NOTE — Plan of Care (Signed)
No acute events this shift. Problem: Pain Managment: Goal: General experience of comfort will improve Outcome: Progressing   Problem: Safety: Goal: Ability to remain free from injury will improve Outcome: Progressing   Problem: Activity: Goal: Ability to tolerate increased activity will improve Outcome: Progressing   Problem: Clinical Measurements: Goal: Ability to maintain a body temperature in the normal range will improve Outcome: Progressing   Problem: Respiratory: Goal: Ability to maintain adequate ventilation will improve Outcome: Progressing Goal: Ability to maintain a clear airway will improve Outcome: Progressing   Problem: Coping: Goal: Coping ability will improve Outcome: Progressing Goal: Will verbalize feelings Outcome: Progressing   Problem: Health Behavior/Discharge Planning: Goal: Ability to make decisions will improve Outcome: Progressing Goal: Compliance with therapeutic regimen will improve Outcome: Progressing   Problem: Role Relationship: Goal: Will demonstrate positive changes in social behaviors and relationships Outcome: Progressing   Problem: Safety: Goal: Ability to disclose and discuss suicidal ideas will improve Outcome: Progressing Goal: Ability to identify and utilize support systems that promote safety will improve Outcome: Progressing   Problem: Clinical Measurements: Goal: Signs and symptoms of infection will decrease Outcome: Progressing

## 2019-11-04 NOTE — TOC Progression Note (Addendum)
Transition of Care [facility]) - Progression Note    Patient Details  Name: Kathy Mckay MRN: [id number] Date of Birth: 10/03/1932  Transition of Care [name]) CM/SW [place], LCSW Phone Number: 11/04/2019, 8:55 AM  Clinical Narrative:     Sent FL2 completed by Weekend TOC to local SNFs to evaluate. Spoke to MD and PT, patient appropriate for long-term SNF placement with hospice services. Patient does have Medicaid per chart. Will follow up for bed offers.  11:20- Left voicemail for [facility] with update and requested a return call. She will have to chose SNF.   1:05- [facility] as they made a bed offer. She reported they can offer a long-term SNF with hospice bed pending their financial review (verifying patient's Medicaid). Will wait to hear back from her and DSS Guardian.   Expected Discharge Plan: [place] Barriers to Discharge: Continued Medical Work up, No SNF bed  Expected Discharge Plan and Services Expected Discharge Plan: [place] Choice: [place] arrangements for the past 2 months: [place] [facility] and [facility]) Expected Discharge Date: 10/28/19                                     Social Determinants of Health (SDOH) Interventions    Readmission Risk Interventions No flowsheet data found.

## 2019-11-04 NOTE — Progress Notes (Signed)
[facility]) Hospital Liaison RN note:  Visited with patient in room. She is resting and asleep. Per [place], TOC, a bed offer has been made at the [facility] in [place] pending a financial review and approval by DSS Guardian. [facility] will follow her at facility.   Thank you for the opportunity to participate in this patient's care.   Zandra Abts, RN [facility] Liaison [phone]

## 2019-11-04 NOTE — Progress Notes (Signed)
PROGRESS NOTE    Kathy Mckay  ZHY:[id number] DOB: 04/24/1932 DOA: 10/19/2019 PCP: Burnard Hawthorne, FNP  Brief Narrative:  86 year old lady prior history of hypertension, hyperlipidemia, depression, breast cancer, mental retardation, pernicious anemia, thrombocytopenia presents to ED with shortness of breath and cough. Chest x-ray showed bilateral patchy infiltrates left worse than the right. She was admitted for evaluation and management of healthcare associated pneumonia. Patient is from group home and is a ward of the state and has a legal guardian.    CT chest with contrast done on 10/21/2019 showed Mild bilateral posterior basilar subsegmental atelectasis or infiltrates are noted. Mild left upper lobe opacity is noted concerning for pneumonia or atelectasis. 1.5 cm rounded hypodensity is noted in posterior portion of right hepatic lobe which appears to be enlarged compared to prior exam, concerning for worsening metastatic lesion. CT abd and pelvis with contrast done on 10/21/2019 shows multiplehypodense lesions of the liver parenchyma, which are new and enlarged compared to prior examination, consistent with worsened hepatic metastatic disease. Seen by palliative care team, legal guardianship made decision for comfort care only on 10/22/2019.  10/22: Pending hospice home bed availability. 10/23: Patient was reevaluated by hospice liaison and found no longer to be appropriate for inpatient hospice home care.  Attempts to get patient back to her home care facility however they are requesting a repeat physical therapy evaluation in order to assess patient's ambulatory status.  If she is able to ambulate with use of assistant walker will discharge back to care facility with outpatient palliative following. 10/24: Patient ambulatory but not to the level that would allow her to return to group home.  Recommend skilled nursing facility placement.  TOC involved. 10/25: Communicated with TOC  and physical therapy.  Most appropriate course of action at this point would be a long-term SNF placement with hospice services.    Assessment & Plan:   Principal Problem:   HCAP (healthcare-associated pneumonia) Active Problems:   Hypertension   Depression   Gastroesophageal reflux disease   Malignant neoplasm of left breast in female, estrogen receptor positive ([place])   Sepsis ([place])   Thrombocytopenia ([place])   Hypokalemia  Assessment: -Sepsis secondary to HCAP, POA -Right hepatic lobe lesion measuring 1.5 cm suspicious for metastatic hepatocellular carcinoma -Breast cancer on Ibrance and tamoxifen -DNR with comfort care. -Essential hypertension -Severe protein calorie malnutrition -Thrombocytopenia, chronic -GERD -Hypokalemia -Failure to thrive in an adult -Cognitive deficits  Plan: Patient is no longer appropriate for inpatient home hospice care.  Attempts to return to group home have been unsuccessful.  Patient is ambulatory but not to the level that they require.  TOC consult for skilled nursing facility placement.  Patient propria for long-term SNF placement with hospice services.  TOC to follow-up on bed offers.  Continue supportive care in the meantime.   DVT prophylaxis: Comfort Code Status: DNR Family Communication: None  disposition Plan: Status is: Inpatient  Remains inpatient appropriate because:Unsafe d/c plan   Dispo:  Patient From: Group Home  Planned Disposition: Long-term skilled nursing facility with hospice services  Expected discharge date: 11/05/2019  Medically stable for discharge: Yes  Patient is medically stable for discharge back to care home.  No longer appropriate for inpatient hospice home care.  Patient is tolerating 50 to 70% of meals.  She is afebrile.  She is not requiring any supplemental oxygen.  Physical therapy evaluated however patient is nonambulatory to the level to group home requires.  Plan for skilled nursing facility  placement.  Most appropriate for long-term skilled nursing facility placement with hospice services.        Consultants:   Palliative care  Procedures:   None  Antimicrobials:   None   Subjective: Seen and examined.  Pleasant, communicative.  No complaints. Objective: Vitals:   11/03/19 0009 11/03/19 0742 11/03/19 2308 11/04/19 0752  BP: (!) 134/91 121/63 118/75 138/71  Pulse: 74 80 72 72  Resp: 15 16 19 20   Temp: (!) 97.5 F (36.4 C) 98 F (36.7 C) 97.8 F (36.6 C) 98 F (36.7 C)  TempSrc: Oral Oral Oral   SpO2: 96% 97% 98% 97%  Weight:      Height:       No intake or output data in the 24 hours ending 11/04/19 1021 Filed Weights   10/19/19 0929  Weight: 40.8 kg    Examination:  Limited exam due to comfort measure status  General exam: No acute distress.  Appears frail and chronically ill Respiratory system: Clear to auscultation. Respiratory effort normal. Cardiovascular system: S1-S2 heard.  No murmurs.    Data Reviewed: I have personally reviewed following labs and imaging studies  CBC: No results for input(s): WBC, NEUTROABS, HGB, HCT, MCV, PLT in the last 168 hours. Basic Metabolic Panel: No results for input(s): NA, K, CL, CO2, GLUCOSE, BUN, CREATININE, CALCIUM, MG, PHOS in the last 168 hours. GFR: Estimated Creatinine Clearance: 31.3 mL/min (by C-G formula based on SCr of 0.73 mg/dL). Liver Function Tests: No results for input(s): AST, ALT, ALKPHOS, BILITOT, PROT, ALBUMIN in the last 168 hours. No results for input(s): LIPASE, AMYLASE in the last 168 hours. No results for input(s): AMMONIA in the last 168 hours. Coagulation Profile: No results for input(s): INR, PROTIME in the last 168 hours. Cardiac Enzymes: No results for input(s): CKTOTAL, CKMB, CKMBINDEX, TROPONINI in the last 168 hours. BNP (last 3 results) No results for input(s): PROBNP in the last 8760 hours. HbA1C: No results for input(s): HGBA1C in the last 72  hours. CBG: No results for input(s): GLUCAP in the last 168 hours. Lipid Profile: No results for input(s): CHOL, HDL, LDLCALC, TRIG, CHOLHDL, LDLDIRECT in the last 72 hours. Thyroid Function Tests: No results for input(s): TSH, T4TOTAL, FREET4, T3FREE, THYROIDAB in the last 72 hours. Anemia Panel: No results for input(s): VITAMINB12, FOLATE, FERRITIN, TIBC, IRON, RETICCTPCT in the last 72 hours. Sepsis Labs: No results for input(s): PROCALCITON, LATICACIDVEN in the last 168 hours.  No results found for this or any previous visit (from the past 240 hour(s)).       Radiology Studies: No results found.      Scheduled Meds: . chlorhexidine  15 mL Mouth/Throat BID   Continuous Infusions: . sodium chloride 10 mL/hr at 10/22/19 1623     LOS: 16 days    Time spent: 15 minutes    Sidney Ace, MD [facility] Pager [phone]  If 7PM-7AM, please contact night-coverage 11/04/2019, 10:21 AM

## 2019-11-05 NOTE — TOC Progression Note (Signed)
Transition of Care [facility]) - Progression Note    Patient Details  Name: Kathy Mckay MRN: [id number] Date of Birth: Jul 11, 1932  Transition of Care [facility]) CM/SW [place], LCSW Phone Number: 11/05/2019, 11:23 AM  Clinical Narrative:   Call to [facility] with [facility]. She reported she is waiting on facility to complete Financial Review on patient. She reported she may come by today to meet with patient in her hospital room.   Attempted call to [place], no answer and phone went straight to voicemail. CSW left another voicemail requesting a return call. CSW also left voicemail for DSS Supervisor [place].     Expected Discharge Plan: [place] Barriers to Discharge: Continued Medical Work up, No SNF bed  Expected Discharge Plan and Services Expected Discharge Plan: [place] Choice: [place] arrangements for the past 2 months: [place] and [facility]) Expected Discharge Date: 10/28/19                                     Social Determinants of Health (SDOH) Interventions    Readmission Risk Interventions No flowsheet data found.

## 2019-11-05 NOTE — Progress Notes (Signed)
PROGRESS NOTE    Kathy Mckay  HEN:[id number] DOB: [birth year] DOA: 10/19/2019 PCP: Burnard Hawthorne, FNP  Brief Narrative:  86 year old lady prior history of hypertension, hyperlipidemia, depression, breast cancer, mental retardation, pernicious anemia, thrombocytopenia presents to ED with shortness of breath and cough. Chest x-ray showed bilateral patchy infiltrates left worse than the right. She was admitted for evaluation and management of healthcare associated pneumonia. Patient is from group home and is a ward of the state and has a legal guardian.    CT chest with contrast done on 10/21/2019 showed Mild bilateral posterior basilar subsegmental atelectasis or infiltrates are noted. Mild left upper lobe opacity is noted concerning for pneumonia or atelectasis. 1.5 cm rounded hypodensity is noted in posterior portion of right hepatic lobe which appears to be enlarged compared to prior exam, concerning for worsening metastatic lesion. CT abd and pelvis with contrast done on 10/21/2019 shows multiplehypodense lesions of the liver parenchyma, which are new and enlarged compared to prior examination, consistent with worsened hepatic metastatic disease. Seen by palliative care team, legal guardianship made decision for comfort care only on 10/22/2019.  10/22: Pending hospice home bed availability. 10/23: Patient was reevaluated by hospice liaison and found no longer to be appropriate for inpatient hospice home care.  Attempts to get patient back to her home care facility however they are requesting a repeat physical therapy evaluation in order to assess patient's ambulatory status.  If she is able to ambulate with use of assistant walker will discharge back to care facility with outpatient palliative following. 10/24: Patient ambulatory but not to the level that would allow her to return to group home.  Recommend skilled nursing facility placement.  TOC involved. 10/25: Communicated with TOC  and physical therapy.  Most appropriate course of action at this point would be a long-term SNF placement with hospice services. 10/26: Patient has a bed offer at [facility] in [place].  Currently pending financial review of patient.  DSS legal guardian has been difficult to reach.    Assessment & Plan:   Principal Problem:   HCAP (healthcare-associated pneumonia) Active Problems:   Hypertension   Depression   Gastroesophageal reflux disease   Malignant neoplasm of left breast in female, estrogen receptor positive ([place])   Sepsis ([place])   Thrombocytopenia ([place])   Hypokalemia  Assessment: -Sepsis secondary to HCAP, POA -Right hepatic lobe lesion measuring 1.5 cm suspicious for metastatic hepatocellular carcinoma -Breast cancer on Ibrance and tamoxifen -DNR with comfort care. -Essential hypertension -Severe protein calorie malnutrition -Thrombocytopenia, chronic -GERD -Hypokalemia -Failure to thrive in an adult -Cognitive deficits  Plan: Patient is no longer appropriate for inpatient home hospice care.  Attempts to return to group home have been unsuccessful.  Patient is ambulatory but not to the level that they require.  TOC consult for skilled nursing facility placement.  I feel patient is appropriate for long-term skilled nursing facility placement with hospice services.  Possible discharge to [facility] in ENT eval.  DSS representative and legal guardian has been difficult to reach.  TOC aware.  DVT prophylaxis: Comfort Code Status: DNR Family Communication: None  disposition Plan: Status is: Inpatient  Remains inpatient appropriate because:Unsafe d/c plan   Dispo:  Patient From: Group Home  Planned Disposition: Long-term skilled nursing facility with hospice services  Expected discharge date: 11/06/19  Medically stable for discharge: Yes  Patient is medically stable for discharge back to care home.  No longer appropriate for inpatient hospice home care.   Patient  is tolerating 50 to 70% of meals.  She is afebrile.  She is not requiring any supplemental oxygen.  Physical therapy evaluated however patient is not ambulatory to the level to group home requires.  Plan for skilled nursing facility placement.  Most appropriate for long-term skilled nursing facility placement with hospice services.        Consultants:   Palliative care  Procedures:   None  Antimicrobials:   None   Subjective: Seen and examined.  Pleasant, communicative.  No complaints.  Objective: Vitals:   11/03/19 2308 11/04/19 0752 11/04/19 2345 11/05/19 0816  BP: 118/75 138/71 117/78 126/70  Pulse: 72 72 74 79  Resp: 19 20 20 20   Temp: 97.8 F (36.6 C) 98 F (36.7 C) 98 F (36.7 C) 98.4 F (36.9 C)  TempSrc: Oral  Oral Oral  SpO2: 98% 97% 98% 96%  Weight:      Height:        Intake/Output Summary (Last 24 hours) at 11/05/2019 1239 Last data filed at 11/04/2019 1843 Gross per 24 hour  Intake 240 ml  Output --  Net 240 ml   Filed Weights   10/19/19 0929  Weight: 40.8 kg    Examination:  Limited exam due to comfort measure status  General exam: No acute distress.  Appears frail and chronically ill Respiratory system: Clear to auscultation. Respiratory effort normal. Cardiovascular system: S1-S2 heard.  No murmurs.    Data Reviewed: I have personally reviewed following labs and imaging studies  CBC: No results for input(s): WBC, NEUTROABS, HGB, HCT, MCV, PLT in the last 168 hours. Basic Metabolic Panel: No results for input(s): NA, K, CL, CO2, GLUCOSE, BUN, CREATININE, CALCIUM, MG, PHOS in the last 168 hours. GFR: Estimated Creatinine Clearance: 31.3 mL/min (by C-G formula based on SCr of 0.73 mg/dL). Liver Function Tests: No results for input(s): AST, ALT, ALKPHOS, BILITOT, PROT, ALBUMIN in the last 168 hours. No results for input(s): LIPASE, AMYLASE in the last 168 hours. No results for input(s): AMMONIA in the last 168  hours. Coagulation Profile: No results for input(s): INR, PROTIME in the last 168 hours. Cardiac Enzymes: No results for input(s): CKTOTAL, CKMB, CKMBINDEX, TROPONINI in the last 168 hours. BNP (last 3 results) No results for input(s): PROBNP in the last 8760 hours. HbA1C: No results for input(s): HGBA1C in the last 72 hours. CBG: No results for input(s): GLUCAP in the last 168 hours. Lipid Profile: No results for input(s): CHOL, HDL, LDLCALC, TRIG, CHOLHDL, LDLDIRECT in the last 72 hours. Thyroid Function Tests: No results for input(s): TSH, T4TOTAL, FREET4, T3FREE, THYROIDAB in the last 72 hours. Anemia Panel: No results for input(s): VITAMINB12, FOLATE, FERRITIN, TIBC, IRON, RETICCTPCT in the last 72 hours. Sepsis Labs: No results for input(s): PROCALCITON, LATICACIDVEN in the last 168 hours.  No results found for this or any previous visit (from the past 240 hour(s)).       Radiology Studies: No results found.      Scheduled Meds: . chlorhexidine  15 mL Mouth/Throat BID   Continuous Infusions: . sodium chloride 10 mL/hr at 10/22/19 1623     LOS: 17 days    Time spent: 15 minutes    Sidney Ace, MD [facility] Pager [phone]  If 7PM-7AM, please contact night-coverage 11/05/2019, 12:39 PM

## 2019-11-05 NOTE — Care Management Important Message (Signed)
Important Message  Patient Details  Name: JUELZ CLAAR MRN: [id number] Date of Birth: Sep 14, 1932   Medicare Important Message Given:  Other (see comment)  Called the Legal Guardian, Latanya Maudlin at [facility] [phone]) but she was not available to so left my contact information for her to call me to review the Important Message from Medicare.  Juliann Pulse A Kordell Jafri 11/05/2019, 1:17 PM

## 2019-11-05 NOTE — Progress Notes (Signed)
[facility]) Hospital Liaison RN note:  Waiting for [facility] in [place] to complete financial review. Will continue to monitor for placement.  Thank you.  Loney Laurence [facility] hospital Liaison [phone]

## 2019-11-06 NOTE — Plan of Care (Signed)
  Problem: Pain Managment: Goal: General experience of comfort will improve Outcome: Progressing   

## 2019-11-06 NOTE — Progress Notes (Signed)
PROGRESS NOTE    Kathy Mckay  LFY:[id number] DOB: 1932-05-18 DOA: 10/19/2019 PCP: Burnard Hawthorne, FNP  Brief Narrative:  86 year old lady prior history of hypertension, hyperlipidemia, depression, breast cancer, mental retardation, pernicious anemia, thrombocytopenia presents to ED with shortness of breath and cough. Chest x-ray showed bilateral patchy infiltrates left worse than the right. She was admitted for evaluation and management of healthcare associated pneumonia. Patient is from group home and is a ward of the state and has a legal guardian.    CT chest with contrast done on 10/21/2019 showed Mild bilateral posterior basilar subsegmental atelectasis or infiltrates are noted. Mild left upper lobe opacity is noted concerning for pneumonia or atelectasis. 1.5 cm rounded hypodensity is noted in posterior portion of right hepatic lobe which appears to be enlarged compared to prior exam, concerning for worsening metastatic lesion. CT abd and pelvis with contrast done on 10/21/2019 shows multiplehypodense lesions of the liver parenchyma, which are new and enlarged compared to prior examination, consistent with worsened hepatic metastatic disease. Seen by palliative care team, legal guardianship made decision for comfort care only on 10/22/2019.  10/22: Pending hospice home bed availability. 10/23: Patient was reevaluated by hospice liaison and found no longer to be appropriate for inpatient hospice home care.  Attempts to get patient back to her home care facility however they are requesting a repeat physical therapy evaluation in order to assess patient's ambulatory status.  If she is able to ambulate with use of assistant walker will discharge back to care facility with outpatient palliative following. 10/24: Patient ambulatory but not to the level that would allow her to return to group home.  Recommend skilled nursing facility placement.  TOC involved. 10/25: Communicated with TOC  and physical therapy.  Most appropriate course of action at this point would be a long-term SNF placement with hospice services. 10/26: Patient has a bed offer at [facility] in [place].  Currently pending financial review of patient.  DSS legal guardian has been difficult to reach.    Assessment & Plan:   Principal Problem:   HCAP (healthcare-associated pneumonia) Active Problems:   Hypertension   Depression   Gastroesophageal reflux disease   Malignant neoplasm of left breast in female, estrogen receptor positive ([place])   Sepsis ([place])   Thrombocytopenia ([place])   Hypokalemia  Assessment: -Sepsis secondary to HCAP, POA -Right hepatic lobe lesion measuring 1.5 cm suspicious for metastatic hepatocellular carcinoma -Breast cancer on Ibrance and tamoxifen -DNR with comfort care. -Essential hypertension -Severe protein calorie malnutrition -Thrombocytopenia, chronic -GERD -Hypokalemia -Failure to thrive in an adult -Cognitive deficits  Plan: Patient is no longer appropriate for inpatient home hospice care.  Attempts to return to group home have been unsuccessful.  Patient is ambulatory but not to the level that they require.  TOC consult for skilled nursing facility placement.  I feel patient is appropriate for long-term skilled nursing facility placement with hospice services.  Possible discharge to [facility] in ENT eval.  DSS representative and legal guardian has been difficult to reach.  TOC aware.  DVT prophylaxis: Comfort Code Status: DNR, comfort care status Family Communication:  disposition Plan: Status is: Inpatient   Dispo:  Patient From: Group Home  Planned Disposition: Long-term skilled nursing facility with hospice services  Expected discharge date: whenever bed available  Medically stable for discharge: Yes  Physical therapy evaluated however patient is not ambulatory to the level to group home requires.  Plan for skilled nursing facility placement.  Most  appropriate for long-term skilled nursing facility placement with hospice services.        Consultants:   Palliative care  Procedures:   None  Antimicrobials:   None   Subjective: Pt confused, but said "I am ok."     Objective: Vitals:   11/04/19 0752 11/04/19 2345 11/05/19 0816 11/06/19 0808  BP: 138/71 117/78 126/70 128/73  Pulse: 72 74 79 73  Resp: 20 20 20 18   Temp: 98 F (36.7 C) 98 F (36.7 C) 98.4 F (36.9 C) 97.9 F (36.6 C)  TempSrc:  Oral Oral Oral  SpO2: 97% 98% 96% 95%  Weight:      Height:        Intake/Output Summary (Last 24 hours) at 11/06/2019 1835 Last data filed at 11/06/2019 0650 Gross per 24 hour  Intake --  Output 0 ml  Net 0 ml   Filed Weights   10/19/19 0929  Weight: 40.8 kg    Examination:  Constitutional: NAD, alert, confused, appeared comfortable HEENT: conjunctivae and lids normal, EOMI CV: No cyanosis.   RESP: normal respiratory effort, on RA Extremities: No effusions, edema in BLE SKIN: warm, dry and intact Neuro: II - XII grossly intact.      Data Reviewed: I have personally reviewed following labs and imaging studies  CBC: No results for input(s): WBC, NEUTROABS, HGB, HCT, MCV, PLT in the last 168 hours. Basic Metabolic Panel: No results for input(s): NA, K, CL, CO2, GLUCOSE, BUN, CREATININE, CALCIUM, MG, PHOS in the last 168 hours. GFR: Estimated Creatinine Clearance: 31.3 mL/min (by C-G formula based on SCr of 0.73 mg/dL). Liver Function Tests: No results for input(s): AST, ALT, ALKPHOS, BILITOT, PROT, ALBUMIN in the last 168 hours. No results for input(s): LIPASE, AMYLASE in the last 168 hours. No results for input(s): AMMONIA in the last 168 hours. Coagulation Profile: No results for input(s): INR, PROTIME in the last 168 hours. Cardiac Enzymes: No results for input(s): CKTOTAL, CKMB, CKMBINDEX, TROPONINI in the last 168 hours. BNP (last 3 results) No results for input(s): PROBNP in the last 8760  hours. HbA1C: No results for input(s): HGBA1C in the last 72 hours. CBG: No results for input(s): GLUCAP in the last 168 hours. Lipid Profile: No results for input(s): CHOL, HDL, LDLCALC, TRIG, CHOLHDL, LDLDIRECT in the last 72 hours. Thyroid Function Tests: No results for input(s): TSH, T4TOTAL, FREET4, T3FREE, THYROIDAB in the last 72 hours. Anemia Panel: No results for input(s): VITAMINB12, FOLATE, FERRITIN, TIBC, IRON, RETICCTPCT in the last 72 hours. Sepsis Labs: No results for input(s): PROCALCITON, LATICACIDVEN in the last 168 hours.  No results found for this or any previous visit (from the past 240 hour(s)).       Radiology Studies: No results found.      Scheduled Meds: . chlorhexidine  15 mL Mouth/Throat BID   Continuous Infusions: . sodium chloride 10 mL/hr at 10/22/19 1623     LOS: 18 days    Enzo Bi, MD [facility] Pager [phone]  If 7PM-7AM, please contact night-coverage 11/06/2019, 6:35 PM

## 2019-11-06 NOTE — Progress Notes (Signed)
[facility]) Hospital Liaison RN note:  [facility] Liaison following for disposition. Per note from [facility], waiting on follow up calls to [facility] and [place] Representatives. Will continue to follow and update team as soon as disposition has been determined.  Thank you.  Loney Laurence [facility] Liaison [phone]

## 2019-11-06 NOTE — TOC Progression Note (Signed)
Transition of Care [facility]) - Progression Note    Patient Details  Name: Kathy Mckay MRN: [id number] Date of Birth: [birth year]  Transition of Care [facility]) CM/SW [place], LCSW Phone Number: 11/06/2019, 1:34 PM  Clinical Narrative:   Attempted call to [facility] at [facility] to follow up on if Financial Review is complete and if they can accept patient. Left a voicemail requesting follow-up today.   Attempted call to [place], [place] and [place], [place] Supervisor. Left them both another voicemail requesting a return call. Attemped call to [facility], another Sports administrator. Left her a voicemail as well.     Expected Discharge Plan: [place] Barriers to Discharge: Continued Medical Work up, No SNF bed  Expected Discharge Plan and Services Expected Discharge Plan: [place] Choice: [place] arrangements for the past 2 months: [place] [facility] [facility] and [facility]) Expected Discharge Date: 10/28/19                                     Social Determinants of Health (SDOH) Interventions    Readmission Risk Interventions No flowsheet data found.

## 2019-11-07 NOTE — Progress Notes (Signed)
[facility] Liaison note:  Notified by [facility] Oleh Genin that patient's DSS guardian has accepted the long term care  bed at the [facility], and patient will discharge today. Plan is for patient to be followed by [facility] hospice services at the [facility]. Referral updated. Thank you for the opportunity to be involved in the care of this patient.  Flo Shanks BSN, RN, [place] [phone]

## 2019-11-07 NOTE — Discharge Summary (Signed)
Physician Discharge Summary   Kathy Mckay  female DOB: [birth year]  GYJ:[id number]  PCP: Burnard Hawthorne, FNP  Admit date: 10/19/2019 Discharge date: 11/07/2019  Admitted From: group home Disposition:  SNF CODE STATUS: DNR   Hospital Course:  For full details, please see H&P, progress notes, consult notes and ancillary notes.  Briefly,  Kathy Mckay is a 86 year old lady with history of hypertension, hyperlipidemia, depression, breast cancer, mental retardation, pernicious anemia, thrombocytopenia presented to ED with shortness of breath and cough. Chest x-ray showed bilateral patchy infiltrates left worse than the right. She was admitted for evaluation and management of healthcare associated pneumonia. Patient is from group home and is a ward of the state and has a legal guardian.   CT abd and pelvis with contrast done on 10/21/2019 shows multiplehypodense lesions of the liver parenchyma, which are new and enlarged compared to prior examination, consistent with worsened hepatic metastatic disease. Seen by palliative care team, legal guardianship made decision for comfort care only on 10/22/2019.  Assessment: -Sepsis secondary to HCAP, POA -Right hepatic lobe lesion measuring 1.5 cm suspicious for metastatic hepatocellular carcinoma -Breast cancer on Ibrance and tamoxifen PTA -DNR with comfort care. -Essential hypertension -Severe protein calorie malnutrition -Thrombocytopenia, chronic -GERD -Hypokalemia -Failure to thrive in an adult -Cognitive deficits  Plan: All medications stopped.   Patient is no longer appropriate for inpatient home hospice care.  Attempts to return to group home have been unsuccessful.  Patient is ambulatory but not to the level that they require.  Pt is therefore discharged to long-term skilled nursing facility with hospice services.     Discharge Diagnoses:  Principal Problem:   HCAP (healthcare-associated pneumonia) Active  Problems:   Hypertension   Depression   Gastroesophageal reflux disease   Malignant neoplasm of left breast in female, estrogen receptor positive ([place])   Sepsis ([place])   Thrombocytopenia ([place])   Hypokalemia    Discharge Instructions:  Allergies as of 11/07/2019   No Known Allergies     Medication List    STOP taking these medications   amLODipine 10 MG tablet Commonly known as: NORVASC   atorvastatin 40 MG tablet Commonly known as: LIPITOR   esomeprazole 40 MG capsule Commonly known as: NEXIUM   Ibrance 75 MG tablet Generic drug: palbociclib   Menthol (Topical Analgesic) 4 % Gel Commonly known as: Biofreeze   mirtazapine 45 MG tablet Commonly known as: REMERON   oxyCODONE-acetaminophen 5-325 MG tablet Commonly known as: PERCOCET/ROXICET   polyethylene glycol powder 17 GM/SCOOP powder Commonly known as: GLYCOLAX/MIRALAX   prochlorperazine 10 MG tablet Commonly known as: COMPAZINE   tamoxifen 20 MG tablet Commonly known as: NOLVADEX        Follow-up Information    Burnard Hawthorne, FNP.   Specialty: Family Medicine Why: October 25 @ 11:30a Contact information: [address] Kristeen Mans [address] [place] [address] [phone]               No Known Allergies   The results of significant diagnostics from this hospitalization (including imaging, microbiology, ancillary and laboratory) are listed below for reference.   Consultations:   Procedures/Studies: DG Chest 2 View  Result Date: 10/21/2019 CLINICAL DATA:  Recent pneumonia.  History of breast carcinoma EXAM: CHEST - 2 VIEW COMPARISON:  October 19, 2019 FINDINGS: There is airspace consolidation in the left lower lobe, consistent with pneumonia there is atelectatic change in the right base. Equivocal pleural effusion on the right. There is cardiomegaly with pulmonary  vascularity within normal limits. There is aortic atherosclerosis. Bones are osteoporotic. IMPRESSION: Airspace opacifications  consistent with pneumonia left lower lobe. Right base atelectasis with somewhat equivocal right pleural effusion. Stable cardiac prominence. Bones osteoporotic. Aortic Atherosclerosis (ICD10-I70.0). Electronically Signed   By: Lowella Grip III M.D.   On: 10/21/2019 09:30   CT CHEST W CONTRAST  Result Date: 10/21/2019 CLINICAL DATA:  Cough, dyspnea. History of metastatic breast cancer. EXAM: CT CHEST WITH CONTRAST TECHNIQUE: Multidetector CT imaging of the chest was performed during intravenous contrast administration. CONTRAST:  38mL OMNIPAQUE IOHEXOL 300 MG/ML  SOLN COMPARISON:  July 16, 2019. FINDINGS: Cardiovascular: Atherosclerosis of thoracic aorta is noted without aneurysm or dissection. Mild cardiomegaly is noted. No pericardial effusion is noted. Mediastinum/Nodes: No enlarged mediastinal, hilar, or axillary lymph nodes. Thyroid gland, trachea, and esophagus demonstrate no significant findings. Lungs/Pleura: No pneumothorax is noted. Minimal right pleural effusion is noted. Mild bilateral posterior basilar subsegmental atelectasis or infiltrates are noted. Mild left upper lobe opacity is noted concerning for pneumonia or atelectasis. Upper Abdomen: 1.5 cm rounded hypodensity is noted in posterior portion of right hepatic lobe which appears to be enlarged compared to prior exam, concerning for worsening metastatic lesion. Musculoskeletal: Stable old lower thoracic compression fracture is noted. Stable old upper thoracic compression fracture is noted. Severe thoracic kyphosis is noted. No acute abnormality is noted. IMPRESSION: 1. Mild bilateral posterior basilar subsegmental atelectasis or infiltrates are noted. 2. Mild left upper lobe opacity is noted concerning for pneumonia or atelectasis. 3. Minimal right pleural effusion is noted. 4. 1.5 cm rounded hypodensity is noted in posterior portion of right hepatic lobe which appears to be enlarged compared to prior exam, concerning for worsening  metastatic lesion. 5. Stable old lower thoracic and upper thoracic compression fractures are noted. Severe thoracic kyphosis is noted. 6. Aortic atherosclerosis. Aortic Atherosclerosis (ICD10-I70.0). Electronically Signed   By: Marijo Conception M.D.   On: 10/21/2019 09:26   CT ABDOMEN PELVIS W CONTRAST  Result Date: 10/21/2019 CLINICAL DATA:  Metastatic disease evaluation, history of metastatic breast cancer EXAM: CT ABDOMEN AND PELVIS WITH CONTRAST TECHNIQUE: Multidetector CT imaging of the abdomen and pelvis was performed using the standard protocol following bolus administration of intravenous contrast. CONTRAST:  31mL OMNIPAQUE IOHEXOL 300 MG/ML  SOLN COMPARISON:  Same-day CT chest, CT abdomen pelvis, 07/16/2019 FINDINGS: Lower chest: Please see separately reported same day examination of the chest. Hepatobiliary: Multiple hypodense lesions of the liver parenchyma, which are new and enlarged compared to prior examination, the largest in the right lobe of the liver, hepatic segment VI, measuring 1.7 x 1.7 cm, previously 0.8 x 0.7 cm (series 3, image 23). A new lesion of the right lobe of the liver, just anterior to this lesion measures 0.9 x 0.8 cm (series 3, image 22). No gallstones, gallbladder wall thickening, or biliary dilatation. Pancreas: Unremarkable. No pancreatic ductal dilatation or surrounding inflammatory changes. Spleen: Normal in size without significant abnormality. Adrenals/Urinary Tract: Adrenal glands are unremarkable. Kidneys are normal, without renal calculi, solid lesion, or hydronephrosis. Bladder is unremarkable. Stomach/Bowel: Stomach is within normal limits. Appendix is not clearly visualized. No evidence of bowel wall thickening, distention, or inflammatory changes. Sigmoid diverticulosis. Vascular/Lymphatic: No significant vascular findings are present. No enlarged abdominal or pelvic lymph nodes. Reproductive: No mass or other significant abnormality. Other: No abdominal wall  hernia or abnormality. No abdominopelvic ascites. Musculoskeletal: No acute or significant osseous findings. IMPRESSION: 1. Multiple hypodense lesions of the liver parenchyma, which are new and enlarged  compared to prior examination, consistent with worsened hepatic metastatic disease. 2. No other evidence of metastatic disease in the abdomen or pelvis. 3. Sigmoid diverticulosis. Electronically Signed   By: Eddie Candle M.D.   On: 10/21/2019 19:55   DG Chest Port 1 View  Result Date: 10/19/2019 CLINICAL DATA:  Cough for several days. EXAM: PORTABLE CHEST 1 VIEW COMPARISON:  February 04, 2013 FINDINGS: The mediastinal contour is stable. The heart size is enlarged. Patchy consolidation of the bilateral lung bases, left greater than right are noted. There is no pleural effusion. The bony structures are stable. IMPRESSION: Patchy consolidation of bilateral lung bases, left greater than right, suspicious for pneumonias. Electronically Signed   By: Abelardo Diesel M.D.   On: 10/19/2019 10:32      Labs: BNP (last 3 results) Recent Labs    10/19/19 0932  BNP 016.0*   Basic Metabolic Panel: No results for input(s): NA, K, CL, CO2, GLUCOSE, BUN, CREATININE, CALCIUM, MG, PHOS in the last 168 hours. Liver Function Tests: No results for input(s): AST, ALT, ALKPHOS, BILITOT, PROT, ALBUMIN in the last 168 hours. No results for input(s): LIPASE, AMYLASE in the last 168 hours. No results for input(s): AMMONIA in the last 168 hours. CBC: No results for input(s): WBC, NEUTROABS, HGB, HCT, MCV, PLT in the last 168 hours. Cardiac Enzymes: No results for input(s): CKTOTAL, CKMB, CKMBINDEX, TROPONINI in the last 168 hours. BNP: Invalid input(s): POCBNP CBG: No results for input(s): GLUCAP in the last 168 hours. D-Dimer No results for input(s): DDIMER in the last 72 hours. Hgb A1c No results for input(s): HGBA1C in the last 72 hours. Lipid Profile No results for input(s): CHOL, HDL, LDLCALC, TRIG, CHOLHDL,  LDLDIRECT in the last 72 hours. Thyroid function studies No results for input(s): TSH, T4TOTAL, T3FREE, THYROIDAB in the last 72 hours.  Invalid input(s): FREET3 Anemia work up No results for input(s): VITAMINB12, FOLATE, FERRITIN, TIBC, IRON, RETICCTPCT in the last 72 hours. Urinalysis    Component Value Date/Time   COLORURINE AMBER (A) 10/19/2019 0932   APPEARANCEUR CLOUDY (A) 10/19/2019 0932   LABSPEC 1.011 10/19/2019 0932   PHURINE 6.0 10/19/2019 0932   GLUCOSEU NEGATIVE 10/19/2019 0932   GLUCOSEU NEGATIVE 08/12/2016 1419   HGBUR MODERATE (A) 10/19/2019 0932   BILIRUBINUR NEGATIVE 10/19/2019 0932   BILIRUBINUR negative 10/01/2015 0926   KETONESUR NEGATIVE 10/19/2019 0932   PROTEINUR 30 (A) 10/19/2019 0932   UROBILINOGEN 0.2 08/12/2016 1419   NITRITE NEGATIVE 10/19/2019 0932   LEUKOCYTESUR LARGE (A) 10/19/2019 0932   Sepsis Labs Invalid input(s): PROCALCITONIN,  WBC,  LACTICIDVEN Microbiology No results found for this or any previous visit (from the past 240 hour(s)).   Total time spend on discharging this patient, including the last patient exam, discussing the hospital stay, instructions for ongoing care as it relates to all pertinent caregivers, as well as preparing the medical discharge records, prescriptions, and/or referrals as applicable, is 30 minutes.    Enzo Bi, MD  [facility] 11/07/2019, 12:17 PM  If 7PM-7AM, please contact night-coverage

## 2019-11-07 NOTE — TOC Progression Note (Addendum)
Transition of Care [facility]) - Progression Note    Patient Details  Name: Kathy Mckay MRN: [id number] Date of Birth: 1932-11-04  Transition of Care [facility]) CM/SW [place], LCSW Phone Number: 11/07/2019, 9:21 AM  Clinical Narrative:   Reached out to [facility] with [facility] [place] again. Informed her we need to know if they can accept patient. She reported their Lexicographer said she would have an answer today.   Called APS Worker Magda Paganini who looked up and emailed Dalbert Mayotte and [place] at [place] to let them know we have been trying to reach someone since Saturday and that we need to talk to someone today for permission to send patient to SNF with hospice when bed is confirmed. Waiting to hear back.    10:58- Return message from [facility] with [facility], she reported they can accept patient for long-term SNF with hospice today. Just needs confirmation if she has received her COVID vaccines. Will need copy of vaccination card.  Return call from [facility] with [place]. Informed her of update and patient needing SNF with hospice, bed offer from [facility]. She reported that DSS will approve for her to go to [facility] today.     11:08- Updated Santiago Glad with [facility] of plan to [facility] today with hospice. Also updated MD and RN.  Called [facility], spoke to [employer] who reported patient has had both COVID vaccines and she will send me a picture of the card to send to the SNF.  Call from [facility] with [place], she agreed with Milinda Antis that patient can go to [facility] today. She reported she will be the main contact for patient at this time, her #s are [phone] and [phone]. Will inform SNF so they can reach out to her for paperwork, updates, etc.  Spoke to [facility] with [facility], room # pending.     12:13- Uploaded additional information to [facility] that  was not uploaded over the weekend. Ebony Hail at [facility] said they can take patient with [facility] still pending.  Per Ebony Hail at [facility] they can't assign a room number until they have a copy of patient's vaccine card. CSW called [place] who said she is still waiting on a staff member to send the vaciination card. She will call them now to ask them to send it again.     1:05- Patient's vaccine card was received and sent to [facility]. Per Ebony Hail, patient will be in room 103B. Updated RN and MD and asked RN to call report. Waiting on Discharge Summary.       Expected Discharge Plan: [place] Barriers to Discharge: Continued Medical Work up, No SNF bed  Expected Discharge Plan and Services Expected Discharge Plan: [place] Choice: [place] arrangements for the past 2 months: [place] [facility] and [facility]) Expected Discharge Date: 10/28/19                                     Social Determinants of Health (SDOH) Interventions    Readmission Risk Interventions No flowsheet data found.

## 2019-11-07 NOTE — TOC Transition Note (Signed)
Transition of Care [facility]) - CM/SW Discharge Note   Patient Details  Name: Kathy Mckay MRN: [id number] Date of Birth: 1932-11-24  Transition of Care [facility]) CM/SW Contact:  Magnus Ivan, LCSW Phone Number: 11/07/2019, 1:22 PM   Clinical Narrative:   Patient to discharge to [facility] with Hospice Services through [place] today. [facility] arranged for 4:30 pickup time. Patient will be in room 103B. Updated RN (and asked to call report), MD, and patient. DSS Guardian aware and approved this plan. Provided DSS Guardian's contact information to [facility] with [name]. EMS Paperwork, DNR, and Face Sheet placed in Discharge Packet by patient's chart. No additional needs identified.    Final next level of care: Skilled Nursing Facility Barriers to Discharge: Barriers Resolved   Patient Goals and CMS Choice Patient states their goals for this hospitalization and ongoing recovery are:: SNF with hospice per DSS (Legal Guardian) CMS Medicare.gov Compare Post Acute Care list provided to:: Legal Guardian Choice offered to / list presented to : [place] / [place]  Discharge Placement              Patient [facility] bed at: [facility] Patient to be transferred to facility by: [facility] Name of family member notified: DSS Legal Guardian, patient Patient and family notified of of transfer: 11/07/19  Discharge Plan and Services     Post Acute Care Choice: [place]                               Social Determinants of Health (SDOH) Interventions     Readmission Risk Interventions No flowsheet data found.

## 2019-11-07 NOTE — Care Management Important Message (Signed)
Important Message  Patient Details  Name: Kathy Mckay MRN: [id number] Date of Birth: 1932-05-14   Medicare Important Message Given:  Other (see comment)  Per Milinda Antis at [place], guardian she is agreement with the discharge plan for [name].  Juliann Pulse A Ahlana Slaydon 11/07/2019, 12:53 PM

## 2019-11-07 NOTE — Progress Notes (Signed)
Called report to Dominica at [facility]. Answered all questions. Transport arrangements made by SW for approx 4:30pm

## 2019-11-08 DIAGNOSIS — C787 Secondary malignant neoplasm of liver and intrahepatic bile duct: Secondary | ICD-10-CM | POA: Diagnosis not present

## 2019-11-08 DIAGNOSIS — I10 Essential (primary) hypertension: Secondary | ICD-10-CM | POA: Diagnosis not present

## 2019-11-08 DIAGNOSIS — R5381 Other malaise: Secondary | ICD-10-CM | POA: Diagnosis not present

## 2019-11-08 DIAGNOSIS — C50919 Malignant neoplasm of unspecified site of unspecified female breast: Secondary | ICD-10-CM | POA: Diagnosis not present

## 2019-11-15 DIAGNOSIS — C50919 Malignant neoplasm of unspecified site of unspecified female breast: Secondary | ICD-10-CM | POA: Diagnosis not present

## 2019-11-15 DIAGNOSIS — I10 Essential (primary) hypertension: Secondary | ICD-10-CM | POA: Diagnosis not present

## 2019-11-15 DIAGNOSIS — R5381 Other malaise: Secondary | ICD-10-CM | POA: Diagnosis not present

## 2019-11-15 DIAGNOSIS — C787 Secondary malignant neoplasm of liver and intrahepatic bile duct: Secondary | ICD-10-CM | POA: Diagnosis not present

## 2019-11-22 DIAGNOSIS — R5381 Other malaise: Secondary | ICD-10-CM | POA: Diagnosis not present

## 2019-11-22 DIAGNOSIS — C50919 Malignant neoplasm of unspecified site of unspecified female breast: Secondary | ICD-10-CM | POA: Diagnosis not present

## 2019-11-22 DIAGNOSIS — I10 Essential (primary) hypertension: Secondary | ICD-10-CM | POA: Diagnosis not present

## 2019-11-28 DIAGNOSIS — R5381 Other malaise: Secondary | ICD-10-CM | POA: Diagnosis not present

## 2019-11-28 DIAGNOSIS — C787 Secondary malignant neoplasm of liver and intrahepatic bile duct: Secondary | ICD-10-CM | POA: Diagnosis not present

## 2019-11-28 DIAGNOSIS — I10 Essential (primary) hypertension: Secondary | ICD-10-CM | POA: Diagnosis not present

## 2019-11-29 DIAGNOSIS — C50919 Malignant neoplasm of unspecified site of unspecified female breast: Secondary | ICD-10-CM | POA: Diagnosis not present

## 2019-11-29 DIAGNOSIS — C787 Secondary malignant neoplasm of liver and intrahepatic bile duct: Secondary | ICD-10-CM | POA: Diagnosis not present

## 2019-11-29 DIAGNOSIS — R5381 Other malaise: Secondary | ICD-10-CM | POA: Diagnosis not present

## 2019-11-29 DIAGNOSIS — I10 Essential (primary) hypertension: Secondary | ICD-10-CM | POA: Diagnosis not present

## 2019-12-03 IMAGING — MG MM BREAST LOCALIZATION CLIP
5 series · 5 of 5 positions shown · non-contrast
Comparison: Previous exam(s).

CLINICAL DATA: Status post ultrasound-guided core biopsies of the
left breast and left axilla.

EXAM:
DIAGNOSTIC LEFT MAMMOGRAM POST ULTRASOUND BIOPSIES

[L CC (1 of 2)]
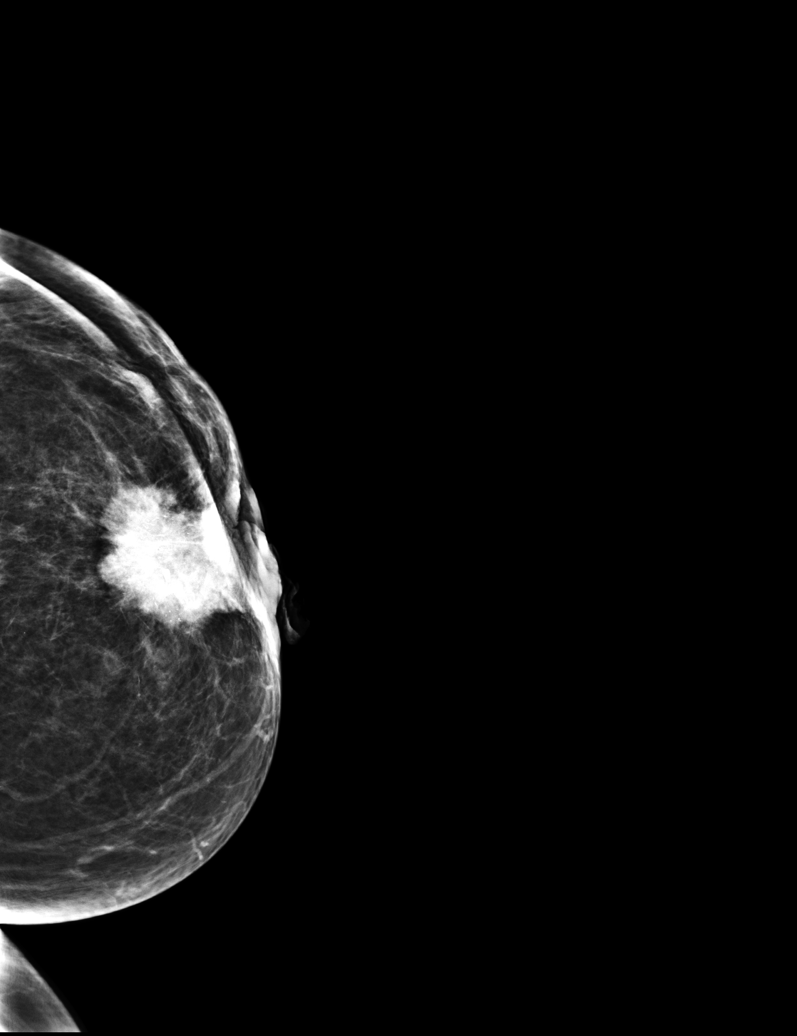

[L LM (1 of 2)]
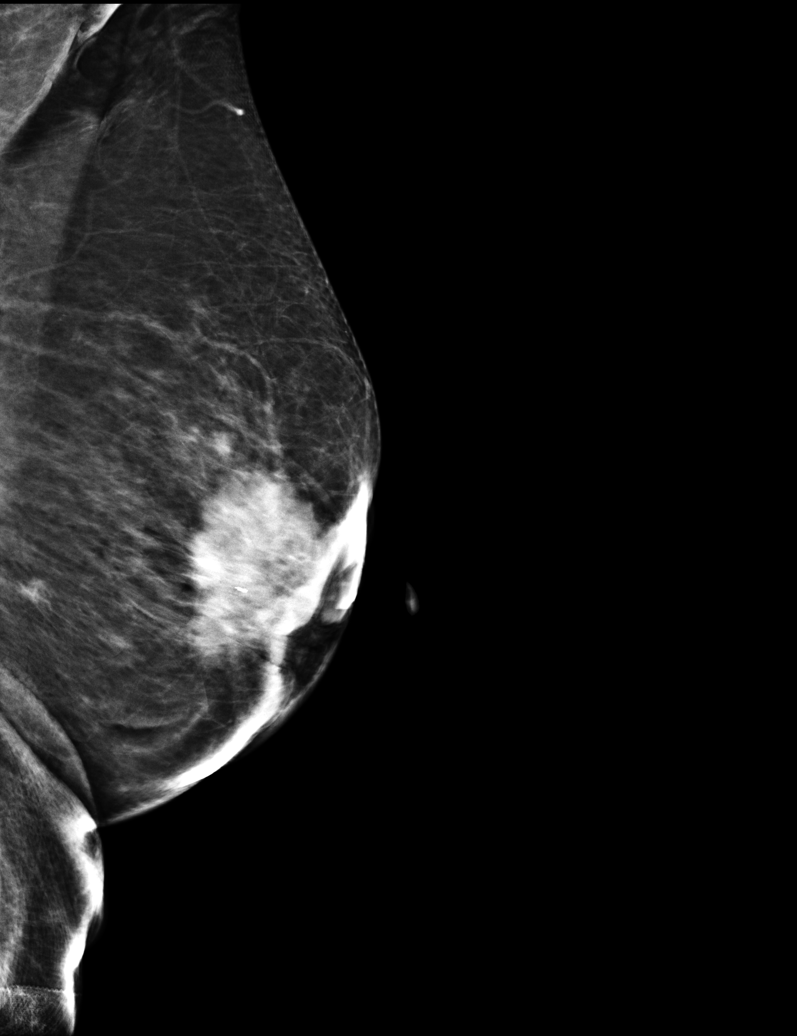

[L LM (2 of 2)]
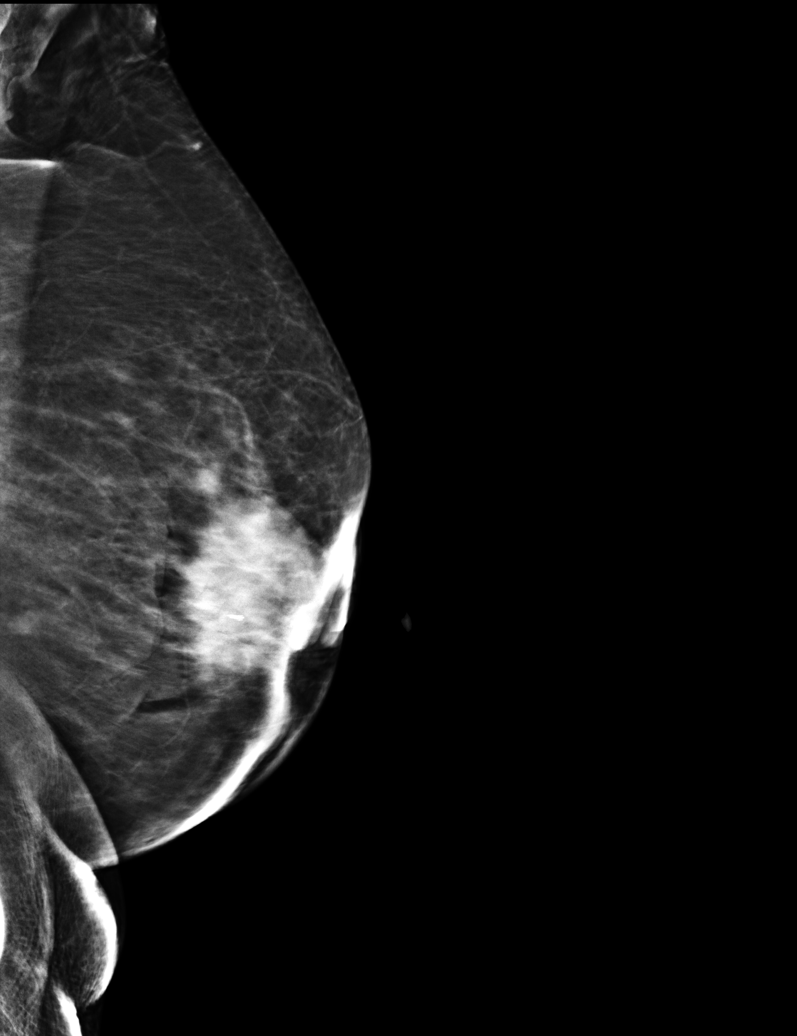

[L MLO]
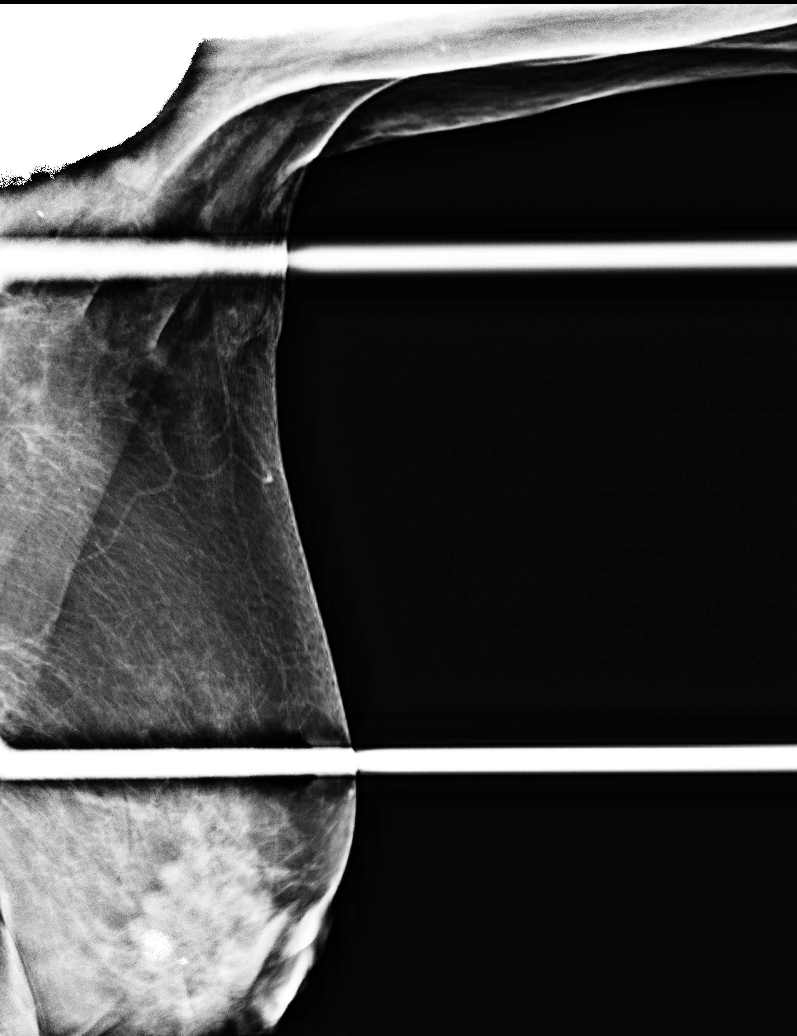

[L CC (2 of 2)]
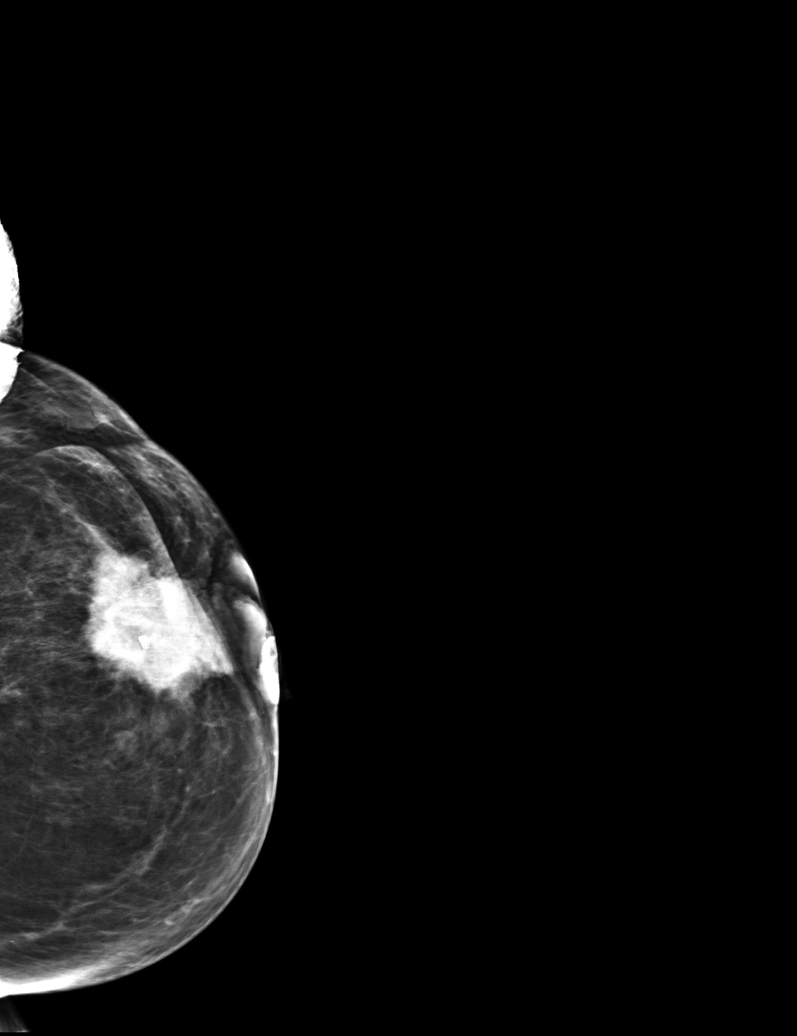

[5 of 5 positions shown; findings below may reference images not displayed]

FINDINGS: Mammographic images were obtained following ultrasound-guided core
biopsies of a left breast mass and a left axillary lymph node.
Mammographic images show there is a heart shaped clip in the mass in
the retroareolar region of the left breast. Multiple attempts at an
axillary view were obtained. A single marker clip is noted in the
left axilla. The second marker clip is likely not visualized
secondary to its posterior location.
IMPRESSION: Status post ultrasound-guided core biopsies of the left breast and
left axilla with pathology pending.

Final Assessment: Post Procedure Mammograms for Marker Placement

## 2019-12-06 DIAGNOSIS — R5381 Other malaise: Secondary | ICD-10-CM | POA: Diagnosis not present

## 2019-12-06 DIAGNOSIS — C50919 Malignant neoplasm of unspecified site of unspecified female breast: Secondary | ICD-10-CM | POA: Diagnosis not present

## 2019-12-06 DIAGNOSIS — C787 Secondary malignant neoplasm of liver and intrahepatic bile duct: Secondary | ICD-10-CM | POA: Diagnosis not present

## 2019-12-06 DIAGNOSIS — I10 Essential (primary) hypertension: Secondary | ICD-10-CM | POA: Diagnosis not present

## 2019-12-17 ENCOUNTER — Inpatient Hospital Stay

## 2019-12-17 ENCOUNTER — Inpatient Hospital Stay: Admitting: Oncology

## 2020-01-06 DIAGNOSIS — I10 Essential (primary) hypertension: Secondary | ICD-10-CM | POA: Diagnosis not present

## 2020-01-06 DIAGNOSIS — D649 Anemia, unspecified: Secondary | ICD-10-CM | POA: Diagnosis not present

## 2020-01-07 DIAGNOSIS — I10 Essential (primary) hypertension: Secondary | ICD-10-CM | POA: Diagnosis not present

## 2020-01-07 DIAGNOSIS — C50919 Malignant neoplasm of unspecified site of unspecified female breast: Secondary | ICD-10-CM | POA: Diagnosis not present

## 2020-01-07 DIAGNOSIS — C787 Secondary malignant neoplasm of liver and intrahepatic bile duct: Secondary | ICD-10-CM | POA: Diagnosis not present

## 2020-01-07 DIAGNOSIS — R5381 Other malaise: Secondary | ICD-10-CM | POA: Diagnosis not present

## 2020-01-19 DIAGNOSIS — S51819A Laceration without foreign body of unspecified forearm, initial encounter: Secondary | ICD-10-CM | POA: Diagnosis not present

## 2020-02-04 DIAGNOSIS — C787 Secondary malignant neoplasm of liver and intrahepatic bile duct: Secondary | ICD-10-CM | POA: Diagnosis not present

## 2020-02-04 DIAGNOSIS — C50919 Malignant neoplasm of unspecified site of unspecified female breast: Secondary | ICD-10-CM | POA: Diagnosis not present

## 2020-02-04 DIAGNOSIS — I10 Essential (primary) hypertension: Secondary | ICD-10-CM | POA: Diagnosis not present

## 2020-03-04 DIAGNOSIS — C787 Secondary malignant neoplasm of liver and intrahepatic bile duct: Secondary | ICD-10-CM | POA: Diagnosis not present

## 2020-03-04 DIAGNOSIS — I10 Essential (primary) hypertension: Secondary | ICD-10-CM | POA: Diagnosis not present

## 2020-03-04 DIAGNOSIS — R5381 Other malaise: Secondary | ICD-10-CM | POA: Diagnosis not present

## 2020-03-04 DIAGNOSIS — C50919 Malignant neoplasm of unspecified site of unspecified female breast: Secondary | ICD-10-CM | POA: Diagnosis not present

## 2020-04-10 ENCOUNTER — Other Ambulatory Visit (HOSPITAL_COMMUNITY): Payer: Self-pay

## 2020-06-03 IMAGING — MG US BREAST BX W LOC DEV 1ST LESION IMG BX SPEC US GUIDE*L*
1 series · 8 of 8 positions shown · non-contrast
Comparison: Previous exam(s).

ADDENDUM:
Pathology of the left breast biopsy revealed A. LEFT BREAST, [DATE]
RETROAREOLAR; ULTRASOUND-GUIDED BIOPSY: INVASIVE MAMMARY CARCINOMA,
NO SPECIAL TYPE. Size of invasive carcinoma: 17 mm in this sample.
Histologic grade of invasive carcinoma: Grade 2. Ductal carcinoma in
situ: Not identified. Lymphovascular invasion: Present.

Pathology of the left axillary lymph node revealed B. LYMPH NODE,
LEFT AXILLA; ULTRASOUND-GUIDED BIOPSY: METASTATIC CARCINOMA.
EXTRACAPSULAR EXTENSION IS PRESENT. METASTATIC CARCINOMA MEASURES 6
MM IN THE SPECIMEN. ER/PR/HER2: Immunohistochemistry will be
performed on block A1, with reflex to FISH for HER2 2+. The results
will be reported in an addendum. Comment: The definitive grade will
be assigned on the excisional specimen. These findings were
communicated to Csepeli in Dr. [REDACTED] on 07/14/2017. Read
back procedure was performed.
This was found to be concordant by Dr. Chaka.
Recommendation: Surgical and oncology referrals.
Results and recommendations were relayed to Joshjax, nurse for
[place], NP on 07/14/17. The provider is out of the office
that afternoon. Leonela Jacobsen ask about the referral on [REDACTED] and
notify Myisha Leslie if they will make the referral or request
the nurse navigators to make the referral.
At the patient's request, results were relayed to the patient's
caregiver, [place], Nilda Estela by phone by Dr. Jedediah Ussery on 07/14/17. The patient
did well following the biopsy. All of their questions were answered.
They will be contacted regarding referral after the provider has
reviewed the results.
Joshjax, nurse for [place], NP called on 07/17/17 and stated
that their office will make the referral.
Addendum by Leonela Jacobsen on 07/18/17.
CLINICAL DATA: Suspicious left breast mass and axillary adenopathy.
EXAM:
ULTRASOUND GUIDED CORE BIOPSIES OF THE LEFT BREAST AND LEFT AXILLA.

[Series 1: MG view · 0.07mm/px · 8 of 15 slices shown]
[im 1/15]
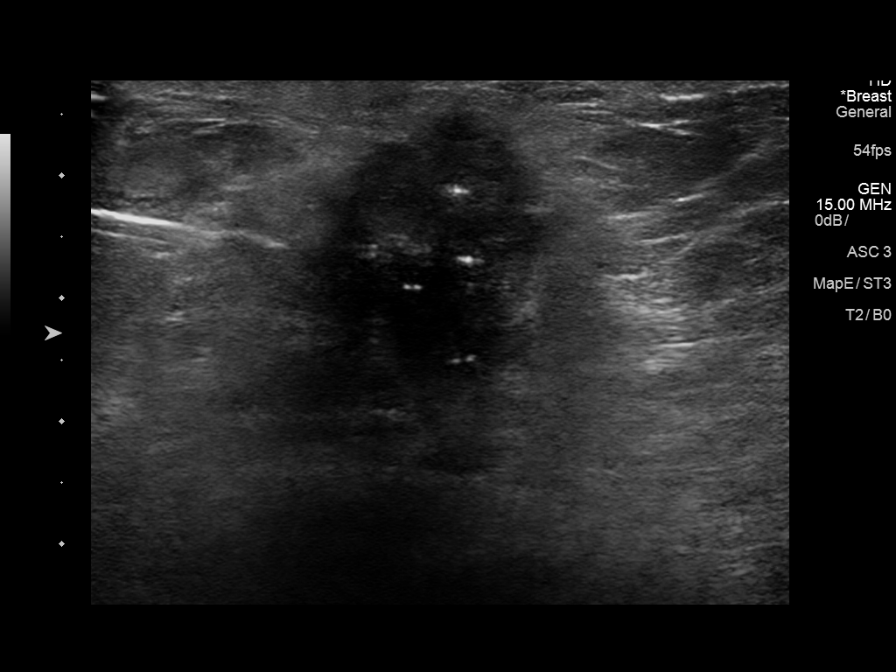
[im 3/15]
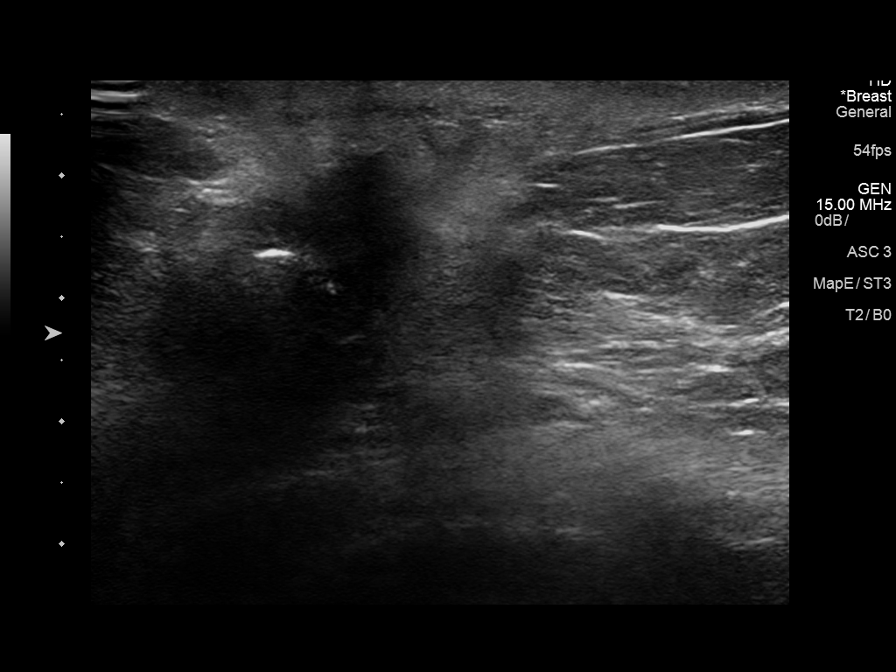
[im 5/15]
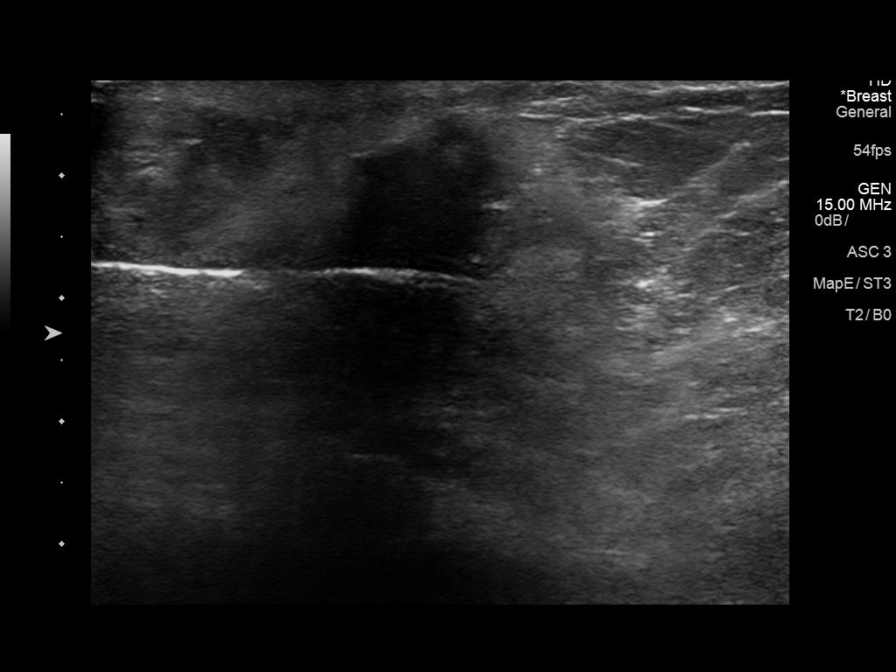
[im 7/15]
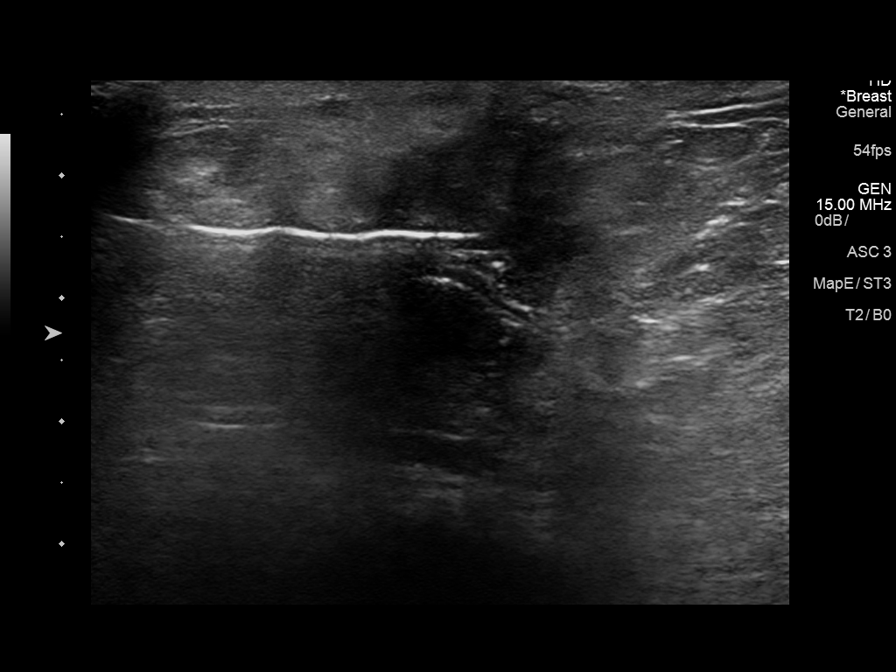
[im 9/15]
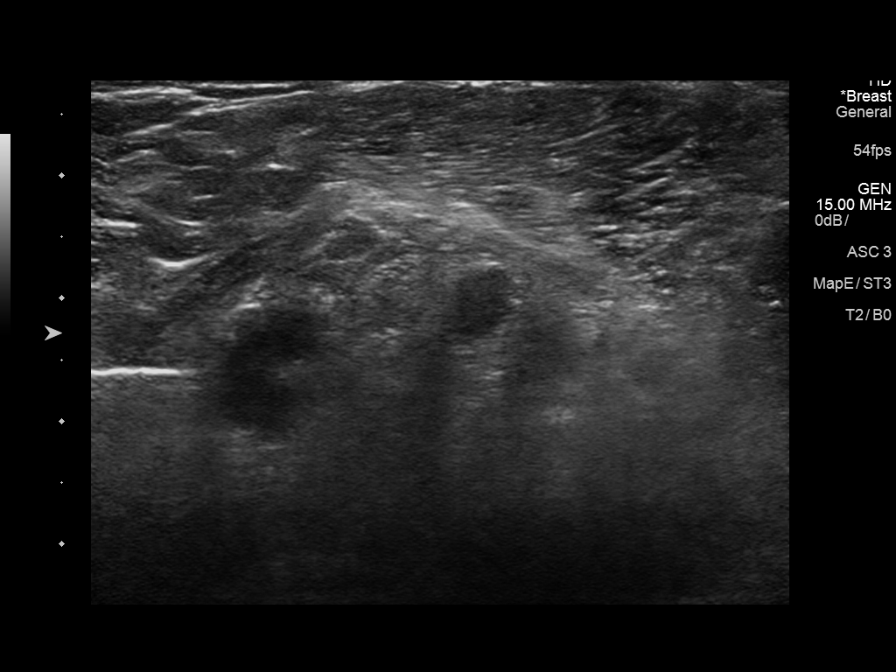
[im 11/15]
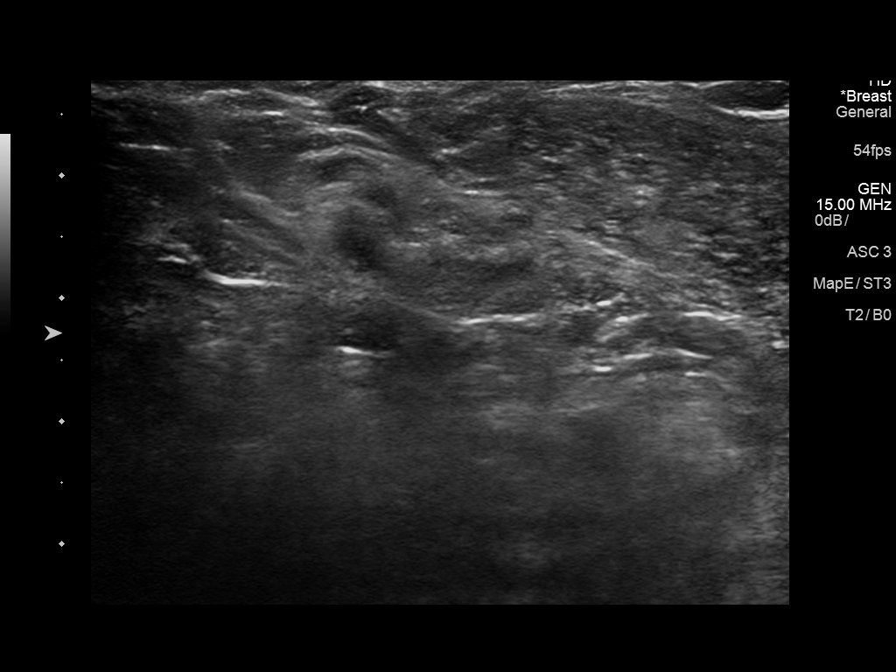
[im 13/15]
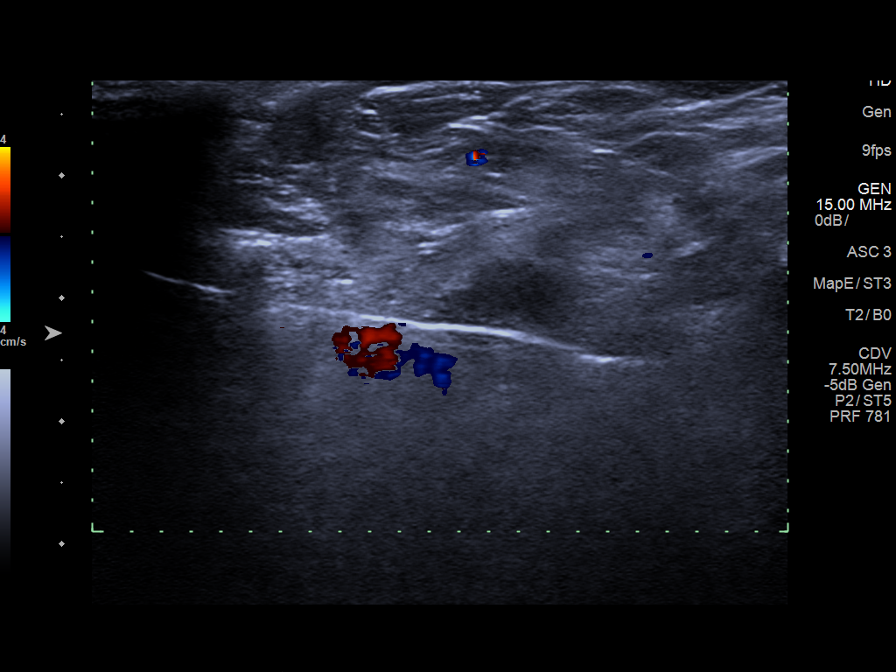
[im 15/15]
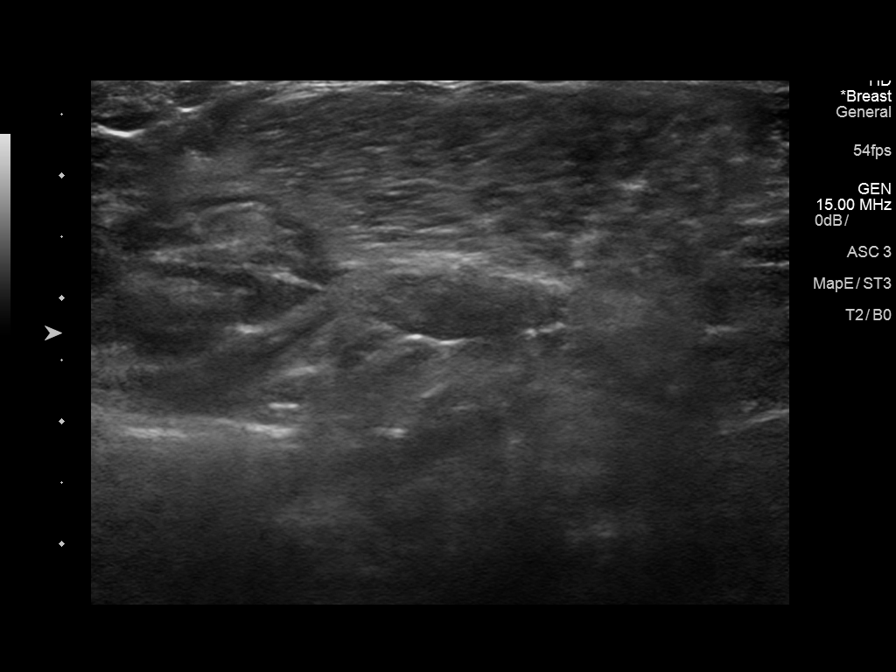

[8 of 8 positions shown; findings below may reference images not displayed]



Lesion quadrant: 3 o'clock region of the left breast.

Using sterile technique and 1% Lidocaine and 1% lidocaine with
epinephrine as local anesthetic, under direct ultrasound
visualization, a 14 gauge Pastore, Nilda Estela device was used to perform
biopsy of a mass in the 3 o'clock subareolar region of the left
breast using a lateral to medial approach. At the conclusion of the
procedure a heart shaped tissue marker clip was deployed into the
biopsy cavity. Follow up 2 view mammogram was performed and dictated
separately.

I met with the patient and we discussed the procedure of
ultrasound-guided biopsy, including benefits and alternatives. We
discussed the high likelihood of a successful procedure. We
discussed the risks of the procedure, including infection, bleeding,
tissue injury, clip migration, and inadequate sampling. Informed
written consent was given. The usual time-out protocol was performed
immediately prior to the procedure.

Lesion quadrant: Left axilla

Using sterile technique and 1% Lidocaine and 1% lidocaine with
epinephrine as local anesthetic, under direct ultrasound
visualization, a 14 gauge Roxana Del Valle device was used to perform
biopsy of a left axillary lymph node using an inferior to superior
approach. At the conclusion of the procedure a HydroMARK clip was
placed in the left axilla but felt to be adjacent to the lymph node
and not in the lymph node and a second coil shaped clip was deployed
into the biopsy cavity. Follow up 2 view mammogram was performed and
dictated separately.
IMPRESSION: Ultrasound guided biopsies of the left breast and left axilla. No
apparent complications.

## 2020-10-10 DEATH — deceased

## 2023-01-12 NOTE — Telephone Encounter (Signed)
 Error
